# Patient Record
Sex: Male | Born: 1950 | Race: White | Hispanic: No | Smoking: Never smoker
Health system: Southern US, Community
[De-identification: ages and names within clinical notes are randomized; demographics above are authoritative.]

## PROBLEM LIST (undated history)

## (undated) ENCOUNTER — Inpatient Hospital Stay: Admission: EM | Payer: Self-pay | Source: Home / Self Care

## (undated) DIAGNOSIS — C4490 Unspecified malignant neoplasm of skin, unspecified: Secondary | ICD-10-CM

## (undated) DIAGNOSIS — Z87442 Personal history of urinary calculi: Secondary | ICD-10-CM

## (undated) DIAGNOSIS — E119 Type 2 diabetes mellitus without complications: Secondary | ICD-10-CM

## (undated) DIAGNOSIS — M1990 Unspecified osteoarthritis, unspecified site: Secondary | ICD-10-CM

## (undated) DIAGNOSIS — Z923 Personal history of irradiation: Secondary | ICD-10-CM

## (undated) DIAGNOSIS — M5126 Other intervertebral disc displacement, lumbar region: Secondary | ICD-10-CM

## (undated) DIAGNOSIS — I10 Essential (primary) hypertension: Secondary | ICD-10-CM

## (undated) DIAGNOSIS — K5792 Diverticulitis of intestine, part unspecified, without perforation or abscess without bleeding: Secondary | ICD-10-CM

## (undated) HISTORY — PX: OTHER SURGICAL HISTORY: SHX169

## (undated) HISTORY — DX: Personal history of irradiation: Z92.3

## (undated) HISTORY — PX: MOUTH SURGERY: SHX715

## (undated) NOTE — *Deleted (*Deleted)
HEMATOLOGY/ONCOLOGY CONSULTATION NOTE  Date of Service: 01/23/2020  Patient Care Team: Clovis Riley, L.August Saucer, MD as PCP - General (Family Medicine)  CHIEF COMPLAINTS/PURPOSE OF CONSULTATION:  Primary Testicular large B cell lymphoma   HISTORY OF PRESENTING ILLNESS:   Patrick Bautista is a wonderful 16 y.o. male who has been referred to [place] by Dr. Marlou Porch for evaluation and management of Diffuse Large B-Cell Lymphoma. Pt is accompanied today by his girlfriend, Patrick Bautista. The pt reports that he is doing well overall.    Pt received his second COVID19 vaccine on 02/13. Soon after he began having sweating in his groin, numbness in his left thigh, and numbness in two fingers on his left hand. After the symptoms began pt went to see his PCP who thought he had a hydrocele. He returned a month later, with continued symptoms, and received a testicular US which found a mass. Pt had a left Orchiectomy performed on 09/03/2019. The pt reports that he saw Dr. Marlou Porch this morning and has been healing well from his surgery.   His Diabetes has been relatively well-controlled with diet and Glimepiride. It has been many years since he has had concerns with Diverticulitis and last had kidney stones in 2017. He has previously been seen by a Nephrologist who felt that his kidney disease could have been partially caused by Cystoscopy with Dr. Marlou Porch in 2018. Pt has no history of lung or heart dysfunction or neuropathy.   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   Of note prior to the patient's visit today, pt has had Left Testicle and Spermatic Cord Surgical Pathology ([id number]) completed on 09/03/2019 with results revealing "Diffuse large B-cell lymphoma."   Pt has had CT C/A/P completed on 08/26/2019 with results revealing "1. Large left-sided testicular mass consistent with known testicular cancer. 2. No findings for metastatic disease involving the chest, abdomen, or pelvis.  3. A few small scattered retroperitoneal lymph nodes but no mass or overt adenopathy. "  Most recent lab results (09/01/2019) of CBC w/diff and CMP is as follows: all values are WNL except for Hgb at 12.7, Glucose at 177, BUN at 50, Creatinine at 1.91, GFR Est Non Af Am at 41.  On review of systems, pt reports anxiety, stress and denies neuropathy, right testicular pain/swelling, abdominal pain and any other symptoms.   On PMHx the pt reports HTN, Diabetes Type II, Diverticulitis, Arthritis, Lumbar herniated disc, Left Orchiectomy. On Social Hx the pt reports that he is a retired Insurance underwriter.   INTERVAL HISTORY:  Patrick Bautista is a wonderful 37 y.o. 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 The patient's last visit with [place] was on 01/02/2020. The pt reports that he is doing well overall.  The pt reports ***  Of note since the patient's last visit, pt has had *** completed on *** with results revealing ***.  Lab results today (01/23/20) of CBC w/diff and CMP is as follows: all values are WNL except for ***. 01/23/2020 Magnesium at ***  On review of systems, pt reports *** and denies ***and any other symptoms.   A&P: -Discussed pt labwork today, 01/23/20; *** -***  MEDICAL HISTORY:  Past Medical History:  Diagnosis Date  . Arthritis   . Diabetes mellitus without complication (HCC)    type 2  . Diverticulitis 15 yrs ago  . History of kidney stones    right ureteral stone and one in 2017  .  Hypertension   . Lumbar herniated disc    L 4 L 5     SURGICAL HISTORY: Past Surgical History:  Procedure Laterality Date  . colonscopy  15 yrs ago  . CYSTOSCOPY/URETEROSCOPY/HOLMIUM LASER/STENT PLACEMENT Right 02/16/2017   Procedure: CYSTOSCOPY/RETROGRADE/URETEROSCOPY/HOLMIUM LASER/STENT PLACEMENT;  Surgeon: Crist Fat, MD;  Location: [facility];  Service: Urology;  Laterality: Right;  NEEDS 60 MIN FOR PROCEDURE  . IR IMAGING GUIDED PORT  INSERTION  09/19/2019  . IR REMOVAL TUN ACCESS W/ PORT W/O FL MOD SED  01/19/2020  . MOUTH SURGERY  20 yrs ago  . ORCHIECTOMY Left 09/03/2019   Procedure: LEFT ORCHIECTOMY;  Surgeon: Crist Fat, MD;  Location: [facility];  Service: Urology;  Laterality: Left;    SOCIAL HISTORY: Social History   Socioeconomic History  . Marital status: Legally Separated    Spouse name: Not on file  . Number of children: Not on file  . Years of education: Not on file  . Highest education level: Not on file  Occupational History  . Not on file  Tobacco Use  . Smoking status: Never Smoker  . Smokeless tobacco: Never Used  Vaping Use  . Vaping Use: Never used  Substance and Sexual Activity  . Alcohol use: No  . Drug use: No  . Sexual activity: Not on file  Other Topics Concern  . Not on file  Social History Narrative   Pt states "Potential reaction after covid vaccination" States "Experienced left fingers numb and left inner thigh numb, left testicular sweating, all within 5 hours of vaccination".   Social Determinants of Health   Financial Resource Strain:   . Difficulty of Paying Living Expenses: Not on file  Food Insecurity:   . Worried About Programme researcher, broadcasting/film/video in the Last Year: Not on file  . Ran Out of Food in the Last Year: Not on file  Transportation Needs:   . Lack of Transportation (Medical): Not on file  . Lack of Transportation (Non-Medical): Not on file  Physical Activity:   . Days of Exercise per Week: Not on file  . Minutes of Exercise per Session: Not on file  Stress:   . Feeling of Stress : Not on file  Social Connections:   . Frequency of Communication with Friends and Family: Not on file  . Frequency of Social Gatherings with Friends and Family: Not on file  . Attends Religious Services: Not on file  . Active Member of Clubs or Organizations: Not on file  . Attends Banker Meetings: Not on file  . Marital Status: Not on file  Intimate Partner  Violence:   . Fear of Current or Ex-Partner: Not on file  . Emotionally Abused: Not on file  . Physically Abused: Not on file  . Sexually Abused: Not on file    FAMILY HISTORY: No family history on file.  ALLERGIES:  is allergic to hydrocodone, percocet [oxycodone-acetaminophen], codeine, penicillins, and tape.  MEDICATIONS:  Current Outpatient Medications  Medication Sig Dispense Refill  . acetaminophen (TYLENOL) 500 MG tablet Take 500-1,000 mg by mouth every 6 (six) hours as needed for moderate pain.     Marland Kitchen amLODipine (NORVASC) 2.5 MG tablet Take 1 tablet (2.5 mg total) by mouth daily. 2.5 mg + 5 mg =7.5 mg total daily dose    . amLODipine (NORVASC) 5 MG tablet Take 1 tablet (5 mg total) by mouth daily. 2.5 mg + 5 mg =7.5 mg total daily dose  11  .  B-D UF III MINI PEN NEEDLES 31G X 5 MM MISC Inject into the skin 4 (four) times daily.    . benzonatate (TESSALON) 100 MG capsule Take 1 capsule (100 mg total) by mouth 3 (three) times daily for 5 days. 15 capsule 0  . citalopram (CELEXA) 10 MG tablet TAKE 1 TABLET BY MOUTH EVERY DAY (Patient not taking: Reported on 01/16/2020) 90 tablet 1  . ergocalciferol (VITAMIN D2) 1.25 MG (50000 UT) capsule Take 1 capsule (50,000 Units total) by mouth 2 (two) times a week. 24 capsule 2  . fluocinonide cream (LIDEX) 0.05 % Apply 1 application topically 2 (two) times daily as needed (to access port).     Marland Kitchen guaiFENesin-dextromethorphan (ROBITUSSIN DM) 100-10 MG/5ML syrup Take 5 mLs by mouth every 4 (four) hours as needed for cough. 118 mL 0  . HUMALOG KWIKPEN 100 UNIT/ML KwikPen Inject 1-5 Units into the skin See admin instructions. Sliding scale : up to 4 times a day.    . lidocaine-prilocaine (EMLA) cream Apply to affected area once (Patient not taking: Reported on 01/16/2020) 30 g 3  . loratadine (CLARITIN) 10 MG tablet Take 1 tablet (10 mg total) by mouth See admin instructions. Patient states he takes for 7 days: from 3rd day post chemo until 10th day post  chemo (Patient taking differently: Take 10 mg by mouth daily. ) 28 tablet 0  . LORazepam (ATIVAN) 0.5 MG tablet TAKE 1 TABLET BY MOUTH EVERY 6 HOURS AS NEEDED (NAUSEA OR VOMITING). (Patient taking differently: Take 0.5 mg by mouth every 6 (six) hours as needed for anxiety. ) 30 tablet 0  . [START ON 01/28/2020] olmesartan-hydrochlorothiazide (BENICAR HCT) 40-25 MG tablet Take 1 tablet by mouth daily.    Letta Pate VERIO test strip 2 (two) times daily.    . pantoprazole (PROTONIX) 40 MG tablet TAKE 1 TABLET BY MOUTH DAILY BEFORE BREAKFAST (Patient taking differently: Take 40 mg by mouth daily. ) 90 tablet 1  . prochlorperazine (COMPAZINE) 10 MG tablet Take 1 tablet (10 mg total) by mouth every 6 (six) hours as needed (Nausea or vomiting). (Patient not taking: Reported on 01/16/2020) 30 tablet 6  . senna-docusate (SENNA S) 8.6-50 MG tablet Take 2 tablets by mouth at bedtime as needed for mild constipation or moderate constipation. 60 tablet 1  . TRESIBA FLEXTOUCH 100 UNIT/ML FlexTouch Pen Inject 40 Units into the skin daily.      No current facility-administered medications for this visit.    REVIEW OF SYSTEMS:   A 10+ POINT REVIEW OF SYSTEMS WAS OBTAINED including neurology, dermatology, psychiatry, cardiac, respiratory, lymph, extremities, GI, GU, Musculoskeletal, constitutional, breasts, reproductive, HEENT.  All pertinent positives are noted in the HPI.  All others are negative.    PHYSICAL EXAMINATION: ECOG PERFORMANCE STATUS: 0 - Asymptomatic  . There were no vitals filed for this visit. There were no vitals filed for this visit. .There is no height or weight on file to calculate BMI.   *** GENERAL:alert, in no acute distress and comfortable SKIN: no acute rashes, no significant lesions EYES: conjunctiva are pink and non-injected, sclera anicteric OROPHARYNX: MMM, no exudates, no oropharyngeal erythema or ulceration NECK: supple, no JVD LYMPH:  no palpable lymphadenopathy in the  cervical, axillary or inguinal regions LUNGS: clear to auscultation b/l with normal respiratory effort HEART: regular rate & rhythm ABDOMEN:  normoactive bowel sounds , non tender, not distended. No palpable hepatosplenomegaly.  Extremity: no pedal edema PSYCH: alert & oriented x 3 with fluent speech NEURO:  no focal motor/sensory deficits  LABORATORY DATA:  I have reviewed the data as listed  . CBC Latest Ref Rng & Units 01/21/2020 01/20/2020 01/19/2020  WBC 4.0 - 10.5 K/uL 9.1 8.5 9.3  Hemoglobin 13.0 - 17.0 g/dL 8.3(L) 8.4(L) 8.7(L)  Hematocrit 39 - 52 % 25.5(L) 26.0(L) 25.5(L)  Platelets 150 - 400 K/uL 457(H) 426(H) 349    . CMP Latest Ref Rng & Units 01/21/2020 01/20/2020 01/19/2020  Glucose 70 - 99 mg/dL 161(W) 960(A) 93  BUN 8 - 23 mg/dL 11 15 13   Creatinine 0.61 - 1.24 mg/dL 5.40 9.81 1.91  Sodium 135 - 145 mmol/L 135 133(L) 138  Potassium 3.5 - 5.1 mmol/L 3.8 3.6 3.9  Chloride 98 - 111 mmol/L 104 104 107  CO2 22 - 32 mmol/L 23 23 24   Calcium 8.9 - 10.3 mg/dL 4.7(W) 8.3(L) 8.5(L)  Total Protein 6.5 - 8.1 g/dL 2.9(F) 5.6(L) 5.6(L)  Total Bilirubin 0.3 - 1.2 mg/dL 0.3 0.4 0.4  Alkaline Phos 38 - 126 U/L 55 61 58  AST 15 - 41 U/L 42(H) 41 23  ALT 0 - 44 U/L 31 27 17     12/08/2019 Cytology Report ([id number]):   09/03/2019 Left Testicle and Spermatic Cord Surgical Pathology ([id number]) :    RADIOGRAPHIC STUDIES: I have personally reviewed the radiological images as listed and agreed with the findings in the report. DG Chest 2 View  Result Date: 01/16/2020 CLINICAL DATA:  Lymphoma with cough and recurrent fever EXAM: CHEST - 2 VIEW COMPARISON:  Chest CT 08/26/2019 FINDINGS: Normal heart size and mediastinal contours. Porta catheter on the right with tip at the SVC. There is no edema, consolidation, effusion, or pneumothorax. Spondylosis. IMPRESSION: No evidence of acute disease. Electronically Signed   By: Marnee Spring M.D.   On: 01/16/2020 10:17   IR REMOVAL  TUN ACCESS W/ PORT W/O FL MOD SED  Result Date: 01/19/2020 INDICATION: 58 year old male with a history of un known fever origin referred for port removal EXAM: IMPLANTED PORT A CATH PLACEMENT WITH ULTRASOUND AND FLUOROSCOPIC GUIDANCE MEDICATIONS: 1 g vancomycin; The antibiotic was administered within an appropriate time interval prior to skin puncture. ANESTHESIA/SEDATION: Moderate (conscious) sedation was employed during this procedure. A total of Versed 1.0 mg and Fentanyl 50 mcg was administered intravenously. Moderate Sedation Time: 10 minutes. The patient's level of consciousness and vital signs were monitored continuously by radiology nursing throughout the procedure under my direct supervision. FLUOROSCOPY TIME:  None COMPLICATIONS: None PROCEDURE: Informed consent was obtained from the patient following an explanation of the procedure, risks, benefits and alternatives. The patient understands, agrees and consents for the procedure. All questions were addressed. A time out was performed prior to the initiation of the procedure. The patient was positioned in the operation suite in the supine position on a gantry. The previous scar on the right chest was generously infiltrated with 1% lidocaine for local anesthesia. Infiltration of the skin and subcutaneous tissues surrounding the port was performed. Using sharp and blunt dissection, the port apparatus and subcutaneous catheter were removed in their entirety. There was no erythema or purulent fluid. Given the appearance, primary closure was elected. The port pocket was then closed with interrupted Vicryl layer and a running subcuticular with 4-0 Monocryl. The skin was sealed with Derma bond. A sterile dressing was placed. The patient tolerated the procedure well and remained hemodynamically stable throughout. No complications were encountered and no significant blood loss was encountered. IMPRESSION: Status post bedside right IJ port catheter removal. Signed,  Yvone Neu. Reyne Dumas, RPVI [facility] Radiology Electronically Signed   By: Gilmer Mor D.O.   On: 01/19/2020 15:47   DG CHEST PORT 1 VIEW  Result Date: 01/17/2020 CLINICAL DATA:  Shortness of breath. EXAM: PORTABLE CHEST 1 VIEW COMPARISON:  January 16, 2020 FINDINGS: Stable position of the injectable port. Cardiomediastinal silhouette is normal. Mediastinal contours appear intact. Subtle peribronchial airspace opacities with lower lobe predominance, left greater than right. Osseous structures are without acute abnormality. Soft tissues are grossly normal. IMPRESSION: Subtle peribronchial airspace opacities with lower lobe predominance, left greater than right may represent early or atypical pneumonia. Electronically Signed   By: Ted Mcalpine M.D.   On: 01/17/2020 20:12   DG Fluoro Guide Spinal/SI Jt Inj Left  Result Date: 12/26/2019 Florencia Reasons, MD     12/26/2019  2:56 PM Lumbar puncture performed at L3-L4 for intrathecal chemotherapy infusion.  12 mg methotrexate administered intrathecally.  No complications.  Please see dictation in PACS for additional details.   [place] CHEST SOFT TISSUE  Result Date: 01/17/2020 CLINICAL DATA:  Infection due to Port-A-Cath. EXAM: ULTRASOUND OF HEAD/NECK SOFT TISSUES TECHNIQUE: Ultrasound examination of the head and neck soft tissues was performed in the area of clinical concern. COMPARISON:  None. FINDINGS: Targeted sonographic evaluation in the area of clinical concern in the right chest, area around the port through the nipple were scanned. No evidence of fluid collection adjacent to the port or catheter. No significant skin thickening or edema. IMPRESSION: No evidence of fluid collection in the right chest wall related to chest port. Electronically Signed   By: Narda Rutherford M.D.   On: 01/17/2020 18:21   CT Maxillofacial Wo Contrast  Result Date: 01/16/2020 CLINICAL DATA:  Syncope with nasal trauma.  Sinus pressure, cough. History of B-cell lymphoma EXAM: CT MAXILLOFACIAL WITHOUT CONTRAST TECHNIQUE: Multidetector CT imaging of the maxillofacial structures was performed. Multiplanar CT image reconstructions were also generated. COMPARISON:  None. FINDINGS: Osseous: No acute maxillofacial bone fracture. Bony orbital walls intact. Mandible intact. Temporomandibular joints aligned without dislocation. Orbits: Negative. No traumatic or inflammatory finding. Sinuses: Paranasal sinuses are well aerated and clear. No mucosal thickening or air-fluid level. Pneumatization of the bilateral middle nasal turbinates. Bilateral mastoid air cells are clear. Soft tissues: No soft tissue swelling. No masses identified. No lacrimal gland enlargement. Limited intracranial: No significant or unexpected finding. IMPRESSION: 1. Negative for acute maxillofacial bone fracture. 2. Paranasal sinuses are well aerated and clear. Electronically Signed   By: Duanne Guess D.O.   On: 01/16/2020 12:43   DG FLUORO GUIDED LOC OF NEEDLE/CATH TIP FOR SPINAL INJECT LT  Result Date: 12/26/2019 CLINICAL DATA:  1 year old male with history of testicular large B-cell lymphoma. EXAM: DIAGNOSTIC LUMBAR PUNCTURE UNDER FLUOROSCOPIC GUIDANCE INTRATHECAL CHEMOTHERAPY INJECTION FLUOROSCOPY TIME:  Fluoroscopy Time:  1 minutes and 24 seconds Radiation Exposure Index (if provided by the fluoroscopic device): 13.6 mGy PROCEDURE: Informed consent was obtained from the patient prior to the procedure, including potential complications of headache, allergy, and pain. With the patient prone, the lower back was prepped with Betadine. 1% Lidocaine was used for local anesthesia. Lumbar puncture was performed at the L3-L4 level using a 20 gauge needle with return of clear CSF. No CSF was obtained for laboratory studies. 12 mg of methotrexate was injected intrathecally. The patient tolerated the procedure well and there were no apparent complications. IMPRESSION: 1.  Successful uncomplicated fluoroscopic guided lumbar puncture for intrathecal chemotherapy injection, as above. Electronically  Signed   By: Trudie Reed M.D.   On: 12/26/2019 14:43    ASSESSMENT & PLAN:   32 yo with   1) Newly diagnosed left primary testicular large B cell lymphoma 09/03/2019 Left Testicle and Spermatic Cord Surgical Pathology ([id number]) revealed "Diffuse large B-cell lymphoma."  08/26/2019 CT C/A/P revealed "1. Large left-sided testicular mass consistent with known testicular cancer. 2. No findings for metastatic disease involving the chest, abdomen, or pelvis. 3. A few small scattered retroperitoneal lymph nodes but no mass or overt adenopathy. " 09/30/2019 PET/CT ([id number]) revealed "1. Small right adrenal nodule exhibits intense tracer uptake with SUV max of 13.03. Compared with the study from 08/26/2019 this appears to represent a new finding. This nodule has mean Hounsfield units of 31.5 compared with mean Hounsfield units of 1.44 on 08/26/2019. Cannot rule out right adrenal gland involvement. 2. No FDG avid (Deauville criteria 3 or greater) lymph nodes or mass identified within the remainder of the neck, chest, abdomen or pelvis. 3. Aortic atherosclerosis, in addition to RCA and LAD coronary artery disease. Please note that although the presence of coronary artery calcium documents the presence of coronary artery disease, the severity of this disease and any potential stenosis cannot be assessed on this non-gated CT examination. Assessment for potential risk factor modification, dietary therapy or pharmacologic therapy may be warranted, if clinically indicated. Aortic Atherosclerosis (ICD10-I70.0)." 2) .Marland Kitchen Past Medical History:  Diagnosis Date  . Arthritis   . Diabetes mellitus without complication (HCC)    type 2  . Diverticulitis 15 yrs ago  . History of kidney stones    right ureteral stone and one in 2017  . Hypertension   . Lumbar herniated disc    L 4 L 5     3) CKD stage 3  PLAN: *** -Plan to complete up to 6 cycles of chemotherapy and 4 cycles of IT MTX for CNS prophylaxis.  -Advised pt that we would complete RT to his right testicle after the completion of chemotherapy to prevent recurrence of Testicular DLBCL. -Continue 50K Units Vitamin D twice per week -Will see back with ***   FOLLOW UP: ***   The total time spent in the appt was *** minutes and more than 50% was on counseling and direct patient cares.  All of the patient's questions were answered with apparent satisfaction. The patient knows to call the clinic with any problems, questions or concerns.   Wyvonnia Lora MD MS AAHIVMS [facility] Hematology/Oncology Physician [facility]  (Office):       [phone] (Work cell):  [phone] (Fax):           [phone]  01/23/2020 7:46 AM  I, Carollee Herter, am acting as a scribe for Dr. Wyvonnia Lora.   {Add Production assistant, radio Statement}

## (undated) NOTE — *Deleted (*Deleted)
PROGRESS NOTE    Patrick Bautista  ZOX:[id number] DOB: 1950-05-18 DOA: 01/16/2020 PCP: Clovis Riley, L.August Saucer, MD (Confirm with patient/family/NH records and if not entered, this HAS to be entered at [facility] point of entry. "No PCP" if truly none.)   Chief Complaint  Patient presents with  . Fever  . Cough  . Facial Pain  . Weakness    Brief Narrative: (Start on day 1 of progress note - keep it brief and live) ***   Assessment & Plan:   Principal Problem:   Sepsis (HCC) Active Problems:   Diffuse large B cell lymphoma (HCC)   Port-A-Cath in place   Pneumonia   Fever   Infection due to Port-A-Cath   ***   DVT prophylaxis: (Lovenox/Heparin/SCD's/anticoagulated/None (if comfort care) Code Status: (Full/Partial - specify details) Family Communication: (Specify name, relationship & date discussed. NO "discussed with patient") Disposition:   Status is: Inpatient  {Inpatient:23812}  Dispo: The patient is from: {From:23814}              Anticipated d/c is to: {To:23815}              Anticipated d/c date is: {Days:23816}              Patient currently {Medically stable:23817}       Consultants:   ***  Procedures: (Don't include imaging studies which can be auto populated. Include things that cannot be auto populated i.e. Echo, Carotid and venous dopplers, Foley, Bipap, HD, tubes/drains, wound vac, central lines etc)  ***  Antimicrobials: (specify start and planned stop date. Auto populated tables are space occupying and do not give end dates)  ***    Subjective: ***  Objective: Vitals:   01/20/20 1400 01/20/20 1700 01/20/20 2009 01/21/20 0512  BP: 109/63 121/65 118/77 132/75  Pulse: 78 71 82 72  Resp: 18 16 18 18   Temp: 98.6 F (37 C) 98.3 F (36.8 C) 98.3 F (36.8 C) 98.8 F (37.1 C)  TempSrc:  Oral    SpO2: 98% 95% 97% 96%  Weight:      Height:        Intake/Output Summary (Last 24 hours) at 01/21/2020 1052 Last data filed at 01/21/2020 0810 Gross per 24  hour  Intake 2728.62 ml  Output 1725 ml  Net 1003.62 ml   Filed Weights   01/16/20 1044 01/16/20 1351  Weight: 93.2 kg 93.2 kg    Examination:  General exam: Appears calm and comfortable  Respiratory system: Clear to auscultation. Respiratory effort normal. Cardiovascular system: S1 & S2 heard, RRR. No JVD, murmurs, rubs, gallops or clicks. No pedal edema. Gastrointestinal system: Abdomen is nondistended, soft and nontender. No organomegaly or masses felt. Normal bowel sounds heard. Central nervous system: Alert and oriented. No focal neurological deficits. Extremities: Symmetric 5 x 5 power. Skin: No rashes, lesions or ulcers Psychiatry: Judgement and insight appear normal. Mood & affect appropriate.     Data Reviewed: I have personally reviewed following labs and imaging studies  CBC: Recent Labs  Lab 01/16/20 1108 01/16/20 1108 01/18/20 0341 01/18/20 2327 01/19/20 0630 01/20/20 0524 01/21/20 0437  WBC 11.6*   < > 10.6* 11.3* 9.3 8.5 9.1  NEUTROABS 9.0*  --   --  9.3*  --   --   --   HGB 8.1*   < > 6.3* 8.4* 8.7* 8.4* 8.3*  HCT 23.7*   < > 18.9* 25.5* 25.5* 26.0* 25.5*  MCV 94.0   < > 95.0 90.7 91.4 93.2 93.1  PLT 271   < > 294 335 349 426* 457*   < > = values in this interval not displayed.    Basic Metabolic Panel: Recent Labs  Lab 01/16/20 1108 01/18/20 0341 01/19/20 0630 01/20/20 0524 01/21/20 0437  NA 135 138 138 133* 135  K 3.5 3.7 3.9 3.6 3.8  CL 98 105 107 104 104  CO2 24 23 24 23 23   GLUCOSE 258* 96 93 160* 150*  BUN 18 14 13 15 11   CREATININE 1.38* 1.25* 1.20 1.04 0.99  CALCIUM 9.4 8.3* 8.5* 8.3* 8.4*    GFR: Estimated Creatinine Clearance: 79.6 mL/min (by C-G formula based on SCr of 0.99 mg/dL).  Liver Function Tests: Recent Labs  Lab 01/16/20 1108 01/18/20 0341 01/19/20 0630 01/20/20 0524 01/21/20 0437  AST 20 18 23  41 42*  ALT 20 18 17 27 31   ALKPHOS 88 62 58 61 55  BILITOT 0.1* 0.3 0.4 0.4 0.3  PROT 6.7 5.4* 5.6* 5.6* 5.4*   ALBUMIN 3.4* 2.5* 2.6* 2.6* 2.5*    CBG: Recent Labs  Lab 01/19/20 1140 01/19/20 1704 01/19/20 2110 01/20/20 0720 01/20/20 1122  GLUCAP 80 97 192* 139* 187*     Recent Results (from the past 240 hour(s))  Respiratory Panel by RT PCR (Flu A&B, Covid) - Nasopharyngeal Swab     Status: None   Collection Time: 01/16/20 11:09 AM   Specimen: Nasopharyngeal Swab  Result Value Ref Range Status   SARS Coronavirus 2 by RT PCR NEGATIVE NEGATIVE Final    Comment: (NOTE) SARS-CoV-2 target nucleic acids are NOT DETECTED.  The SARS-CoV-2 RNA is generally detectable in upper respiratoy specimens during the acute phase of infection. The lowest concentration of SARS-CoV-2 viral copies this assay can detect is 131 copies/mL. A negative result does not preclude SARS-Cov-2 infection and should not be used as the sole basis for treatment or other patient management decisions. A negative result may occur with  improper specimen collection/handling, submission of specimen other than nasopharyngeal swab, presence of viral mutation(s) within the areas targeted by this assay, and inadequate number of viral copies (<131 copies/mL). A negative result must be combined with clinical observations, patient history, and epidemiological information. The expected result is Negative.  Fact Sheet for Patients:  https://www.moore.com/  Fact Sheet for Healthcare Providers:  https://www.young.biz/  This test is no t yet approved or cleared by the [place] FDA and  has been authorized for detection and/or diagnosis of SARS-CoV-2 by FDA under an Emergency Use Authorization (EUA). This EUA will remain  in effect (meaning this test can be used) for the duration of the COVID-19 declaration under Section 564(b)(1) of the Act, 21 U.S.C. section 360bbb-3(b)(1), unless the authorization is terminated or revoked sooner.     Influenza A by PCR NEGATIVE NEGATIVE Final    Influenza B by PCR NEGATIVE NEGATIVE Final    Comment: (NOTE) The Xpert Xpress SARS-CoV-2/FLU/RSV assay is intended as an aid in  the diagnosis of influenza from Nasopharyngeal swab specimens and  should not be used as a sole basis for treatment. Nasal washings and  aspirates are unacceptable for Xpert Xpress SARS-CoV-2/FLU/RSV  testing.  Fact Sheet for Patients: https://www.moore.com/  Fact Sheet for Healthcare Providers: https://www.young.biz/  This test is not yet approved or cleared by the [place] FDA and  has been authorized for detection and/or diagnosis of SARS-CoV-2 by  FDA under an Emergency Use Authorization (EUA). This EUA will remain  in effect (meaning this test can be used)  for the duration of the  Covid-19 declaration under Section 564(b)(1) of the Act, 21  U.S.C. section 360bbb-3(b)(1), unless the authorization is  terminated or revoked. Performed at [facility], [address], [place], [place] [address]   Blood Culture (routine x 2)     Status: None   Collection Time: 01/16/20 11:13 AM   Specimen: BLOOD  Result Value Ref Range Status   Specimen Description   Final    BLOOD LEFT WRIST Performed at [facility], [address] [place], [place] [address]    Special Requests   Final    BOTTLES DRAWN AEROBIC AND ANAEROBIC Blood Culture adequate volume Performed at [facility], [address], [place], [place] [address]    Culture   Final    NO GROWTH 5 DAYS Performed at [facility], [address], [place], [place] [address]    Report Status 01/21/2020 FINAL  Final  Urine culture     Status: None   Collection Time: 01/16/20 11:21 AM   Specimen: In/Out Cath Urine  Result Value Ref Range Status   Specimen Description   Final    IN/OUT CATH URINE Performed at [facility], [address], [place], [place] [address]    Special  Requests   Final    NONE Performed at [facility], [address] [place], [place] [address]    Culture   Final    NO GROWTH Performed at [facility], [address] [place], [place] [address]    Report Status 01/17/2020 FINAL  Final  Blood Culture (routine x 2)     Status: None   Collection Time: 01/16/20 11:35 AM   Specimen: BLOOD  Result Value Ref Range Status   Specimen Description   Final    BLOOD RIGHT ANTECUBITAL Performed at [facility], [address] [place], [place] [address]    Special Requests   Final    BOTTLES DRAWN AEROBIC AND ANAEROBIC Blood Culture results may not be optimal due to an excessive volume of blood received in culture bottles Performed at [facility], [address] [place], [place] [address]    Culture   Final    NO GROWTH 5 DAYS Performed at [facility], [address] [place], [place] [address]    Report Status 01/21/2020 FINAL  Final  Respiratory Panel by PCR     Status: None   Collection Time: 01/16/20  4:06 PM   Specimen: Nasopharyngeal Swab; Respiratory  Result Value Ref Range Status   Adenovirus NOT DETECTED NOT DETECTED Final   Coronavirus 229E NOT DETECTED NOT DETECTED Final    Comment: (NOTE) The Coronavirus on the Respiratory Panel, DOES NOT test for the novel  Coronavirus (2019 nCoV)    Coronavirus HKU1 NOT DETECTED NOT DETECTED Final   Coronavirus NL63 NOT DETECTED NOT DETECTED Final   Coronavirus OC43 NOT DETECTED NOT DETECTED Final   Metapneumovirus NOT DETECTED NOT DETECTED Final   Rhinovirus / Enterovirus NOT DETECTED NOT DETECTED Final   Influenza A NOT DETECTED NOT DETECTED Final   Influenza B NOT DETECTED NOT DETECTED Final   Parainfluenza Virus 1 NOT DETECTED NOT DETECTED Final   Parainfluenza Virus 2 NOT DETECTED NOT DETECTED Final   Parainfluenza Virus 3 NOT DETECTED NOT DETECTED Final   Parainfluenza Virus 4 NOT DETECTED NOT DETECTED Final    Respiratory Syncytial Virus NOT DETECTED NOT DETECTED  Final   Bordetella pertussis NOT DETECTED NOT DETECTED Final   Chlamydophila pneumoniae NOT DETECTED NOT DETECTED Final   Mycoplasma pneumoniae NOT DETECTED NOT DETECTED Final    Comment: Performed at [facility], [address], [place], [place] [address]  Culture, blood (single)     Status: None (Preliminary result)   Collection Time: 01/17/20 10:41 AM   Specimen: BLOOD  Result Value Ref Range Status   Specimen Description   Final    BLOOD LEFT ANTECUBITAL Performed at [facility], [address] [place], [place] [address]    Special Requests   Final    BOTTLES DRAWN AEROBIC AND ANAEROBIC Blood Culture adequate volume Performed at [facility], [address], [place], [place] [address]    Culture   Final    NO GROWTH 4 DAYS Performed at [facility], [address] [place], [place] [address]    Report Status PENDING  Incomplete  Culture, blood (routine x 2)     Status: None (Preliminary result)   Collection Time: 01/17/20  2:56 PM   Specimen: BLOOD  Result Value Ref Range Status   Specimen Description   Final    BLOOD PORTA CATH Performed at [facility], [address] [place], [place] [address]    Special Requests   Final    BOTTLES DRAWN AEROBIC AND ANAEROBIC Blood Culture adequate volume Performed at [facility], [address] [place], [place] [address]    Culture   Final    NO GROWTH 4 DAYS Performed at [facility], [address] [place], [place] [address]    Report Status PENDING  Incomplete  MRSA PCR Screening     Status: None   Collection Time: 01/19/20  9:36 AM   Specimen: Nasal Mucosa; Nasopharyngeal  Result Value Ref Range Status   MRSA by PCR NEGATIVE NEGATIVE Final    Comment:        The GeneXpert MRSA Assay (FDA approved for NASAL specimens only), is one component of a comprehensive MRSA  colonization surveillance program. It is not intended to diagnose MRSA infection nor to guide or monitor treatment for MRSA infections. Performed at [facility], [address], [place], [place] [address]   Cath Tip Culture     Status: None (Preliminary result)   Collection Time: 01/19/20  3:00 PM   Specimen: Catheter Tip; Other  Result Value Ref Range Status   Specimen Description   Final    CATH TIP Performed at [facility], [address] [place], [place] [address]    Special Requests   Final    NONE Performed at [facility], [address], [place], [place] [address]    Culture   Final    NO GROWTH 2 DAYS Performed at [facility], [address] [place], [place] [address]    Report Status PENDING  Incomplete         Radiology Studies: IR REMOVAL TUN ACCESS W/ PORT W/O FL MOD SED  Result Date: 01/19/2020 INDICATION: 51 year old male with a history of un known fever origin referred for port removal EXAM: IMPLANTED PORT A CATH PLACEMENT WITH ULTRASOUND AND FLUOROSCOPIC GUIDANCE MEDICATIONS: 1 g vancomycin; The antibiotic was administered within an appropriate time interval prior to skin puncture. ANESTHESIA/SEDATION: Moderate (conscious) sedation was employed during this procedure. A total of Versed 1.0 mg and Fentanyl 50 mcg was administered  intravenously. Moderate Sedation Time: 10 minutes. The patient's level of consciousness and vital signs were monitored continuously by radiology nursing throughout the procedure under my direct supervision. FLUOROSCOPY TIME:  None COMPLICATIONS: None PROCEDURE: Informed consent was obtained from the patient following an explanation of the procedure, risks, benefits and alternatives. The patient understands, agrees and consents for the procedure. All questions were addressed. A time out was performed prior to the initiation of the procedure. The patient was positioned in the  operation suite in the supine position on a gantry. The previous scar on the right chest was generously infiltrated with 1% lidocaine for local anesthesia. Infiltration of the skin and subcutaneous tissues surrounding the port was performed. Using sharp and blunt dissection, the port apparatus and subcutaneous catheter were removed in their entirety. There was no erythema or purulent fluid. Given the appearance, primary closure was elected. The port pocket was then closed with interrupted Vicryl layer and a running subcuticular with 4-0 Monocryl. The skin was sealed with Derma bond. A sterile dressing was placed. The patient tolerated the procedure well and remained hemodynamically stable throughout. No complications were encountered and no significant blood loss was encountered. IMPRESSION: Status post bedside right IJ port catheter removal. Signed, Yvone Neu. Reyne Dumas, RPVI [facility] Radiology Electronically Signed   By: Gilmer Mor D.O.   On: 01/19/2020 15:47        Scheduled Meds: . benzonatate  100 mg Oral TID  . heparin  5,000 Units Subcutaneous Q8H  . insulin aspart  0-15 Units Subcutaneous TID WC  . insulin aspart  0-5 Units Subcutaneous QHS  . insulin glargine  35 Units Subcutaneous Daily  . pantoprazole  40 mg Oral Daily  . sodium chloride flush  3 mL Intravenous Q12H   Continuous Infusions: . dextrose 5 % and 0.9% NaCl 75 mL/hr at 01/21/20 0543     LOS: 5 days    Time spent: ***    Ramiro Harvest, MD [facility]   To contact the attending provider between 7A-7P or the covering provider during after hours 7P-7A, please log into the web site [email or web address] and access using universal [place] password for that web site. If you do not have the password, please call the hospital operator.  01/21/2020, 10:52 AM

---

## 2006-03-07 ENCOUNTER — Emergency Department (HOSPITAL_COMMUNITY): Admission: EM | Admit: 2006-03-07 | Discharge: 2006-03-08 | Payer: Self-pay | Admitting: Emergency Medicine

## 2013-01-30 ENCOUNTER — Other Ambulatory Visit: Payer: Self-pay | Admitting: *Deleted

## 2013-01-30 ENCOUNTER — Other Ambulatory Visit: Payer: Self-pay | Admitting: Physical Medicine and Rehabilitation

## 2013-01-30 DIAGNOSIS — M545 Low back pain, unspecified: Secondary | ICD-10-CM

## 2013-02-05 ENCOUNTER — Other Ambulatory Visit: Payer: Self-pay

## 2014-10-05 DIAGNOSIS — H16141 Punctate keratitis, right eye: Secondary | ICD-10-CM | POA: Diagnosis not present

## 2014-10-05 DIAGNOSIS — H43811 Vitreous degeneration, right eye: Secondary | ICD-10-CM | POA: Diagnosis not present

## 2014-12-01 DIAGNOSIS — E1165 Type 2 diabetes mellitus with hyperglycemia: Secondary | ICD-10-CM | POA: Diagnosis not present

## 2014-12-01 DIAGNOSIS — E780 Pure hypercholesterolemia: Secondary | ICD-10-CM | POA: Diagnosis not present

## 2014-12-02 DIAGNOSIS — I10 Essential (primary) hypertension: Secondary | ICD-10-CM | POA: Diagnosis not present

## 2014-12-02 DIAGNOSIS — E1165 Type 2 diabetes mellitus with hyperglycemia: Secondary | ICD-10-CM | POA: Diagnosis not present

## 2015-02-04 DIAGNOSIS — L570 Actinic keratosis: Secondary | ICD-10-CM | POA: Diagnosis not present

## 2015-06-04 DIAGNOSIS — I10 Essential (primary) hypertension: Secondary | ICD-10-CM | POA: Diagnosis not present

## 2015-06-04 DIAGNOSIS — E1165 Type 2 diabetes mellitus with hyperglycemia: Secondary | ICD-10-CM | POA: Diagnosis not present

## 2015-06-04 DIAGNOSIS — E7800 Pure hypercholesterolemia, unspecified: Secondary | ICD-10-CM | POA: Diagnosis not present

## 2015-09-27 DIAGNOSIS — L570 Actinic keratosis: Secondary | ICD-10-CM | POA: Diagnosis not present

## 2015-09-27 DIAGNOSIS — L821 Other seborrheic keratosis: Secondary | ICD-10-CM | POA: Diagnosis not present

## 2015-11-04 ENCOUNTER — Observation Stay (HOSPITAL_BASED_OUTPATIENT_CLINIC_OR_DEPARTMENT_OTHER): Payer: Medicare Other

## 2015-11-04 ENCOUNTER — Observation Stay (HOSPITAL_BASED_OUTPATIENT_CLINIC_OR_DEPARTMENT_OTHER)
Admission: EM | Admit: 2015-11-04 | Discharge: 2015-11-07 | Disposition: A | Discharge status: Home / Self Care | Payer: Medicare Other | Attending: Internal Medicine | Admitting: Internal Medicine

## 2015-11-04 ENCOUNTER — Encounter (HOSPITAL_BASED_OUTPATIENT_CLINIC_OR_DEPARTMENT_OTHER): Payer: Self-pay | Admitting: Emergency Medicine

## 2015-11-04 DIAGNOSIS — Z880 Allergy status to penicillin: Secondary | ICD-10-CM | POA: Insufficient documentation

## 2015-11-04 DIAGNOSIS — K5730 Diverticulosis of large intestine without perforation or abscess without bleeding: Secondary | ICD-10-CM | POA: Diagnosis not present

## 2015-11-04 DIAGNOSIS — I700 Atherosclerosis of aorta: Secondary | ICD-10-CM | POA: Insufficient documentation

## 2015-11-04 DIAGNOSIS — D649 Anemia, unspecified: Secondary | ICD-10-CM | POA: Insufficient documentation

## 2015-11-04 DIAGNOSIS — N10 Acute tubulo-interstitial nephritis: Secondary | ICD-10-CM | POA: Diagnosis not present

## 2015-11-04 DIAGNOSIS — Z885 Allergy status to narcotic agent status: Secondary | ICD-10-CM | POA: Diagnosis not present

## 2015-11-04 DIAGNOSIS — K649 Unspecified hemorrhoids: Secondary | ICD-10-CM | POA: Insufficient documentation

## 2015-11-04 DIAGNOSIS — K5792 Diverticulitis of intestine, part unspecified, without perforation or abscess without bleeding: Secondary | ICD-10-CM | POA: Diagnosis present

## 2015-11-04 DIAGNOSIS — N179 Acute kidney failure, unspecified: Principal | ICD-10-CM | POA: Insufficient documentation

## 2015-11-04 DIAGNOSIS — J069 Acute upper respiratory infection, unspecified: Secondary | ICD-10-CM | POA: Insufficient documentation

## 2015-11-04 DIAGNOSIS — I10 Essential (primary) hypertension: Secondary | ICD-10-CM | POA: Diagnosis not present

## 2015-11-04 DIAGNOSIS — Z888 Allergy status to other drugs, medicaments and biological substances status: Secondary | ICD-10-CM | POA: Insufficient documentation

## 2015-11-04 DIAGNOSIS — E119 Type 2 diabetes mellitus without complications: Secondary | ICD-10-CM | POA: Diagnosis not present

## 2015-11-04 DIAGNOSIS — N200 Calculus of kidney: Secondary | ICD-10-CM | POA: Insufficient documentation

## 2015-11-04 HISTORY — DX: Essential (primary) hypertension: I10

## 2015-11-04 HISTORY — DX: Type 2 diabetes mellitus without complications: E11.9

## 2015-11-04 LAB — CBC WITH DIFFERENTIAL/PLATELET
Basophils Absolute: 0 10*3/uL (ref 0.0–0.1)
Basophils Relative: 0 %
Eosinophils Absolute: 0.1 10*3/uL (ref 0.0–0.7)
Eosinophils Relative: 1 %
HCT: 37.7 % — ABNORMAL LOW (ref 39.0–52.0)
Hemoglobin: 12.8 g/dL — ABNORMAL LOW (ref 13.0–17.0)
Lymphocytes Relative: 21 %
Lymphs Abs: 2.4 10*3/uL (ref 0.7–4.0)
MCH: 29 pg (ref 26.0–34.0)
MCHC: 34 g/dL (ref 30.0–36.0)
MCV: 85.5 fL (ref 78.0–100.0)
Monocytes Absolute: 1.5 10*3/uL — ABNORMAL HIGH (ref 0.1–1.0)
Monocytes Relative: 13 %
Neutro Abs: 7.5 10*3/uL (ref 1.7–7.7)
Neutrophils Relative %: 65 %
Platelets: 246 10*3/uL (ref 150–400)
RBC: 4.41 MIL/uL (ref 4.22–5.81)
RDW: 13.2 % (ref 11.5–15.5)
WBC: 11.6 10*3/uL — ABNORMAL HIGH (ref 4.0–10.5)

## 2015-11-04 LAB — COMPREHENSIVE METABOLIC PANEL
ALT: 35 U/L (ref 17–63)
AST: 29 U/L (ref 15–41)
Albumin: 4.3 g/dL (ref 3.5–5.0)
Alkaline Phosphatase: 66 U/L (ref 38–126)
Anion gap: 8 (ref 5–15)
BUN: 38 mg/dL — ABNORMAL HIGH (ref 6–20)
CO2: 25 mmol/L (ref 22–32)
Calcium: 10.2 mg/dL (ref 8.9–10.3)
Chloride: 102 mmol/L (ref 101–111)
Creatinine, Ser: 1.93 mg/dL — ABNORMAL HIGH (ref 0.61–1.24)
GFR calc Af Amer: 40 mL/min — ABNORMAL LOW (ref >60)
GFR calc non Af Amer: 35 mL/min — ABNORMAL LOW (ref >60)
Glucose, Bld: 137 mg/dL — ABNORMAL HIGH (ref 65–99)
Potassium: 4.2 mmol/L (ref 3.5–5.1)
Sodium: 135 mmol/L (ref 135–145)
Total Bilirubin: 0.5 mg/dL (ref 0.3–1.2)
Total Protein: 7.4 g/dL (ref 6.5–8.1)

## 2015-11-04 LAB — URINALYSIS, ROUTINE W REFLEX MICROSCOPIC
Bilirubin Urine: NEGATIVE
Glucose, UA: NEGATIVE mg/dL
Hgb urine dipstick: NEGATIVE
Ketones, ur: 15 mg/dL — AB
Leukocytes, UA: NEGATIVE
Nitrite: NEGATIVE
Protein, ur: NEGATIVE mg/dL
Specific Gravity, Urine: 1.02 (ref 1.005–1.030)
pH: 5 (ref 5.0–8.0)

## 2015-11-04 LAB — LIPASE, BLOOD: Lipase: 12 U/L (ref 11–51)

## 2015-11-04 MED ORDER — SODIUM CHLORIDE 0.9 % IV BOLUS (SEPSIS)
1000.0000 mL | Freq: Once | INTRAVENOUS | Status: AC
  Administered 2015-11-04: 1000 mL via INTRAVENOUS

## 2015-11-04 MED ORDER — FENTANYL CITRATE (PF) 100 MCG/2ML IJ SOLN
50.0000 ug | INTRAMUSCULAR | Status: DC | PRN
  Administered 2015-11-04 – 2015-11-05 (×2): 50 ug via INTRAVENOUS
  Filled 2015-11-04 (×3): qty 2

## 2015-11-04 MED ORDER — SODIUM CHLORIDE 0.9 % IV BOLUS (SEPSIS)
1000.0000 mL | Freq: Once | INTRAVENOUS | Status: AC
Start: 2015-11-04 — End: 2015-11-05
  Administered 2015-11-04: 1000 mL via INTRAVENOUS

## 2015-11-04 MED ORDER — ONDANSETRON HCL 4 MG/2ML IJ SOLN
4.0000 mg | INTRAMUSCULAR | Status: DC | PRN
  Administered 2015-11-04: 4 mg via INTRAVENOUS
  Filled 2015-11-04: qty 2

## 2015-11-04 NOTE — ED Notes (Signed)
Lab being drawn by Richardson Landry, RT

## 2015-11-04 NOTE — ED Provider Notes (Signed)
CSN: [id number]     Arrival date & time 11/04/15  1953 History    By signing my name below, I, Maud Deed. Royston Sinner, attest that this documentation has been prepared under the direction and in the presence of Leo Grosser, MD.  Electronically Signed: Maud Deed. Royston Sinner, ED Scribe. 11/04/2015. 8:31 PM.   Chief Complaint  Patient presents with  . Abdominal Pain   The history is provided by the patient. No language interpreter was used.    HPI Comments: Malyki Lechtenberg is a 65 y.o. male with a PMHx of hemorrhoids and diverticulitis who presents to the Emergency Department complaining of constant, unchanged lower abdominal pain x 1 day. He also reports ongoing back pain and 2-3 loose stools. 1 episode of mild blood noted on toilet paper prior to arrival. No aggravating or alleviating factors reported. No OTC medications or home remedies attempted prior to arrival. No recent fever, chills, nausea, vomiting, or diarrhea. Pt states he picked 7 bushels of beans yesterday and mowed his lawn.  PCP: No primary care provider on file.    Past Medical History  Diagnosis Date  . Diabetes mellitus without complication ([place])   . Hypertension    History reviewed. No pertinent past surgical history. History reviewed. No pertinent family history. Social History  Substance Use Topics  . Smoking status: Never Smoker   . Smokeless tobacco: None  . Alcohol Use: No    Review of Systems  Constitutional: Negative for fever and chills.  Gastrointestinal: Positive for abdominal pain and anal bleeding.  All other systems reviewed and are negative.     Allergies  Codeine and Penicillins  Home Medications   Prior to Admission medications   Medication Sig Start Date End Date Taking? Authorizing Provider  amLODipine (NORVASC) 2.5 MG tablet Take 7.5 mg by mouth daily.   Yes Historical Provider, MD  losartan-hydrochlorothiazide (HYZAAR) 100-25 MG tablet Take 1 tablet by mouth daily.   Yes Historical Provider, MD   metFORMIN (GLUCOPHAGE) 500 MG tablet Take 1,000 mg by mouth 2 (two) times daily with a meal.   Yes Historical Provider, MD    Physical Exam  Constitutional: He is oriented to person, place, and time. He appears well-developed and well-nourished.  HENT:  Head: Normocephalic.  Eyes: EOM are normal.  Neck: Normal range of motion.  Cardiovascular: Normal rate and regular rhythm.   Pulmonary/Chest: Effort normal.  Abdominal: Soft. He exhibits no distension. There is no tenderness. There is no rebound and no guarding.  Musculoskeletal: Normal range of motion.  Neurological: He is alert and oriented to person, place, and time.  Psychiatric: He has a normal mood and affect.  Nursing note and vitals reviewed.     Triage Vitals: BP 137/79 mmHg  Pulse 86  Temp(Src) 98.9 F (37.2 C) (Oral)  Resp 20  Ht 5\' 11"  (1.803 m)  Wt 212 lb (96.163 kg)  BMI 29.58 kg/m2  SpO2 100%   ED Course  Procedures (including critical care time)  DIAGNOSTIC STUDIES: Oxygen Saturation is 100% on RA, Normal by my interpretation.    COORDINATION OF CARE: 8:30 PM- Will order blood work. Will give GI cocktail. Discussed treatment plan with pt at bedside and pt agreed to plan.     Labs Review Labs Reviewed  CBC WITH DIFFERENTIAL/PLATELET - Abnormal; Notable for the following:    WBC 11.6 (*)    Hemoglobin 12.8 (*)    HCT 37.7 (*)    Monocytes Absolute 1.5 (*)    All  other components within normal limits  COMPREHENSIVE METABOLIC PANEL - Abnormal; Notable for the following:    Glucose, Bld 137 (*)    BUN 38 (*)    Creatinine, Ser 1.93 (*)    GFR calc non Af Amer 35 (*)    GFR calc Af Amer 40 (*)    All other components within normal limits  URINALYSIS, ROUTINE W REFLEX MICROSCOPIC (NOT AT [facility]) - Abnormal; Notable for the following:    Ketones, ur 15 (*)    All other components within normal limits  BASIC METABOLIC PANEL - Abnormal; Notable for the following:    Glucose, Bld 136 (*)    BUN 33 (*)     Creatinine, Ser 1.84 (*)    GFR calc non Af Amer 37 (*)    GFR calc Af Amer 42 (*)    All other components within normal limits  COMPREHENSIVE METABOLIC PANEL - Abnormal; Notable for the following:    Sodium 132 (*)    Chloride 98 (*)    Glucose, Bld 156 (*)    BUN 28 (*)    Creatinine, Ser 1.92 (*)    GFR calc non Af Amer 35 (*)    GFR calc Af Amer 40 (*)    All other components within normal limits  GLUCOSE, CAPILLARY - Abnormal; Notable for the following:    Glucose-Capillary 148 (*)    All other components within normal limits  GLUCOSE, CAPILLARY - Abnormal; Notable for the following:    Glucose-Capillary 137 (*)    All other components within normal limits  CULTURE, BLOOD (ROUTINE X 2)  CULTURE, BLOOD (ROUTINE X 2)  LIPASE, BLOOD  MAGNESIUM  TSH  HEMOGLOBIN A1C    Imaging Review Ct Abdomen Pelvis Wo Contrast  11/04/2015  CLINICAL DATA:  Pain in the lower abdomen, low back, and right flank for 1 day. Hemorrhoid pain this week. No trauma or surgery. No IV contrast material due to elevated creatinine and decreased GFR. EXAM: CT ABDOMEN AND PELVIS WITHOUT CONTRAST TECHNIQUE: Multidetector CT imaging of the abdomen and pelvis was performed following the standard protocol without IV contrast. COMPARISON:  03/08/2006 FINDINGS: The lung bases are clear. Coronary artery calcifications. Right kidney demonstrates a nonobstructing stone in the upper pole. No hydronephrosis or hydroureter. There is stranding around the right kidney and right ureter. This could represent residual changes from recently passed stone or may indicate pyelonephritis. Left kidney and ureter are unremarkable. Bladder wall is not thickened. The unenhanced appearance of the liver, spleen, gallbladder, pancreas, adrenal glands, inferior vena cava, and retroperitoneal lymph nodes is unremarkable. Scattered calcification of the abdominal aorta. The stomach, small bowel, and colon are not abnormally distended. Contrast  material flows through to the colon without evidence of bowel obstruction. No bowel wall thickening is appreciated. Scattered diverticula throughout the colon without evidence of inflammation. No free air or free fluid in the abdomen. Pelvis: Appendix is normal. Diverticulosis of the sigmoid colon without evidence of diverticulitis. Prostate gland is not enlarged. No free or loculated pelvic fluid collections. No pelvic mass or lymphadenopathy. Degenerative changes in the spine and hips. No destructive bone lesions. 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 IMPRESSION: Stranding around the right kidney and ureter without hydronephrosis. This could represent residual changes from recently passed stone or could indicate pyelonephritis. Nonobstructing stone in the upper pole right kidney. Electronically Signed   By: Lucienne Capers M.D.   On: 11/04/2015 23:32   I have personally reviewed and evaluated these  images and lab results as part of my medical decision-making.   EKG Interpretation None      MDM   Final diagnoses:  AKI (acute kidney injury) ([place])    65 y.o. male presents with lower abdominal pain and c/o resolving hemorrhoid. Screening labs concerning for presumed declining renal function. Possible this is pre-renal in setting of dehydration and heat exposure yesterday. Discussed d/c and recheck of labs vs admission for completion of workup and Pt agreed to admission. Hospitalist was consulted for admission and accepted the patient in transfer to facility capable of caring for patient further.   I personally performed the services described in this documentation, which was scribed in my presence. The recorded information has been reviewed and is accurate.    Leo Grosser, MD 11/05/15 1515

## 2015-11-04 NOTE — ED Notes (Signed)
MD at bedside. 

## 2015-11-04 NOTE — ED Notes (Signed)
patient states that he has had some pain with Hemorid earlier this week. The patient reports that he is having lower abdominal pain and back pain

## 2015-11-04 NOTE — Plan of Care (Signed)
65 year old male with history of hypertension and diabetes mellitus presents with complaints of abdominal pain and loose stools. Creatinine is around 1.9. Old labs not available. CT abdomen and pelvis with oral contrast is pending. Patient will be admitted for hydration and further management of abdominal pain.  Patrick Bautista

## 2015-11-05 DIAGNOSIS — K5792 Diverticulitis of intestine, part unspecified, without perforation or abscess without bleeding: Secondary | ICD-10-CM | POA: Diagnosis present

## 2015-11-05 DIAGNOSIS — N10 Acute tubulo-interstitial nephritis: Secondary | ICD-10-CM | POA: Diagnosis not present

## 2015-11-05 DIAGNOSIS — N179 Acute kidney failure, unspecified: Secondary | ICD-10-CM | POA: Diagnosis not present

## 2015-11-05 LAB — BASIC METABOLIC PANEL
Anion gap: 8 (ref 5–15)
BUN: 33 mg/dL — ABNORMAL HIGH (ref 6–20)
CO2: 23 mmol/L (ref 22–32)
Calcium: 9.3 mg/dL (ref 8.9–10.3)
Chloride: 104 mmol/L (ref 101–111)
Creatinine, Ser: 1.84 mg/dL — ABNORMAL HIGH (ref 0.61–1.24)
GFR calc Af Amer: 42 mL/min — ABNORMAL LOW (ref >60)
GFR calc non Af Amer: 37 mL/min — ABNORMAL LOW (ref >60)
Glucose, Bld: 136 mg/dL — ABNORMAL HIGH (ref 65–99)
Potassium: 4.3 mmol/L (ref 3.5–5.1)
Sodium: 135 mmol/L (ref 135–145)

## 2015-11-05 LAB — COMPREHENSIVE METABOLIC PANEL
ALT: 31 U/L (ref 17–63)
AST: 29 U/L (ref 15–41)
Albumin: 3.6 g/dL (ref 3.5–5.0)
Alkaline Phosphatase: 67 U/L (ref 38–126)
Anion gap: 10 (ref 5–15)
BUN: 28 mg/dL — ABNORMAL HIGH (ref 6–20)
CO2: 24 mmol/L (ref 22–32)
Calcium: 9.3 mg/dL (ref 8.9–10.3)
Chloride: 98 mmol/L — ABNORMAL LOW (ref 101–111)
Creatinine, Ser: 1.92 mg/dL — ABNORMAL HIGH (ref 0.61–1.24)
GFR calc Af Amer: 40 mL/min — ABNORMAL LOW (ref >60)
GFR calc non Af Amer: 35 mL/min — ABNORMAL LOW (ref >60)
Glucose, Bld: 156 mg/dL — ABNORMAL HIGH (ref 65–99)
Potassium: 4.2 mmol/L (ref 3.5–5.1)
Sodium: 132 mmol/L — ABNORMAL LOW (ref 135–145)
Total Bilirubin: 0.5 mg/dL (ref 0.3–1.2)
Total Protein: 6.7 g/dL (ref 6.5–8.1)

## 2015-11-05 LAB — GLUCOSE, CAPILLARY
Glucose-Capillary: 148 mg/dL — ABNORMAL HIGH (ref 65–99)
Glucose-Capillary: 137 mg/dL — ABNORMAL HIGH (ref 65–99)
Glucose-Capillary: 179 mg/dL — ABNORMAL HIGH (ref 65–99)
Glucose-Capillary: 162 mg/dL — ABNORMAL HIGH (ref 65–99)

## 2015-11-05 LAB — TSH: TSH: 3.858 u[IU]/mL (ref 0.350–4.500)

## 2015-11-05 LAB — MAGNESIUM: Magnesium: 1.9 mg/dL (ref 1.7–2.4)

## 2015-11-05 MED ORDER — ONDANSETRON HCL 4 MG/2ML IJ SOLN
4.0000 mg | Freq: Four times a day (QID) | INTRAMUSCULAR | Status: DC | PRN
  Administered 2015-11-05: 4 mg via INTRAVENOUS
  Filled 2015-11-05: qty 2

## 2015-11-05 MED ORDER — ENOXAPARIN SODIUM 40 MG/0.4ML ~~LOC~~ SOLN
40.0000 mg | SUBCUTANEOUS | Status: DC
  Administered 2015-11-05 – 2015-11-06 (×2): 40 mg via SUBCUTANEOUS
  Filled 2015-11-05 (×2): qty 0.4

## 2015-11-05 MED ORDER — SODIUM CHLORIDE 0.9% FLUSH
3.0000 mL | Freq: Two times a day (BID) | INTRAVENOUS | Status: DC
  Administered 2015-11-05 – 2015-11-07 (×2): 3 mL via INTRAVENOUS

## 2015-11-05 MED ORDER — ACETAMINOPHEN 650 MG RE SUPP: 650.0000 mg | Freq: Four times a day (QID) | RECTAL | Status: DC | PRN

## 2015-11-05 MED ORDER — DEXTROSE 5 % IV SOLN
2.0000 g | INTRAVENOUS | Status: DC
  Administered 2015-11-05 – 2015-11-06 (×2): 2 g via INTRAVENOUS
  Filled 2015-11-05 (×2): qty 2

## 2015-11-05 MED ORDER — HYDROMORPHONE HCL 1 MG/ML IJ SOLN
1.0000 mg | INTRAMUSCULAR | Status: DC | PRN
  Administered 2015-11-05: 1 mg via INTRAVENOUS
  Filled 2015-11-05: qty 1

## 2015-11-05 MED ORDER — ONDANSETRON HCL 4 MG PO TABS: 4.0000 mg | ORAL_TABLET | Freq: Four times a day (QID) | ORAL | Status: DC | PRN

## 2015-11-05 MED ORDER — SODIUM CHLORIDE 0.9 % IV SOLN
INTRAVENOUS | Status: DC
Start: 2015-11-05 — End: 2015-11-06
  Administered 2015-11-05 (×2): via INTRAVENOUS

## 2015-11-05 MED ORDER — OXYCODONE-ACETAMINOPHEN 5-325 MG PO TABS
1.0000 | ORAL_TABLET | ORAL | Status: DC | PRN
  Administered 2015-11-05 – 2015-11-06 (×4): 1 via ORAL
  Filled 2015-11-05 (×5): qty 1

## 2015-11-05 MED ORDER — ACETAMINOPHEN 325 MG PO TABS: 650.0000 mg | ORAL_TABLET | Freq: Four times a day (QID) | ORAL | Status: DC | PRN

## 2015-11-05 MED ORDER — LORATADINE 10 MG PO TABS
10.0000 mg | ORAL_TABLET | Freq: Every day | ORAL | Status: DC
  Administered 2015-11-05 – 2015-11-07 (×3): 10 mg via ORAL
  Filled 2015-11-05 (×3): qty 1

## 2015-11-05 MED ORDER — LEVALBUTEROL HCL 0.63 MG/3ML IN NEBU: 0.6300 mg | INHALATION_SOLUTION | Freq: Four times a day (QID) | RESPIRATORY_TRACT | Status: DC | PRN

## 2015-11-05 MED ORDER — SALINE SPRAY 0.65 % NA SOLN
1.0000 | NASAL | Status: DC | PRN
  Administered 2015-11-05: 1 via NASAL
  Filled 2015-11-05: qty 44

## 2015-11-05 MED ORDER — SENNOSIDES-DOCUSATE SODIUM 8.6-50 MG PO TABS: 1.0000 | ORAL_TABLET | Freq: Every evening | ORAL | Status: DC | PRN

## 2015-11-05 MED ORDER — INSULIN ASPART 100 UNIT/ML ~~LOC~~ SOLN
0.0000 [IU] | Freq: Three times a day (TID) | SUBCUTANEOUS | Status: DC
Start: 2015-11-05 — End: 2015-11-07
  Administered 2015-11-05: 2 [IU] via SUBCUTANEOUS
  Administered 2015-11-05 (×2): 1 [IU] via SUBCUTANEOUS
  Administered 2015-11-06: 2 [IU] via SUBCUTANEOUS
  Administered 2015-11-06 – 2015-11-07 (×2): 1 [IU] via SUBCUTANEOUS

## 2015-11-05 MED ORDER — AMLODIPINE BESYLATE 5 MG PO TABS
7.5000 mg | ORAL_TABLET | Freq: Every day | ORAL | Status: DC
  Administered 2015-11-05 – 2015-11-07 (×3): 7.5 mg via ORAL
  Filled 2015-11-05 (×3): qty 1

## 2015-11-05 MED ORDER — OXYCODONE-ACETAMINOPHEN 5-325 MG PO TABS
1.0000 | ORAL_TABLET | Freq: Once | ORAL | Status: AC
  Administered 2015-11-05: 1 via ORAL
  Filled 2015-11-05: qty 1

## 2015-11-05 NOTE — Care Management CC44 (Signed)
Condition Code 44 Documentation Completed  Patient Details  Name: Patrick Bautista MRN: [id number] Date of Birth: 11-05-1950   Condition Code 44 given:  Yes Patient signature on Condition Code 44 notice:  Yes Documentation of 2 MD's agreement:  Yes Code 44 added to claim:  Yes    Carles Collet, RN 11/05/2015, 3:36 PM

## 2015-11-05 NOTE — H&P (Signed)
[facility] History and Physical  Patrick Bautista [id number] DOB: [birth year] DOA: 11/04/2015  Referring physician:   PCP: No PCP Per Patient   Chief Complaint: Abdominal pain  HPI:  65 year old male with a past medical history of hemorrhoids and diverticulitis presents to   emergency room due to 1 day history of diffuse abdominal pain and diarrhea. Patient started having diarrhea 2 days ago, up to 4 bowel movements a day, nonbloody. Subsequently developed bilateral flank pain and low-grade fever. Patient has had intermittent bleeding from his rectal area due to his history of hemorrhoids. Over the last day or so patient has also developed some nasal congestion, sore throat, fullness in his ears. In the ER patient was found to have a creatinine of 1.93, baseline unknown, white count of 11.6, UA negative, CT abdomen pelvis without contrast showed stranding around the right kidney and ureter without hydronephrosis.      Review of Systems: negative for the following  Constitutional: Positive for low-grade fever, chills, diaphoresis, appetite change and fatigue.  HEENT: Denies photophobia, eye pain, redness, hearing loss, ear pain, congestion, sore throat, rhinorrhea, sneezing, mouth sores, trouble swallowing, neck pain, neck stiffness and tinnitus.  Respiratory: Denies SOB, DOE, cough, chest tightness, and wheezing.  Cardiovascular: Denies chest pain, palpitations and leg swelling.  Gastrointestinal: Denies nausea, vomiting, positive for abdominal pain, diarrhea, denies constipation, blood in stool and abdominal distention.  Genitourinary: Denies dysuria, urgency, frequency, hematuria, flank pain and difficulty urinating.  Musculoskeletal: Denies myalgias, back pain, joint swelling, arthralgias and gait problem.  Skin: Denies pallor, rash and wound.  Neurological: Denies dizziness, seizures, syncope, weakness, light-headedness, numbness and headaches.  Hematological: Denies adenopathy.  Easy bruising, personal or family bleeding history  Psychiatric/Behavioral: Denies suicidal ideation, mood changes, confusion, nervousness, sleep disturbance and agitation       Past Medical History  Diagnosis Date  . Diabetes mellitus without complication ([place])   . Hypertension      History reviewed. No pertinent past surgical history.    Social History:  reports that he has never smoked. He does not have any smokeless tobacco history on file. He reports that he does not drink alcohol or use illicit drugs.    Allergies  Allergen Reactions  . Codeine Itching  . Penicillins Itching        FAMILY HISTORY  When questioned  Directly-patient reports  No family history of HTN, CVA ,DIABETES, TB, Cancer CAD, Bleeding Disorders, Sickle Cell, diabetes, anemia, asthma,   Prior to Admission medications   Medication Sig Start Date End Date Taking? Authorizing Provider  amLODipine (NORVASC) 2.5 MG tablet Take 7.5 mg by mouth daily.   Yes Historical Provider, MD  losartan-hydrochlorothiazide (HYZAAR) 100-25 MG tablet Take 1 tablet by mouth daily.   Yes Historical Provider, MD  metFORMIN (GLUCOPHAGE) 500 MG tablet Take 1,000 mg by mouth 2 (two) times daily with a meal.   Yes Historical Provider, MD     Physical Exam: Filed Vitals:   11/05/15 0100 11/05/15 0220 11/05/15 0300 11/05/15 0515  BP: 108/65 126/84 121/69 120/75  Pulse: 64  63 69  Temp:    98 F (36.7 C)  TempSrc:      Resp: 14 15 16 18   Height:      Weight:      SpO2: 96%  96% 97%      Constitutional: NAD, calm, comfortable Filed Vitals:   11/05/15 0100 11/05/15 0220 11/05/15 0300 11/05/15 0515  BP: 108/65 126/84 121/69 120/75  Pulse: 64  63 69  Temp:    98 F (36.7 C)  TempSrc:      Resp: 14 15 16 18   Height:      Weight:      SpO2: 96%  96% 97%   Eyes: PERRL, lids and conjunctivae normal ENMT: Mucous membranes are moist. Posterior pharynx clear of any exudate or lesions.Normal dentition.  Neck:  normal, supple, no masses, no thyromegaly Respiratory: clear to auscultation bilaterally, no wheezing, no crackles. Normal respiratory effort. No accessory muscle use.  Cardiovascular: Regular rate and rhythm, no murmurs / rubs / gallops. No extremity edema. 2+ pedal pulses. No carotid bruits.  Abdomen: Diffuse mild tenderness, no masses palpated. No hepatosplenomegaly. Bowel sounds positive.  Musculoskeletal: no clubbing / cyanosis. No joint deformity upper and lower extremities. Good ROM, no contractures. Normal muscle tone.  Skin: no rashes, lesions, ulcers. No induration Neurologic: CN 2-12 grossly intact. Sensation intact, DTR normal. Strength 5/5 in all 4.  Psychiatric: Normal judgment and insight. Alert and oriented x 3. Normal mood.     Labs on Admission: I have personally reviewed following labs and imaging studies  CBC:  Recent Labs Lab 11/04/15 2040  WBC 11.6*  NEUTROABS 7.5  HGB 12.8*  HCT 37.7*  MCV 85.5  PLT 0000000    Basic Metabolic Panel:  Recent Labs Lab 11/04/15 2040 11/05/15 0127  NA 135 135  K 4.2 4.3  CL 102 104  CO2 25 23  GLUCOSE 137* 136*  BUN 38* 33*  CREATININE 1.93* 1.84*  CALCIUM 10.2 9.3    GFR: Estimated Creatinine Clearance: 46.8 mL/min (by C-G formula based on Cr of 1.84).  Liver Function Tests:  Recent Labs Lab 11/04/15 2040  AST 29  ALT 35  ALKPHOS 66  BILITOT 0.5  PROT 7.4  ALBUMIN 4.3    Recent Labs Lab 11/04/15 2040  LIPASE 12   No results for input(s): AMMONIA in the last 168 hours.  Coagulation Profile: No results for input(s): INR, PROTIME in the last 168 hours. No results for input(s): DDIMER in the last 72 hours.  Cardiac Enzymes: No results for input(s): CKTOTAL, CKMB, CKMBINDEX, TROPONINI in the last 168 hours.  BNP (last 3 results) No results for input(s): PROBNP in the last 8760 hours.  HbA1C: No results for input(s): HGBA1C in the last 72 hours. No results found for: HGBA1C   CBG: No results  for input(s): GLUCAP in the last 168 hours.  Lipid Profile: No results for input(s): CHOL, HDL, LDLCALC, TRIG, CHOLHDL, LDLDIRECT in the last 72 hours.  Thyroid Function Tests: No results for input(s): TSH, T4TOTAL, FREET4, T3FREE, THYROIDAB in the last 72 hours.  Anemia Panel: No results for input(s): VITAMINB12, FOLATE, FERRITIN, TIBC, IRON, RETICCTPCT in the last 72 hours.  Urine analysis:    Component Value Date/Time   COLORURINE YELLOW 11/04/2015 2049   APPEARANCEUR CLEAR 11/04/2015 2049   LABSPEC 1.020 11/04/2015 2049   PHURINE 5.0 11/04/2015 2049   GLUCOSEU NEGATIVE 11/04/2015 2049   HGBUR NEGATIVE 11/04/2015 2049   BILIRUBINUR NEGATIVE 11/04/2015 2049   KETONESUR 15* 11/04/2015 2049   PROTEINUR NEGATIVE 11/04/2015 2049   NITRITE NEGATIVE 11/04/2015 2049   LEUKOCYTESUR NEGATIVE 11/04/2015 2049    Sepsis Labs: @LABRCNTIP (procalcitonin:4,lacticidven:4) )No results found for this or any previous visit (from the past 240 hour(s)).       Radiological Exams on Admission: Ct Abdomen Pelvis Wo Contrast  11/04/2015  CLINICAL DATA:  Pain in the lower abdomen, low back, and right flank for  1 day. Hemorrhoid pain this week. No trauma or surgery. No IV contrast material due to elevated creatinine and decreased GFR. EXAM: CT ABDOMEN AND PELVIS WITHOUT CONTRAST TECHNIQUE: Multidetector CT imaging of the abdomen and pelvis was performed following the standard protocol without IV contrast. COMPARISON:  03/08/2006 FINDINGS: The lung bases are clear. Coronary artery calcifications. Right kidney demonstrates a nonobstructing stone in the upper pole. No hydronephrosis or hydroureter. There is stranding around the right kidney and right ureter. This could represent residual changes from recently passed stone or may indicate pyelonephritis. Left kidney and ureter are unremarkable. Bladder wall is not thickened. The unenhanced appearance of the liver, spleen, gallbladder, pancreas, adrenal  glands, inferior vena cava, and retroperitoneal lymph nodes is unremarkable. Scattered calcification of the abdominal aorta. The stomach, small bowel, and colon are not abnormally distended. Contrast material flows through to the colon without evidence of bowel obstruction. No bowel wall thickening is appreciated. Scattered diverticula throughout the colon without evidence of inflammation. No free air or free fluid in the abdomen. Pelvis: Appendix is normal. Diverticulosis of the sigmoid colon without evidence of diverticulitis. Prostate gland is not enlarged. No free or loculated pelvic fluid collections. No pelvic mass or lymphadenopathy. Degenerative changes in the spine and hips. No destructive bone lesions. 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 IMPRESSION: Stranding around the right kidney and ureter without hydronephrosis. This could represent residual changes from recently passed stone or could indicate pyelonephritis. Nonobstructing stone in the upper pole right kidney. Electronically Signed   By: Lucienne Capers M.D.   On: 11/04/2015 23:32   Ct Abdomen Pelvis Wo Contrast  11/04/2015  CLINICAL DATA:  Pain in the lower abdomen, low back, and right flank for 1 day. Hemorrhoid pain this week. No trauma or surgery. No IV contrast material due to elevated creatinine and decreased GFR. EXAM: CT ABDOMEN AND PELVIS WITHOUT CONTRAST TECHNIQUE: Multidetector CT imaging of the abdomen and pelvis was performed following the standard protocol without IV contrast. COMPARISON:  03/08/2006 FINDINGS: The lung bases are clear. Coronary artery calcifications. Right kidney demonstrates a nonobstructing stone in the upper pole. No hydronephrosis or hydroureter. There is stranding around the right kidney and right ureter. This could represent residual changes from recently passed stone or may indicate pyelonephritis. Left kidney and ureter are unremarkable. Bladder wall is not thickened. The unenhanced  appearance of the liver, spleen, gallbladder, pancreas, adrenal glands, inferior vena cava, and retroperitoneal lymph nodes is unremarkable. Scattered calcification of the abdominal aorta. The stomach, small bowel, and colon are not abnormally distended. Contrast material flows through to the colon without evidence of bowel obstruction. No bowel wall thickening is appreciated. Scattered diverticula throughout the colon without evidence of inflammation. No free air or free fluid in the abdomen. Pelvis: Appendix is normal. Diverticulosis of the sigmoid colon without evidence of diverticulitis. Prostate gland is not enlarged. No free or loculated pelvic fluid collections. No pelvic mass or lymphadenopathy. Degenerative changes in the spine and hips. No destructive bone lesions. 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 IMPRESSION: Stranding around the right kidney and ureter without hydronephrosis. This could represent residual changes from recently passed stone or could indicate pyelonephritis. Nonobstructing stone in the upper pole right kidney. Electronically Signed   By: Lucienne Capers M.D.   On: 11/04/2015 23:32         Assessment/Plan Principal Problem:   AKI (acute kidney injury) ([place]) Baseline creatinine unknown, this is likely in the setting of obstructive uropathy and passage of a  stone/pyelonephritis in the right kidney/ACE inhibitor We'll start patient on IV hydration, follow serial BMP Obtain baseline labs from Dr. Nash Dimmer  office his endocrinologist  Diabetes mellitus, type II, patient is on metformin   hold metformin, started patient on sliding scale insulin, check hemoglobin A1c      Acute pyelonephritis Start patient on Rocephin, no obstructive uropathy No urology consult indicated Follow blood culture results,  Viral URI Will obtain chest x-ray and start patient on Claritin and nausea nasal spray         DVT prophylaxis: Lovenox     Code Status Orders  Full code        Family Communication: discussed with patient  By the bedside   Disposition Plan:  Anticipate discharge in 2-3 days pending further workup and clinical progress  Consults called: None  Admission status: Inpatient  Total time spent 55 minutes.Greater than 50% of this time was spent in counseling, explanation of diagnosis, planning of further management, and coordination of care  [facility] MD [facility] Pager [phone]  If 7PM-7AM, please contact night-coverage [email or web address] Password [facility]  11/05/2015, 8:44 AM

## 2015-11-05 NOTE — Progress Notes (Addendum)
Dr. Almetta Lovely office contacted and made aware that we need patient's lab results. Medical Release Form faxed. Office stated they will work on it and fax results back to unit 5W.  10:10 AM Lab Results obtained. Placed in patient's shadow chart.    12/01/14 Creatinine 1.0, BUN 23 01/05/14 Creatinine 1.4 BUN 30 09/03/13 Creatinine 1.3 BUN 27

## 2015-11-05 NOTE — Progress Notes (Signed)
NURSING PROGRESS NOTE  Patrick Bautista [id number] Admission Data: 11/05/2015 6:09 AM Attending Provider: Rise Patience, MD PCP:No PCP Per Patient Code Status: FULL  Allergies:  Codeine and Penicillins Past Medical History:   has a past medical history of Diabetes mellitus without complication ([place]) and Hypertension. Past Surgical History:   has no past surgical history on file. Social History:   reports that he has never smoked. He does not have any smokeless tobacco history on file. He reports that he does not drink alcohol or use illicit drugs.  Patrick Bautista is a 65 y.o. male patient admitted from ED:   Last Documented Vital Signs: Blood pressure 120/75, pulse 69, temperature 98 F (36.7 C), temperature source Oral, resp. rate 18, height 5\' 11"  (1.803 m), weight 96.163 kg (212 lb), SpO2 97 %.  Cardiac Monitoring: Box # 02 in place. Cardiac monitor yields:normal sinus rhythm.  IV Fluids:  IV in place, occlusive dsg intact without redness, IV cath antecubital left, condition patent and no redness none.   Skin: Intact and appropriate for ethnicity with various bug bites on legs and one on left lower back.  Patient orientated to room. Information packet given to patient. Admission inpatient armband information verified with patient to include name and date of birth and placed on patient arm. Side rails up x 2, fall assessment and education completed with patient. Patient able to verbalize understanding of risk associated with falls and verbalized understanding to call for assistance before getting out of bed. Call light within reach. Patient able to voice and demonstrate understanding of unit orientation instructions.    Will continue to evaluate and treat per MD orders.  Clydell Hakim RN, BSN

## 2015-11-05 NOTE — Progress Notes (Signed)
Patient asking for "same pill medication that he got last night". Patient received a one time order of percocet. Patient stated "that worked better than this IV stuff". Dr. Allyson Sabal paged to make aware.

## 2015-11-05 NOTE — ED Notes (Signed)
Report given to Jeanie Sewer, RN

## 2015-11-05 NOTE — ED Notes (Signed)
MD notified that pt request pain medication.

## 2015-11-05 NOTE — Progress Notes (Signed)
Received report from [place] ED RN, Velna Hatchet.

## 2015-11-06 DIAGNOSIS — N179 Acute kidney failure, unspecified: Secondary | ICD-10-CM

## 2015-11-06 DIAGNOSIS — N200 Calculus of kidney: Secondary | ICD-10-CM | POA: Diagnosis not present

## 2015-11-06 LAB — COMPREHENSIVE METABOLIC PANEL
ALT: 23 U/L (ref 17–63)
AST: 20 U/L (ref 15–41)
Albumin: 3 g/dL — ABNORMAL LOW (ref 3.5–5.0)
Alkaline Phosphatase: 52 U/L (ref 38–126)
Anion gap: 7 (ref 5–15)
BUN: 17 mg/dL (ref 6–20)
CO2: 29 mmol/L (ref 22–32)
Calcium: 9 mg/dL (ref 8.9–10.3)
Chloride: 104 mmol/L (ref 101–111)
Creatinine, Ser: 1.48 mg/dL — ABNORMAL HIGH (ref 0.61–1.24)
GFR calc Af Amer: 55 mL/min — ABNORMAL LOW (ref >60)
GFR calc non Af Amer: 48 mL/min — ABNORMAL LOW (ref >60)
Glucose, Bld: 134 mg/dL — ABNORMAL HIGH (ref 65–99)
Potassium: 4 mmol/L (ref 3.5–5.1)
Sodium: 140 mmol/L (ref 135–145)
Total Bilirubin: 0.5 mg/dL (ref 0.3–1.2)
Total Protein: 6 g/dL — ABNORMAL LOW (ref 6.5–8.1)

## 2015-11-06 LAB — CBC
HCT: 34 % — ABNORMAL LOW (ref 39.0–52.0)
Hemoglobin: 11 g/dL — ABNORMAL LOW (ref 13.0–17.0)
MCH: 28.5 pg (ref 26.0–34.0)
MCHC: 32.4 g/dL (ref 30.0–36.0)
MCV: 88.1 fL (ref 78.0–100.0)
Platelets: 209 10*3/uL (ref 150–400)
RBC: 3.86 MIL/uL — ABNORMAL LOW (ref 4.22–5.81)
RDW: 13.7 % (ref 11.5–15.5)
WBC: 7.5 10*3/uL (ref 4.0–10.5)

## 2015-11-06 LAB — GLUCOSE, CAPILLARY
Glucose-Capillary: 146 mg/dL — ABNORMAL HIGH (ref 65–99)
Glucose-Capillary: 109 mg/dL — ABNORMAL HIGH (ref 65–99)
Glucose-Capillary: 155 mg/dL — ABNORMAL HIGH (ref 65–99)
Glucose-Capillary: 149 mg/dL — ABNORMAL HIGH (ref 65–99)

## 2015-11-06 LAB — HEMOGLOBIN A1C
Hgb A1c MFr Bld: 8.4 % — ABNORMAL HIGH (ref 4.8–5.6)
Mean Plasma Glucose: 194 mg/dL

## 2015-11-06 MED ORDER — SODIUM CHLORIDE 0.9 % IV SOLN
INTRAVENOUS | Status: AC
Start: 2015-11-06 — End: 2015-11-07
  Administered 2015-11-06 – 2015-11-07 (×2): via INTRAVENOUS

## 2015-11-06 MED ORDER — OXYCODONE-ACETAMINOPHEN 7.5-325 MG PO TABS
1.0000 | ORAL_TABLET | Freq: Three times a day (TID) | ORAL | Status: DC | PRN
  Administered 2015-11-06: 1 via ORAL
  Filled 2015-11-06: qty 1

## 2015-11-06 MED ORDER — ACETAMINOPHEN 325 MG PO TABS: 650.0000 mg | ORAL_TABLET | Freq: Four times a day (QID) | ORAL | Status: DC | PRN

## 2015-11-06 NOTE — Progress Notes (Addendum)
PROGRESS NOTE  Patrick Bautista  [id number] DOB: 1950-05-10  DOA: 11/04/2015 PCP: Donnie Coffin, MD  Primary Eendocrinologist: Caryl Comes  Brief Narrative:  65 year old male with PMH of DM 2, HTN, hemorrhoids, diverticulitis, presented to [facility] ED on 11/04/15 with initial diffuse abdominal pain, diarrhea, intermittent mild rectal bleeding due to hemorrhoids, subsequently developed right flank pain and low-grade fevers. CT abdomen showed stranding around right kidney and ureter. Admitted for acute kidney injury and presumed acute pyelonephritis.  Assessment & Plan:   Principal Problem:   AKI (acute kidney injury) ([place]) Active Problems:   Acute pyelonephritis   Acute diverticulitis   Acute kidney injury - Unclear etiology.? Prerenal due to intravascular volume depletion + ARB/HCTZ.. -Baseline creatinine not known. - Presented with creatinine of 1.9. Hydrated with IV fluids and creatinine has improved to 1.48. - CT abdomen without hydronephrosis.  Presumed acute pyelonephritis versus passed kidney stone - Patient denies urinary symptoms. CT abdomen and pelvis showed stranding around the right kidney and ureter without hydronephrosis which could represent residual changes from recently passed stone or could indicate pyelonephritis. Nonobstructing stone in the upper pole of the right kidney. -  Urine microscopy and an unimpressive without bacteriuria or pyuria. Blood cultures 2: Pending. Unfortunately urine cultures were never sent. - DC IV Rocephin and follow clinically. His presentation may have been mostly from kidney stone in the absence of urinary symptoms, [place] urine microscopy no significant leukocytosis or fever.  Type II DM - Reasonable inpatient control on SSI. Home metformin held.  Essential hypertension - Controlled. Continue amlodipine. Holding ARB/HCTZ.  Viral URI - Improved and symptoms seem to have resolved.  Anemia - May be dilutional. Follow CBC in a.m.   DVT  prophylaxis: Lovenox  Code Status: Full  Family Communication: Discussed with patient. No family at bedside.  Disposition Plan: DC home possibly 7/15.  Consultants:   None   Procedures:   None   Antimicrobials:   IV Rocephin 7/14 > 7/15    Subjective: Feels much better. Denies complaints. No right flank or abdominal pain. No nausea or vomiting. Denies ever having dysuria, urinary frequency, blood in urine or fevers. No penile discharge. No prior history of kidney stones. Gives family history of kidney stones.  Objective:  Filed Vitals:   11/05/15 0515 11/05/15 1327 11/05/15 2041 11/06/15 0459  BP: 120/75 131/63 124/62 118/68  Pulse: 69 71 71 73  Temp: 98 F (36.7 C) 97.5 F (36.4 C) 98.2 F (36.8 C) 98.9 F (37.2 C)  TempSrc:  Oral Oral Oral  Resp: 18 20 19 19   Height:      Weight:      SpO2: 97% 99% 98% 98%    Intake/Output Summary (Last 24 hours) at 11/06/15 1245 Last data filed at 11/06/15 1218  Gross per 24 hour  Intake 1547.92 ml  Output   2450 ml  Net -902.08 ml   Filed Weights   11/04/15 1959  Weight: 96.163 kg (212 lb)    Examination:  General exam: Pleasant middle-aged male lying comfortably supine in bed. Does not appear septic or toxic.  Respiratory system: Clear to auscultation. Respiratory effort normal. Cardiovascular system: S1 & S2 heard, RRR. No JVD, murmurs, rubs, gallops or clicks. No pedal edema.Telemetry: Sinus rhythm with first-degree AV block.  Gastrointestinal system: Abdomen is nondistended, soft and nontender. No organomegaly or masses felt. Normal bowel sounds heard.No renal angle tenderness.  Central nervous system: Alert and oriented. No focal neurological deficits. Extremities: Symmetric 5 x 5 power. Skin:  No rashes, lesions or ulcers Psychiatry: Judgement and insight appear normal. Mood & affect appropriate.     Data Reviewed: I have personally reviewed following labs and imaging studies  CBC:  Recent Labs Lab  11/04/15 2040 11/06/15 0555  WBC 11.6* 7.5  NEUTROABS 7.5  --   HGB 12.8* 11.0*  HCT 37.7* 34.0*  MCV 85.5 88.1  PLT 246 [id number]   Basic Metabolic Panel:  Recent Labs Lab 11/04/15 2040 11/05/15 0127 11/05/15 0753 11/06/15 0555  NA 135 135 132* 140  K 4.2 4.3 4.2 4.0  CL 102 104 98* 104  CO2 25 23 24 29   GLUCOSE 137* 136* 156* 134*  BUN 38* 33* 28* 17  CREATININE 1.93* 1.84* 1.92* 1.48*  CALCIUM 10.2 9.3 9.3 9.0  MG  --   --  1.9  --    GFR: Estimated Creatinine Clearance: 58.1 mL/min (by C-G formula based on Cr of 1.48). Liver Function Tests:  Recent Labs Lab 11/04/15 2040 11/05/15 0753 11/06/15 0555  AST 29 29 20   ALT 35 31 23  ALKPHOS 66 67 52  BILITOT 0.5 0.5 0.5  PROT 7.4 6.7 6.0*  ALBUMIN 4.3 3.6 3.0*    Recent Labs Lab 11/04/15 2040  LIPASE 12   No results for input(s): AMMONIA in the last 168 hours. Coagulation Profile: No results for input(s): INR, PROTIME in the last 168 hours. Cardiac Enzymes: No results for input(s): CKTOTAL, CKMB, CKMBINDEX, TROPONINI in the last 168 hours. BNP (last 3 results) No results for input(s): PROBNP in the last 8760 hours. HbA1C: No results for input(s): HGBA1C in the last 72 hours. CBG:  Recent Labs Lab 11/05/15 1132 11/05/15 1702 11/05/15 2043 11/06/15 0735 11/06/15 1137  GLUCAP 137* 179* 162* 146* 109*   Lipid Profile: No results for input(s): CHOL, HDL, LDLCALC, TRIG, CHOLHDL, LDLDIRECT in the last 72 hours. Thyroid Function Tests:  Recent Labs  11/05/15 0753  TSH 3.858   Anemia Panel: No results for input(s): VITAMINB12, FOLATE, FERRITIN, TIBC, IRON, RETICCTPCT in the last 72 hours.  Sepsis Labs: No results for input(s): PROCALCITON, LATICACIDVEN in the last 168 hours.  No results found for this or any previous visit (from the past 240 hour(s)).       Radiology Studies: Ct Abdomen Pelvis Wo Contrast  11/04/2015  CLINICAL DATA:  Pain in the lower abdomen, low back, and right flank for 1  day. Hemorrhoid pain this week. No trauma or surgery. No IV contrast material due to elevated creatinine and decreased GFR. EXAM: CT ABDOMEN AND PELVIS WITHOUT CONTRAST TECHNIQUE: Multidetector CT imaging of the abdomen and pelvis was performed following the standard protocol without IV contrast. COMPARISON:  03/08/2006 FINDINGS: The lung bases are clear. Coronary artery calcifications. Right kidney demonstrates a nonobstructing stone in the upper pole. No hydronephrosis or hydroureter. There is stranding around the right kidney and right ureter. This could represent residual changes from recently passed stone or may indicate pyelonephritis. Left kidney and ureter are unremarkable. Bladder wall is not thickened. The unenhanced appearance of the liver, spleen, gallbladder, pancreas, adrenal glands, inferior vena cava, and retroperitoneal lymph nodes is unremarkable. Scattered calcification of the abdominal aorta. The stomach, small bowel, and colon are not abnormally distended. Contrast material flows through to the colon without evidence of bowel obstruction. No bowel wall thickening is appreciated. Scattered diverticula throughout the colon without evidence of inflammation. No free air or free fluid in the abdomen. Pelvis: Appendix is normal. Diverticulosis of the sigmoid colon without evidence  of diverticulitis. Prostate gland is not enlarged. No free or loculated pelvic fluid collections. No pelvic mass or lymphadenopathy. Degenerative changes in the spine and hips. No destructive bone lesions. 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 IMPRESSION: Stranding around the right kidney and ureter without hydronephrosis. This could represent residual changes from recently passed stone or could indicate pyelonephritis. Nonobstructing stone in the upper pole right kidney. Electronically Signed   By: Lucienne Capers M.D.   On: 11/04/2015 23:32        Scheduled Meds: . amLODipine  7.5 mg Oral Daily  .  cefTRIAXone (ROCEPHIN)  IV  2 g Intravenous Q24H  . enoxaparin (LOVENOX) injection  40 mg Subcutaneous Q24H  . insulin aspart  0-9 Units Subcutaneous TID WC  . loratadine  10 mg Oral Daily  . sodium chloride flush  3 mL Intravenous Q12H   Continuous Infusions: . sodium chloride 125 mL/hr at 11/05/15 1439     LOS: 1 day    Time spent: 30 minutes.    [facility], MD [facility] Pager [phone]  If 7PM-7AM, please contact night-coverage [email or web address] Password [facility] 11/06/2015, 12:45 PM

## 2015-11-06 NOTE — Progress Notes (Signed)
Paged [place], [place] x2 due to pt requesting Perocet for kidney stone pain. PA returned call and ordered Perocet po [id number]. Will continue to monitor pt.Ranelle Oyster, RN

## 2015-11-07 DIAGNOSIS — N200 Calculus of kidney: Secondary | ICD-10-CM | POA: Diagnosis not present

## 2015-11-07 DIAGNOSIS — N179 Acute kidney failure, unspecified: Secondary | ICD-10-CM | POA: Diagnosis not present

## 2015-11-07 LAB — CBC
HCT: 35.5 % — ABNORMAL LOW (ref 39.0–52.0)
Hemoglobin: 11.4 g/dL — ABNORMAL LOW (ref 13.0–17.0)
MCH: 28.3 pg (ref 26.0–34.0)
MCHC: 32.1 g/dL (ref 30.0–36.0)
MCV: 88.1 fL (ref 78.0–100.0)
Platelets: 230 10*3/uL (ref 150–400)
RBC: 4.03 MIL/uL — ABNORMAL LOW (ref 4.22–5.81)
RDW: 13.5 % (ref 11.5–15.5)
WBC: 8 10*3/uL (ref 4.0–10.5)

## 2015-11-07 LAB — BASIC METABOLIC PANEL
Anion gap: 7 (ref 5–15)
BUN: 16 mg/dL (ref 6–20)
CO2: 27 mmol/L (ref 22–32)
Calcium: 9.1 mg/dL (ref 8.9–10.3)
Chloride: 106 mmol/L (ref 101–111)
Creatinine, Ser: 1.26 mg/dL — ABNORMAL HIGH (ref 0.61–1.24)
GFR calc Af Amer: >60 mL/min (ref >60)
GFR calc non Af Amer: 58 mL/min — ABNORMAL LOW (ref >60)
Glucose, Bld: 117 mg/dL — ABNORMAL HIGH (ref 65–99)
Potassium: 4.1 mmol/L (ref 3.5–5.1)
Sodium: 140 mmol/L (ref 135–145)

## 2015-11-07 LAB — GLUCOSE, CAPILLARY: Glucose-Capillary: 135 mg/dL — ABNORMAL HIGH (ref 65–99)

## 2015-11-07 MED ORDER — ACETAMINOPHEN 325 MG PO TABS: 650.0000 mg | ORAL_TABLET | Freq: Four times a day (QID) | ORAL | Status: DC | PRN

## 2015-11-07 NOTE — Discharge Instructions (Signed)
Acute Kidney Injury °Acute kidney injury is any condition in which there is sudden (acute) damage to the kidneys. Acute kidney injury was previously known as acute kidney failure or acute renal failure. The kidneys are two organs that lie on either side of the spine between the middle of the back and the front of the abdomen. The kidneys: °· Remove wastes and extra water from the blood.   °· Produce important hormones. These help keep bones strong, regulate blood pressure, and help create red blood cells.   °· Balance the fluids and chemicals in the blood and tissues. °A small amount of kidney damage may not cause problems, but a large amount of damage may make it difficult or impossible for the kidneys to work the way they should. Acute kidney injury may develop into long-lasting (chronic) kidney disease. It may also develop into a life-threatening disease called end-stage kidney disease. Acute kidney injury can get worse very quickly, so it should be treated right away. Early treatment may prevent other kidney diseases from developing. °CAUSES  °· A problem with blood flow to the kidneys. This may be caused by:   °¨ Blood loss.   °¨ Heart disease.   °¨ Severe burns.   °¨ Liver disease. °· Direct damage to the kidneys. This may be caused by: °¨ Some medicines.   °¨ A kidney infection.   °¨ Poisoning or consuming toxic substances.   °¨ A surgical wound.   °¨ A blow to the kidney area.   °· A problem with urine flow. This may be caused by:   °¨ Cancer.   °¨ Kidney stones.   °¨ An enlarged prostate. °SIGNS AND SYMPTOMS  °· Swelling (edema) of the legs, ankles, or feet.   °· Tiredness (lethargy).   °· Nausea or vomiting.   °· Confusion.   °· Problems with urination, such as:   °¨ Painful or burning feeling during urination.   °¨ Decreased urine production.   °¨ Frequent accidents in children who are potty trained.   °¨ Bloody urine.   °· Muscle twitches and cramps.   °· Shortness of breath.   °· Seizures.   °· Chest  pain or pressure. °Sometimes, no symptoms are present.  °DIAGNOSIS °Acute kidney injury may be detected and diagnosed by tests, including blood, urine, imaging, or kidney biopsy tests.  °TREATMENT °Treatment of acute kidney injury varies depending on the cause and severity of the kidney damage. In mild cases, no treatment may be needed. The kidneys may heal on their own. If acute kidney injury is more severe, your health care provider will treat the cause of the kidney damage, help the kidneys heal, and prevent complications from occurring. Severe cases may require a procedure to remove toxic wastes from the body (dialysis) or surgery to repair kidney damage. Surgery may involve:  °· Repair of a torn kidney.   °· Removal of an obstruction. °HOME CARE INSTRUCTIONS °· Follow your prescribed diet. °· Take medicines only as directed by your health care provider.  °· Do not take any new medicines (prescription, over-the-counter, or nutritional supplements) unless approved by your health care provider. Many medicines can worsen your kidney damage or may need to have the dose adjusted.   °· Keep all follow-up visits as directed by your health care provider. This is important. °· Observe your condition to make sure you are healing as expected. °SEEK IMMEDIATE MEDICAL CARE IF: °· You are feeling ill or have severe pain in the back or side.   °· Your symptoms return or you have new symptoms. °· You have any symptoms of end-stage kidney disease. These include:   °¨ Persistent itchiness.   °¨   Loss of appetite.   Headaches.   Abnormally dark or light skin.  Numbness in the hands or feet.   Easy bruising.   Frequent hiccups.   Menstruation stops.   You have a fever.  You have increased urine production.  You have pain or bleeding when urinating. MAKE SURE YOU:   Understand these instructions.  Will watch your condition.  Will get help right away if you are not doing well or get worse.   This  information is not intended to replace advice given to you by your health care provider. Make sure you discuss any questions you have with your health care provider.   Document Released: 10/24/2010 Document Revised: 05/01/2014 Document Reviewed: 12/08/2011 Elsevier Interactive Patient Education 2016 [place].  Kidney Stones Kidney stones (urolithiasis) are solid masses that form inside your kidneys. The intense pain is caused by the stone moving through the kidney, ureter, bladder, and urethra (urinary tract). When the stone moves, the ureter starts to spasm around the stone. The stone is usually passed in your pee (urine).  HOME CARE Drink enough fluids to keep your pee clear or pale yellow. This helps to get the stone out. Take a 24-hour pee (urine) sample as told by your doctor. You may need to take another sample every 6-12 months. Strain all pee through the provided strainer. Do not pee without peeing through the strainer, not even once. If you pee the stone out, catch it in the strainer. The stone may be as small as a grain of salt. Take this to your doctor. This will help your doctor figure out what you can do to try to prevent more kidney stones. Only take medicine as told by your doctor. Make changes to your daily diet as told by your doctor. You may be told to: Limit how much salt you eat. Eat 5 or more servings of fruits and vegetables each day. Limit how much meat, poultry, fish, and eggs you eat. Keep all follow-up visits as told by your doctor. This is important. Get follow-up X-rays as told by your doctor. GET HELP IF: You have pain that gets worse even if you have been taking pain medicine. GET HELP RIGHT AWAY IF:  Your pain does not get better with medicine. You have a fever or shaking chills. Your pain increases and gets worse over 18 hours. You have new belly (abdominal) pain. You feel faint or pass out. You are unable to pee.   This information is not intended to  replace advice given to you by your health care provider. Make sure you discuss any questions you have with your health care provider.   Document Released: 09/27/2007 Document Revised: 12/30/2014 Document Reviewed: 09/11/2012 Elsevier Interactive Patient Education [employer].

## 2015-11-07 NOTE — Progress Notes (Signed)
Pt discharged to home. Discharge instructions given to pt, pt understood instructions via teachback method. All questions answered. IV discontinued before d/c, catheter tip intact.

## 2015-11-07 NOTE — Discharge Summary (Addendum)
Physician Discharge Summary  Patrick Bautista [id number] DOB: 1951-02-11  PCP: Donnie Coffin, MD  Endocriniologist: Dr. Hassan Buckler  Admit date: 11/04/2015 Discharge date: 11/07/2015  Admitted From: Home Disposition:  Home  Recommendations for Outpatient Follow-up:  1. Dr. Jacelyn Pi, Endocrinology in one week with repeat labs (CBC & BMP). Please follow final blood culture results as outpatient.  Home Health: None Equipment/Devices: None    Discharge Condition: Improved and stable  CODE STATUS: Full  Diet recommendation: Heart healthy and diabetic diet  Discharge Diagnoses:  Principal Problem:   AKI (acute kidney injury) ([place]) Active Problems:   Acute pyelonephritis   Acute diverticulitis   Kidney stones   Brief/Interim Summary: 66 year old male with PMH of DM 2, HTN, hemorrhoids, diverticulitis, presented to [facility] ED on 11/04/15 with initial diffuse abdominal pain, diarrhea, intermittent mild rectal bleeding due to hemorrhoids, subsequently developed right flank pain and low-grade fevers. CT abdomen showed stranding around right kidney and ureter. Admitted for acute kidney injury and presumed acute pyelonephritis.  Assessment & Plan:  Principal Problem:  AKI (acute kidney injury) ([place]) Active Problems:  Acute pyelonephritis  Acute diverticulitis   Acute kidney injury - Unclear etiology.? Prerenal due to intravascular volume depletion + ARB/HCTZ.. -Baseline creatinine not known. - Presented with creatinine of 1.9. Hydrated with IV fluids and creatinine has improved to 1.26. Patient may have an element of chronic kidney disease. - CT abdomen without hydronephrosis.  Presumed passed kidney stone - Patient denies urinary symptoms. CT abdomen and pelvis showed stranding around the right kidney and ureter without hydronephrosis which could represent residual changes from recently passed stone or could indicate pyelonephritis. Nonobstructing stone in the upper pole of the  right kidney. - Urine microscopy and UA unimpressive without bacteriuria or pyuria. Blood cultures 2: Pending. Unfortunately urine cultures were never sent. - DC'ed empirically started IV Rocephin.  - His presentation may have been mostly from kidney stone in the absence of urinary symptoms, bland urine microscopy & no significant leukocytosis or fever. - Counseled regarding adequate hydration and may consider outpatient urology consultation for recurrent symptoms.  Type II DM - Reasonable inpatient control on SSI. Home metformin held and will be resumed at discharge. Hemoglobin A1c 8.4 suggesting poor outpatient control. May need addition of second agent. Outpatient follow-up with endocrinology. TSH normal.  Essential hypertension - Controlled. Continue amlodipine. Hyzaar was temporarily held in the hospital due to acute kidney injury which has resolved and will be resumed at discharge.  Viral URI - Improved and symptoms have resolved.  Anemia - Stable. Unclear etiology. Baseline hemoglobin not known. Outpatient follow-up and evaluation as team necessary.   Consultants:   None  Procedures:   None   Discharge Instructions      Discharge Instructions    Call MD for:  persistant nausea and vomiting    Complete by:  As directed      Call MD for:  severe uncontrolled pain    Complete by:  As directed      Call MD for:  temperature >100.4    Complete by:  As directed      Diet - low sodium heart healthy    Complete by:  As directed      Diet Carb Modified    Complete by:  As directed      Increase activity slowly    Complete by:  As directed             Medication List    TAKE these  medications        acetaminophen 325 MG tablet  Commonly known as:  TYLENOL  Take 2 tablets (650 mg total) by mouth every 6 (six) hours as needed for mild pain, moderate pain, fever or headache (or Fever >/= 101).     amLODipine 2.5 MG tablet  Commonly known as:  NORVASC  Take 2.5  mg by mouth See admin instructions. In conjunction with one 5 mg tablet to equal a total dosage of 7.5 milligrams in the morning     amLODipine 5 MG tablet  Commonly known as:  NORVASC  Take 5 mg by mouth See admin instructions. In conjunction with one 2.5 mg tablet to equal a total dosage of 7.5 milligrams in the morning     losartan-hydrochlorothiazide 100-25 MG tablet  Commonly known as:  HYZAAR  Take 1 tablet by mouth daily.     metFORMIN 500 MG 24 hr tablet  Commonly known as:  GLUCOPHAGE-XR  Take 1,000 mg by mouth 2 (two) times daily.       Follow-up Information    Follow up with [facility].   Why:  To find a primary care doctor accepting new patients   Contact information:   [phone]      Follow up with Jacelyn Pi, MD. Schedule an appointment as soon as possible for a visit in 1 week.   Specialty:  Endocrinology   Why:  To be seen with repeat labs (CBC & BMP).   Contact information:   [place] [id number] [phone]      Allergies  Allergen Reactions  . Codeine Itching  . Penicillins Itching    Has patient had a PCN reaction causing immediate rash, facial/tongue/throat swelling, SOB or lightheadedness with hypotension: No Has patient had a PCN reaction causing severe rash involving mucus membranes or skin necrosis: No Has patient had a PCN reaction that required hospitalization No Has patient had a PCN reaction occurring within the last 10 years: No If all of the above answers are "NO", then may proceed with Cephalosporin use.   . Tape Itching and Rash    Tape causes blisters; please use Coban wrap if possible      Procedures/Studies: Ct Abdomen Pelvis Wo Contrast  11/04/2015  CLINICAL DATA:  Pain in the lower abdomen, low back, and right flank for 1 day. Hemorrhoid pain this week. No trauma or surgery. No IV contrast material due to elevated creatinine and decreased GFR. EXAM: CT ABDOMEN AND PELVIS WITHOUT  CONTRAST TECHNIQUE: Multidetector CT imaging of the abdomen and pelvis was performed following the standard protocol without IV contrast. COMPARISON:  03/08/2006 FINDINGS: The lung bases are clear. Coronary artery calcifications. Right kidney demonstrates a nonobstructing stone in the upper pole. No hydronephrosis or hydroureter. There is stranding around the right kidney and right ureter. This could represent residual changes from recently passed stone or may indicate pyelonephritis. Left kidney and ureter are unremarkable. Bladder wall is not thickened. The unenhanced appearance of the liver, spleen, gallbladder, pancreas, adrenal glands, inferior vena cava, and retroperitoneal lymph nodes is unremarkable. Scattered calcification of the abdominal aorta. The stomach, small bowel, and colon are not abnormally distended. Contrast material flows through to the colon without evidence of bowel obstruction. No bowel wall thickening is appreciated. Scattered diverticula throughout the colon without evidence of inflammation. No free air or free fluid in the abdomen. Pelvis: Appendix is normal. Diverticulosis of the sigmoid colon without evidence of diverticulitis. Prostate gland is not  enlarged. No free or loculated pelvic fluid collections. No pelvic mass or lymphadenopathy. Degenerative changes in the spine and hips. No destructive bone lesions. 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 IMPRESSION: Stranding around the right kidney and ureter without hydronephrosis. This could represent residual changes from recently passed stone or could indicate pyelonephritis. Nonobstructing stone in the upper pole right kidney. Electronically Signed   By: Lucienne Capers M.D.   On: 11/04/2015 23:32      Subjective: Overnight events noted. Patient stated that he had minimal, 2-3/10 lower back pain last night and asked for pain medications more for sleep rather than pain. Slept well overnight. No further back pain or  any other complaints. Anxious to go home.  Discharge Exam:  Filed Vitals:   11/06/15 0459 11/06/15 1258 11/06/15 2142 11/07/15 0500  BP: 118/68 114/64 142/74 133/85  Pulse: 73 72 80 72  Temp: 98.9 F (37.2 C) 98.4 F (36.9 C) 98.3 F (36.8 C) 98.4 F (36.9 C)  TempSrc: Oral  Oral Oral  Resp: 19 19 18 18   Height:      Weight:      SpO2: 98% 95% 98% 98%    General exam: Pleasant middle-aged male lying comfortably supine in bed. Does not appear septic or toxic.  Respiratory system: Clear to auscultation. Respiratory effort normal. Cardiovascular system: S1 & S2 heard, RRR. No JVD, murmurs, rubs, gallops or clicks. No pedal edema. Gastrointestinal system: Abdomen is nondistended, soft and nontender. No organomegaly or masses felt. Normal bowel sounds heard.No renal angle tenderness.  Central nervous system: Alert and oriented. No focal neurological deficits. Extremities: Symmetric 5 x 5 power. Skin: No rashes, lesions or ulcers Psychiatry: Judgement and insight appear normal. Mood & affect appropriate.     The results of significant diagnostics from this hospitalization (including imaging, microbiology, ancillary and laboratory) are listed below for reference.     Microbiology: Recent Results (from the past 240 hour(s))  Culture, blood (routine x 2)     Status: None (Preliminary result)   Collection Time: 11/05/15  7:57 AM  Result Value Ref Range Status   Specimen Description BLOOD RIGHT HAND  Final   Special Requests BOTTLES DRAWN AEROBIC AND ANAEROBIC 5ML   Final   Culture NO GROWTH 1 DAY  Final   Report Status PENDING  Incomplete  Culture, blood (routine x 2)     Status: None (Preliminary result)   Collection Time: 11/05/15  8:02 AM  Result Value Ref Range Status   Specimen Description BLOOD RIGHT ANTECUBITAL  Final   Special Requests BOTTLES DRAWN AEROBIC ONLY 5ML  Final   Culture NO GROWTH 1 DAY  Final   Report Status PENDING  Incomplete     Labs: BNP (last 3  results) No results for input(s): BNP in the last 8760 hours. Basic Metabolic Panel:  Recent Labs Lab 11/04/15 2040 11/05/15 0127 11/05/15 0753 11/06/15 0555 11/07/15 0417  NA 135 135 132* 140 140  K 4.2 4.3 4.2 4.0 4.1  CL 102 104 98* 104 106  CO2 25 23 24 29 27   GLUCOSE 137* 136* 156* 134* 117*  BUN 38* 33* 28* 17 16  CREATININE 1.93* 1.84* 1.92* 1.48* 1.26*  CALCIUM 10.2 9.3 9.3 9.0 9.1  MG  --   --  1.9  --   --    Liver Function Tests:  Recent Labs Lab 11/04/15 2040 11/05/15 0753 11/06/15 0555  AST 29 29 20   ALT 35 31 23  ALKPHOS 66 67 52  BILITOT 0.5 0.5 0.5  PROT 7.4 6.7 6.0*  ALBUMIN 4.3 3.6 3.0*    Recent Labs Lab 11/04/15 2040  LIPASE 12   No results for input(s): AMMONIA in the last 168 hours. CBC:  Recent Labs Lab 11/04/15 2040 11/06/15 0555 11/07/15 0417  WBC 11.6* 7.5 8.0  NEUTROABS 7.5  --   --   HGB 12.8* 11.0* 11.4*  HCT 37.7* 34.0* 35.5*  MCV 85.5 88.1 88.1  PLT 246 209 230   Cardiac Enzymes: No results for input(s): CKTOTAL, CKMB, CKMBINDEX, TROPONINI in the last 168 hours. BNP: Invalid input(s): POCBNP CBG:  Recent Labs Lab 11/06/15 0735 11/06/15 1137 11/06/15 1634 11/06/15 2142 11/07/15 0752  GLUCAP 146* 109* 155* 149* 135*   D-Dimer No results for input(s): DDIMER in the last 72 hours. Hgb A1c  Recent Labs  11/05/15 0753  HGBA1C 8.4*   Lipid Profile No results for input(s): CHOL, HDL, LDLCALC, TRIG, CHOLHDL, LDLDIRECT in the last 72 hours. Thyroid function studies  Recent Labs  11/05/15 0753  TSH 3.858   Anemia work up No results for input(s): VITAMINB12, FOLATE, FERRITIN, TIBC, IRON, RETICCTPCT in the last 72 hours. Urinalysis    Component Value Date/Time   COLORURINE YELLOW 11/04/2015 2049   APPEARANCEUR CLEAR 11/04/2015 2049   LABSPEC 1.020 11/04/2015 2049   PHURINE 5.0 11/04/2015 2049   GLUCOSEU NEGATIVE 11/04/2015 2049   HGBUR NEGATIVE 11/04/2015 2049   BILIRUBINUR NEGATIVE 11/04/2015 2049    KETONESUR 15* 11/04/2015 2049   PROTEINUR NEGATIVE 11/04/2015 2049   NITRITE NEGATIVE 11/04/2015 2049   LEUKOCYTESUR NEGATIVE 11/04/2015 2049   Sepsis Labs Invalid input(s): PROCALCITONIN,  WBC,  LACTICIDVEN    Time coordinating discharge: Over 30 minutes  SIGNED:  Vernell Leep, MD, FACP, FHM. [facility] Pager [phone]  If 7PM-7AM, please contact night-coverage [email or web address] Password [email or web address] 11/07/2015, 3:11 PM

## 2015-11-10 LAB — CULTURE, BLOOD (ROUTINE X 2)
Culture: NO GROWTH
Culture: NO GROWTH

## 2016-04-25 ENCOUNTER — Other Ambulatory Visit: Payer: Self-pay | Admitting: Orthopedic Surgery

## 2016-04-25 DIAGNOSIS — M5441 Lumbago with sciatica, right side: Secondary | ICD-10-CM

## 2016-05-04 ENCOUNTER — Other Ambulatory Visit: Payer: Self-pay | Admitting: Orthopedic Surgery

## 2016-05-04 ENCOUNTER — Ambulatory Visit
Admission: RE | Admit: 2016-05-04 | Discharge: 2016-05-04 | Disposition: A | Discharge status: Home / Self Care | Payer: Self-pay | Source: Ambulatory Visit | Attending: Orthopedic Surgery | Admitting: Orthopedic Surgery

## 2016-05-04 DIAGNOSIS — R52 Pain, unspecified: Secondary | ICD-10-CM

## 2016-05-05 ENCOUNTER — Ambulatory Visit
Admission: RE | Admit: 2016-05-05 | Discharge: 2016-05-05 | Disposition: A | Discharge status: Home / Self Care | Payer: Medicare Other | Source: Ambulatory Visit | Attending: Orthopedic Surgery | Admitting: Orthopedic Surgery

## 2016-05-05 DIAGNOSIS — M5441 Lumbago with sciatica, right side: Secondary | ICD-10-CM

## 2016-05-05 MED ORDER — IOPAMIDOL (ISOVUE-M 200) INJECTION 41%
15.0000 mL | Freq: Once | INTRAMUSCULAR | Status: AC
  Administered 2016-05-05: 15 mL via INTRATHECAL

## 2016-05-05 MED ORDER — DIAZEPAM 5 MG PO TABS
5.0000 mg | ORAL_TABLET | Freq: Once | ORAL | Status: AC
  Administered 2016-05-05: 5 mg via ORAL

## 2016-05-05 NOTE — Discharge Instructions (Signed)

## 2016-05-08 ENCOUNTER — Telehealth: Payer: Self-pay

## 2016-05-08 NOTE — Telephone Encounter (Signed)
Spoke with patient after he had a myelogram here 05/05/16.  He states he was really sore in his back and legs during the bedrest is better now.  Denies headache.  jkl

## 2016-08-12 ENCOUNTER — Emergency Department (HOSPITAL_BASED_OUTPATIENT_CLINIC_OR_DEPARTMENT_OTHER): Payer: Medicare Other

## 2016-08-12 ENCOUNTER — Emergency Department (HOSPITAL_BASED_OUTPATIENT_CLINIC_OR_DEPARTMENT_OTHER)
Admission: EM | Admit: 2016-08-12 | Discharge: 2016-08-12 | Disposition: A | Discharge status: Home / Self Care | Payer: Medicare Other | Attending: Emergency Medicine | Admitting: Emergency Medicine

## 2016-08-12 ENCOUNTER — Encounter (HOSPITAL_BASED_OUTPATIENT_CLINIC_OR_DEPARTMENT_OTHER): Payer: Self-pay | Admitting: Emergency Medicine

## 2016-08-12 DIAGNOSIS — R51 Headache: Secondary | ICD-10-CM | POA: Insufficient documentation

## 2016-08-12 DIAGNOSIS — Z7984 Long term (current) use of oral hypoglycemic drugs: Secondary | ICD-10-CM | POA: Diagnosis not present

## 2016-08-12 DIAGNOSIS — M542 Cervicalgia: Secondary | ICD-10-CM | POA: Insufficient documentation

## 2016-08-12 DIAGNOSIS — Y9389 Activity, other specified: Secondary | ICD-10-CM | POA: Diagnosis not present

## 2016-08-12 DIAGNOSIS — M791 Myalgia: Secondary | ICD-10-CM | POA: Diagnosis not present

## 2016-08-12 DIAGNOSIS — Z79899 Other long term (current) drug therapy: Secondary | ICD-10-CM | POA: Insufficient documentation

## 2016-08-12 DIAGNOSIS — I10 Essential (primary) hypertension: Secondary | ICD-10-CM | POA: Insufficient documentation

## 2016-08-12 DIAGNOSIS — E119 Type 2 diabetes mellitus without complications: Secondary | ICD-10-CM | POA: Diagnosis not present

## 2016-08-12 DIAGNOSIS — Y92410 Unspecified street and highway as the place of occurrence of the external cause: Secondary | ICD-10-CM | POA: Diagnosis not present

## 2016-08-12 DIAGNOSIS — Y999 Unspecified external cause status: Secondary | ICD-10-CM | POA: Diagnosis not present

## 2016-08-12 DIAGNOSIS — M545 Low back pain: Secondary | ICD-10-CM | POA: Insufficient documentation

## 2016-08-12 DIAGNOSIS — S0990XA Unspecified injury of head, initial encounter: Secondary | ICD-10-CM | POA: Diagnosis present

## 2016-08-12 MED ORDER — TRAMADOL HCL 50 MG PO TABS: 50.0000 mg | ORAL_TABLET | Freq: Four times a day (QID) | ORAL | 0 refills | Status: DC | PRN

## 2016-08-12 MED ORDER — ONDANSETRON 4 MG PO TBDP
4.0000 mg | ORAL_TABLET | Freq: Once | ORAL | Status: AC
  Administered 2016-08-12: 4 mg via ORAL
  Filled 2016-08-12: qty 1

## 2016-08-12 MED ORDER — TRAMADOL HCL 50 MG PO TABS: 50.0000 mg | ORAL_TABLET | Freq: Once | ORAL | Status: DC

## 2016-08-12 MED ORDER — CYCLOBENZAPRINE HCL 5 MG PO TABS: 5.0000 mg | ORAL_TABLET | Freq: Two times a day (BID) | ORAL | 0 refills | Status: DC | PRN

## 2016-08-12 MED ORDER — IBUPROFEN 400 MG PO TABS
600.0000 mg | ORAL_TABLET | Freq: Once | ORAL | Status: AC
  Administered 2016-08-12: 600 mg via ORAL
  Filled 2016-08-12: qty 1
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
## 2017-02-14 ENCOUNTER — Other Ambulatory Visit: Payer: Self-pay | Admitting: Urology

## 2017-02-14 NOTE — Patient Instructions (Addendum)
Patrick Bautista  02/14/2017   Your procedure is scheduled on: Friday 02-16-17  Report to [facility] Main  Entrance Take [place]  elevators to 3rd floor to  [place] at 1045 AM.   Call this number if you have problems the morning of surgery [phone]    Remember: ONLY 1 PERSON MAY GO WITH YOU TO SHORT STAY TO GET  READY MORNING OF [place].  Do not eat food or drink liquids :After Midnight.     Take these medicines the morning of surgery with A SIP OF WATER: AMLODIPINE, ES TYLENOL IF NEEDED DO NOT TAKE ANY DIABETIC MEDICATIONS DAY OF YOUR SURGERY                               You may not have any metal on your body including hair pins and              piercings  Do not wear jewelry, make-up, lotions, powders or perfumes, deodorant             Do not wear nail polish.  Do not shave  48 hours prior to surgery.              Men may shave face and neck.   Do not bring valuables to the hospital. [place].  Contacts, dentures or bridgework may not be worn into surgery.  Leave suitcase in the car. After surgery it may be brought to your room.     Patients discharged the day of surgery will not be allowed to drive home.  Name and phone number of your driver: Durene Romans [phone]  Special Instructions: N/A              Please read over the following fact sheets you were given: _____________________________________________________________________             How to Manage Your Diabetes Before and After Surgery  Why is it important to control my blood sugar before and after surgery? . Improving blood sugar levels before and after surgery helps healing and can limit problems. . A way of improving blood sugar control is eating a healthy diet by: o  Eating less sugar and carbohydrates o  Increasing activity/exercise o  Talking with your doctor about reaching your blood sugar goals . High  blood sugars (greater than 180 mg/dL) can raise your risk of infections and slow your recovery, so you will need to focus on controlling your diabetes during the [place] before surgery. . Make sure that the doctor who takes care of your diabetes knows about your planned surgery including the date and location.  How do I manage my blood sugar before surgery? . Check your blood sugar at least 4 times a day, starting 2 days before surgery, to make sure that the level is not too high or low. o Check your blood sugar the morning of your surgery when you wake up and every 2 hours until you get to the Short Stay unit. . If your blood sugar is less than 70 mg/dL, you will need to treat for low blood sugar: o Do not take insulin. o Treat a low blood sugar (less than 70 mg/dL) with  cup of  clear juice (cranberry or apple), 4 glucose tablets, OR glucose gel. o Recheck blood sugar in 15 minutes after treatment (to make sure it is greater than 70 mg/dL). If your blood sugar is not greater than 70 mg/dL on recheck, call [phone] for further instructions. . Report your blood sugar to the short stay nurse when you get to Short Stay.  . If you are admitted to the hospital after surgery: o Your blood sugar will be checked by the staff and you will probably be given insulin after surgery (instead of oral diabetes medicines) to make sure you have good blood sugar levels. o The goal for blood sugar control after surgery is 80-180 mg/dL.   WHAT DO I DO ABOUT MY DIABETES MEDICATION?  [place] Do not take oral diabetes medicines (pills) the morning of surgery.  . THE DAY BEFORE SURGERY 02-15-17 TAKE YOUR MORNING DOSE OF GLIMPERIDE. TAKE YOUR METFORMIN AS USUAL.      THE MORNING OF SURGERY 02-16-17 TAKE NO DIABETIC MEDS.   Patient Signature:  Date:   Nurse Signature:  Date:   Reviewed and Endorsed by [facility] Patient Education Committee, August 2015[facility] - Preparing for Surgery Before surgery, you can  play an important role.  Because skin is not sterile, your skin needs to be as free of germs as possible.  You can reduce the number of germs on your skin by washing with CHG (chlorahexidine gluconate) soap before surgery.  CHG is an antiseptic cleaner which kills germs and bonds with the skin to continue killing germs even after washing. Please DO NOT use if you have an allergy to CHG or antibacterial soaps.  If your skin becomes reddened/irritated stop using the CHG and inform your nurse when you arrive at Short Stay. Do not shave (including legs and underarms) for at least 48 hours prior to the first CHG shower.  You may shave your face/neck. Please follow these instructions carefully:  1.  Shower with CHG Soap the night before surgery and the  morning of Surgery.  2.  If you choose to wash your hair, wash your hair first as usual with your  normal  shampoo.  3.  After you shampoo, rinse your hair and body thoroughly to remove the  shampoo.                           4.  Use CHG as you would any other liquid soap.  You can apply chg directly  to the skin and wash                       Gently with a scrungie or clean washcloth.  5.  Apply the CHG Soap to your body ONLY FROM THE NECK DOWN.   Do not use on face/ open                           Wound or open sores. Avoid contact with eyes, ears mouth and genitals (private parts).                       Wash face,  Genitals (private parts) with your normal soap.             6.  Wash thoroughly, paying special attention to the area where your surgery  will be performed.  7.  Thoroughly rinse your body with warm  water from the neck down.  8.  DO NOT shower/wash with your normal soap after using and rinsing off  the CHG Soap.                9.  Pat yourself dry with a clean towel.            10.  Wear clean pajamas.            11.  Place clean sheets on your bed the night of your first shower and do not  sleep with pets. Day of Surgery : Do not apply any  lotions/deodorants the morning of surgery.  Please wear clean clothes to the hospital/surgery center.

## 2017-02-15 ENCOUNTER — Encounter (HOSPITAL_COMMUNITY)
Admission: RE | Admit: 2017-02-15 | Discharge: 2017-02-15 | Disposition: A | Discharge status: Home / Self Care | Payer: Medicare Other | Source: Ambulatory Visit | Attending: Urology | Admitting: Urology

## 2017-02-15 ENCOUNTER — Encounter (HOSPITAL_COMMUNITY): Payer: Self-pay

## 2017-02-15 DIAGNOSIS — E109 Type 1 diabetes mellitus without complications: Secondary | ICD-10-CM | POA: Diagnosis not present

## 2017-02-15 DIAGNOSIS — I10 Essential (primary) hypertension: Secondary | ICD-10-CM | POA: Diagnosis not present

## 2017-02-15 DIAGNOSIS — Z79899 Other long term (current) drug therapy: Secondary | ICD-10-CM | POA: Diagnosis not present

## 2017-02-15 DIAGNOSIS — N200 Calculus of kidney: Secondary | ICD-10-CM | POA: Diagnosis present

## 2017-02-15 DIAGNOSIS — N132 Hydronephrosis with renal and ureteral calculous obstruction: Secondary | ICD-10-CM | POA: Diagnosis not present

## 2017-02-15 DIAGNOSIS — Z7984 Long term (current) use of oral hypoglycemic drugs: Secondary | ICD-10-CM | POA: Diagnosis not present

## 2017-02-15 HISTORY — DX: Diverticulitis of intestine, part unspecified, without perforation or abscess without bleeding: K57.92

## 2017-02-15 HISTORY — DX: Other intervertebral disc displacement, lumbar region: M51.26

## 2017-02-15 HISTORY — DX: Unspecified osteoarthritis, unspecified site: M19.90

## 2017-02-15 HISTORY — DX: Personal history of urinary calculi: Z87.442

## 2017-02-15 LAB — BASIC METABOLIC PANEL
Anion gap: 12 (ref 5–15)
BUN: 31 mg/dL — ABNORMAL HIGH (ref 6–20)
CO2: 25 mmol/L (ref 22–32)
Calcium: 9.9 mg/dL (ref 8.9–10.3)
Chloride: 100 mmol/L — ABNORMAL LOW (ref 101–111)
Creatinine, Ser: 2.14 mg/dL — ABNORMAL HIGH (ref 0.61–1.24)
GFR calc Af Amer: 35 mL/min — ABNORMAL LOW (ref >60)
GFR calc non Af Amer: 30 mL/min — ABNORMAL LOW (ref >60)
Glucose, Bld: 181 mg/dL — ABNORMAL HIGH (ref 65–99)
Potassium: 4.4 mmol/L (ref 3.5–5.1)
Sodium: 137 mmol/L (ref 135–145)

## 2017-02-15 LAB — CBC
HCT: 36.7 % — ABNORMAL LOW (ref 39.0–52.0)
Hemoglobin: 12.2 g/dL — ABNORMAL LOW (ref 13.0–17.0)
MCH: 28.8 pg (ref 26.0–34.0)
MCHC: 33.2 g/dL (ref 30.0–36.0)
MCV: 86.8 fL (ref 78.0–100.0)
Platelets: 426 10*3/uL — ABNORMAL HIGH (ref 150–400)
RBC: 4.23 MIL/uL (ref 4.22–5.81)
RDW: 13.7 % (ref 11.5–15.5)
WBC: 12.1 10*3/uL — ABNORMAL HIGH (ref 4.0–10.5)

## 2017-02-15 LAB — HEMOGLOBIN A1C
Hgb A1c MFr Bld: 9 % — ABNORMAL HIGH (ref 4.8–5.6)
Mean Plasma Glucose: 211.6 mg/dL

## 2017-02-15 LAB — GLUCOSE, CAPILLARY: Glucose-Capillary: 162 mg/dL — ABNORMAL HIGH (ref 65–99)

## 2017-02-15 NOTE — Progress Notes (Signed)
bmet results faxed to [facility] by epic

## 2017-02-15 NOTE — Progress Notes (Signed)
LOV DR Chalmers Cater  11-17-16 ON CHART

## 2017-02-15 NOTE — Progress Notes (Signed)
hemaglobin a 1 c results faxed to dr Louis Meckel by epic

## 2017-02-16 ENCOUNTER — Ambulatory Visit (HOSPITAL_COMMUNITY): Payer: Medicare Other

## 2017-02-16 ENCOUNTER — Ambulatory Visit (HOSPITAL_COMMUNITY)
Admission: RE | Admit: 2017-02-16 | Discharge: 2017-02-16 | Disposition: A | Discharge status: Home / Self Care | Payer: Medicare Other | Source: Ambulatory Visit | Attending: Urology | Admitting: Urology

## 2017-02-16 ENCOUNTER — Encounter (HOSPITAL_COMMUNITY)
Admission: RE | Disposition: A | Discharge status: Home / Self Care | Payer: Self-pay | Source: Ambulatory Visit | Attending: Urology

## 2017-02-16 ENCOUNTER — Ambulatory Visit (HOSPITAL_COMMUNITY): Payer: Medicare Other | Admitting: Certified Registered Nurse Anesthetist

## 2017-02-16 ENCOUNTER — Encounter (HOSPITAL_COMMUNITY): Payer: Self-pay | Admitting: *Deleted

## 2017-02-16 DIAGNOSIS — Z7984 Long term (current) use of oral hypoglycemic drugs: Secondary | ICD-10-CM | POA: Diagnosis not present

## 2017-02-16 DIAGNOSIS — E109 Type 1 diabetes mellitus without complications: Secondary | ICD-10-CM | POA: Insufficient documentation

## 2017-02-16 DIAGNOSIS — I10 Essential (primary) hypertension: Secondary | ICD-10-CM | POA: Insufficient documentation

## 2017-02-16 DIAGNOSIS — N132 Hydronephrosis with renal and ureteral calculous obstruction: Secondary | ICD-10-CM | POA: Insufficient documentation

## 2017-02-16 DIAGNOSIS — Z79899 Other long term (current) drug therapy: Secondary | ICD-10-CM | POA: Diagnosis not present

## 2017-02-16 HISTORY — PX: CYSTOSCOPY/URETEROSCOPY/HOLMIUM LASER/STENT PLACEMENT: SHX6546

## 2017-02-16 LAB — GLUCOSE, CAPILLARY: Glucose-Capillary: 175 mg/dL — ABNORMAL HIGH (ref 65–99)

## 2017-02-16 SURGERY — CYSTOSCOPY/URETEROSCOPY/HOLMIUM LASER/STENT PLACEMENT
Anesthesia: General | Site: Ureter | Laterality: Right

## 2017-02-16 MED ORDER — ACETAMINOPHEN 10 MG/ML IV SOLN
INTRAVENOUS | Status: AC
  Administered 2017-02-16: 1000 mg via INTRAVENOUS
  Filled 2017-02-16: qty 100

## 2017-02-16 MED ORDER — LIDOCAINE 2% (20 MG/ML) 5 ML SYRINGE
INTRAMUSCULAR | Status: AC
  Filled 2017-02-16: qty 5

## 2017-02-16 MED ORDER — ACETAMINOPHEN 10 MG/ML IV SOLN
1000.0000 mg | Freq: Once | INTRAVENOUS | Status: AC
  Administered 2017-02-16: 1000 mg via INTRAVENOUS

## 2017-02-16 MED ORDER — FENTANYL CITRATE (PF) 100 MCG/2ML IJ SOLN
INTRAMUSCULAR | Status: DC | PRN
  Administered 2017-02-16 (×4): 50 ug via INTRAVENOUS

## 2017-02-16 MED ORDER — ONDANSETRON HCL 4 MG/2ML IJ SOLN
INTRAMUSCULAR | Status: AC
  Filled 2017-02-16: qty 2

## 2017-02-16 MED ORDER — SODIUM CHLORIDE 0.9 % IR SOLN
Status: DC | PRN
  Administered 2017-02-16: 3000 mL via INTRAVESICAL

## 2017-02-16 MED ORDER — LACTATED RINGERS IV SOLN
INTRAVENOUS | Status: DC
  Administered 2017-02-16: 11:00:00 via INTRAVENOUS

## 2017-02-16 MED ORDER — CIPROFLOXACIN IN D5W 400 MG/200ML IV SOLN
400.0000 mg | Freq: Once | INTRAVENOUS | Status: AC
  Administered 2017-02-16: 400 mg via INTRAVENOUS
  Filled 2017-02-16: qty 200

## 2017-02-16 MED ORDER — PROPOFOL 10 MG/ML IV BOLUS
INTRAVENOUS | Status: AC
  Filled 2017-02-16: qty 20

## 2017-02-16 MED ORDER — PROPOFOL 10 MG/ML IV BOLUS
INTRAVENOUS | Status: DC | PRN
Start: 2017-02-16 — End: 2017-02-16
  Administered 2017-02-16: 200 mg via INTRAVENOUS

## 2017-02-16 MED ORDER — FENTANYL CITRATE (PF) 100 MCG/2ML IJ SOLN
INTRAMUSCULAR | Status: AC
  Filled 2017-02-16: qty 2

## 2017-02-16 MED ORDER — CIPROFLOXACIN HCL 500 MG PO TABS: 500.0000 mg | ORAL_TABLET | Freq: Once | ORAL | 0 refills | Status: AC

## 2017-02-16 MED ORDER — PHENAZOPYRIDINE HCL 200 MG PO TABS: 200.0000 mg | ORAL_TABLET | Freq: Three times a day (TID) | ORAL | 0 refills | Status: DC | PRN

## 2017-02-16 MED ORDER — ONDANSETRON HCL 4 MG/2ML IJ SOLN: 4.0000 mg | Freq: Once | INTRAMUSCULAR | Status: DC | PRN

## 2017-02-16 MED ORDER — TRAMADOL HCL 50 MG PO TABS: 50.0000 mg | ORAL_TABLET | Freq: Four times a day (QID) | ORAL | 0 refills | Status: DC | PRN

## 2017-02-16 MED ORDER — FENTANYL CITRATE (PF) 100 MCG/2ML IJ SOLN: 25.0000 ug | INTRAMUSCULAR | Status: DC | PRN

## 2017-02-16 MED ORDER — LIDOCAINE 2% (20 MG/ML) 5 ML SYRINGE
INTRAMUSCULAR | Status: DC | PRN
  Administered 2017-02-16: 100 mg via INTRAVENOUS

## 2017-02-16 MED ORDER — EPHEDRINE SULFATE 50 MG/ML IJ SOLN
INTRAMUSCULAR | Status: DC | PRN
  Administered 2017-02-16: 10 mg via INTRAVENOUS
  Administered 2017-02-16 (×2): 5 mg via INTRAVENOUS

## 2017-02-16 MED ORDER — DEXAMETHASONE SODIUM PHOSPHATE 10 MG/ML IJ SOLN
INTRAMUSCULAR | Status: DC | PRN
  Administered 2017-02-16: 10 mg via INTRAVENOUS

## 2017-02-16 MED ORDER — MIDAZOLAM HCL 2 MG/2ML IJ SOLN
INTRAMUSCULAR | Status: AC
  Filled 2017-02-16: qty 2

## 2017-02-16 MED ORDER — ONDANSETRON HCL 4 MG/2ML IJ SOLN
INTRAMUSCULAR | Status: DC | PRN
  Administered 2017-02-16: 4 mg via INTRAVENOUS

## 2017-02-16 MED ORDER — SODIUM CHLORIDE 0.9 % IV SOLN
INTRAVENOUS | Status: DC | PRN
  Administered 2017-02-16: 3 mL

## 2017-02-16 MED ORDER — EPHEDRINE 5 MG/ML INJ
INTRAVENOUS | Status: AC
  Filled 2017-02-16: qty 10

## 2017-02-16 SURGICAL SUPPLY — 28 items
BAG URO CATCHER STRL LF (MISCELLANEOUS) ×3 IMPLANT
BASKET DAKOTA 1.9FR 11X120 (BASKET) IMPLANT
BASKET ZERO TIP NITINOL 2.4FR (BASKET) IMPLANT
BSKT STON RTRVL ZERO TP 2.4FR (BASKET)
CATH URET 5FR 28IN OPEN ENDED (CATHETERS) ×3 IMPLANT
CATH URET DUAL LUMEN 6-10FR 50 (CATHETERS) ×2 IMPLANT
CLOTH BEACON ORANGE TIMEOUT ST (SAFETY) ×3 IMPLANT
COVER FOOTSWITCH UNIV (MISCELLANEOUS) IMPLANT
COVER SURGICAL LIGHT HANDLE (MISCELLANEOUS) ×3 IMPLANT
EXTRACTOR STONE NITINOL NGAGE (UROLOGICAL SUPPLIES) ×2 IMPLANT
FIBER LASER FLEXIVA 365 (UROLOGICAL SUPPLIES) ×2 IMPLANT
FIBER LASER TRAC TIP (UROLOGICAL SUPPLIES) ×2 IMPLANT
GLOVE BIOGEL M STRL SZ7.5 (GLOVE) ×3 IMPLANT
GOWN STRL REUS W/TWL XL LVL3 (GOWN DISPOSABLE) ×3 IMPLANT
GUIDEWIRE ANG ZIPWIRE 038X150 (WIRE) IMPLANT
GUIDEWIRE STR DUAL SENSOR (WIRE) ×5 IMPLANT
KIT BALLN UROMAX 15FX4 (MISCELLANEOUS) IMPLANT
KIT BALLN UROMAX 26 75X4 (MISCELLANEOUS) ×2
MANIFOLD NEPTUNE II (INSTRUMENTS) ×3 IMPLANT
PACK CYSTO (CUSTOM PROCEDURE TRAY) ×3 IMPLANT
SHEATH ACCESS URETERAL 24CM (SHEATH) IMPLANT
SHEATH ACCESS URETERAL 38CM (SHEATH) IMPLANT
SHEATH ACCESS URETERAL 54CM (SHEATH) IMPLANT
SHEATH URETERAL 12FRX35CM (MISCELLANEOUS) ×2 IMPLANT
STENT CONTOUR 6FRX28X.038 (STENTS) ×2 IMPLANT
TUBING CONNECTING 10 (TUBING) ×2 IMPLANT
TUBING CONNECTING 10' (TUBING) ×1
WIRE COONS/BENSON .038X145CM (WIRE) ×4 IMPLANT

## 2017-02-16 NOTE — Anesthesia Postprocedure Evaluation (Signed)
Anesthesia Post Note  Patient: Patrick Bautista  Procedure(s) Performed: CYSTOSCOPY/RETROGRADE/URETEROSCOPY/HOLMIUM LASER/STENT PLACEMENT (Right Ureter)     Patient location during evaluation: PACU Anesthesia Type: General Level of consciousness: awake and alert Pain management: pain level controlled Vital Signs Assessment: post-procedure vital signs reviewed and stable Respiratory status: spontaneous breathing, nonlabored ventilation and respiratory function stable Cardiovascular status: blood pressure returned to baseline and stable Postop Assessment: no apparent nausea or vomiting Anesthetic complications: no    Last Vitals:  Vitals:   02/16/17 1426 02/16/17 1430  BP:    Pulse: 95 96  Resp: 16 14  Temp:    SpO2: 95% 93%    Last Pain: There were no vitals filed for this visit.               Catalina Gravel

## 2017-02-16 NOTE — H&P (Signed)
Patrick Bautista is a 66 year-old male established patient who is here for renal calculi.  02/05/17: Patient presents today for acute workup of possible obstructing right ureteral stone. Patient notes 3 day history of right flank pain. He denies fevers or chills. He has had mild nausea or vomiting. Standing makes the pain better. Sitting makes it worse. He feels that this is similar to his previous stone episode.   CT shows 5-6 mm right proximal ureteral stone.   He did spontaneously pass a right ureteral calculus in 2017. He has no other history of stones. He has not required surgery for stones.   02/13/17: Patient returns today for follow up. Unfortunately, he was unable to tolerate Percocet or Vicodin he was given for pain due to nausea. Ketorolac was called in, but the pharmacy would not fill it due to him not having an injection first. He presents today with continued complaints of right flank pain, but states that it is not as severe. He has been using Advil for pain, which is helpful. He denies any increased in urgency or frequency. He denies any dysuria, gross hematuria, or fevers. He did have a low grade fever on 10/15 or 10/16 of 99.8. No fever since.   The problem is on the right side. He is currently having flank pain. He denies having back pain, groin pain, nausea, vomiting, fever, and chills. He has not caught a stone in his urine strainer since his symptoms began.     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  Has never drank.  Does not drink caffeine.    REVIEW OF SYSTEMS:    GU Review Male:   Patient denies frequent urination, hard to postpone urination, burning/ pain with urination, get up at night to urinate, leakage of urine, stream starts and stops, trouble starting your stream, have to strain to urinate , erection problems, and penile pain.  Gastrointestinal (Upper):   Patient denies nausea, vomiting, and indigestion/ heartburn.  Gastrointestinal (Lower):   Patient denies diarrhea and constipation.  Constitutional:   Patient denies fever, night sweats, weight loss, and fatigue.  Skin:   Patient denies skin rash/ lesion and itching.  Eyes:   Patient denies blurred vision and double vision.  Ears/ Nose/ Throat:   Patient denies sore throat and sinus problems.  Hematologic/Lymphatic:   Patient denies swollen glands and easy bruising.  Cardiovascular:   Patient denies leg swelling and chest pains.  Respiratory:   Patient denies cough and shortness of breath.  Endocrine:   Patient denies excessive thirst.  Musculoskeletal:   Patient reports back pain. Patient denies joint pain.  Neurological:   Patient denies headaches and dizziness.  Psychologic:   Patient denies depression and anxiety.   VITAL SIGNS:  02/13/2017 10:04 AM  BP 143/80 mmHg  Pulse 96 /min  Temperature 98.1 F / 36.7 C   MULTI-SYSTEM PHYSICAL EXAMINATION:    Constitutional: Well-nourished. No physical deformities. Normally developed. Good grooming.  Respiratory: Normal breath sounds. No labored breathing, no use of accessory muscles.   Cardiovascular: Regular rate and rhythm. No murmur, no gallop. Normal temperature, no swelling, no  varicosities.   Skin: No paleness, no jaundice, no cyanosis. No lesion, no ulcer, no rash.  Neurologic / Psychiatric: Oriented to time, oriented to place, oriented to person. No depression, no anxiety, no agitation.  Gastrointestinal: No mass, no tenderness, no rigidity, non obese abdomen.  Musculoskeletal: Spine, ribs, pelvis no bilateral tenderness. Normal gait and station of head and neck.     PAST DATA REVIEWED:  Source Of History:  Patient  Records Review:   Previous Patient Records  Urine Test Review:   Urinalysis, Urine Culture  X-Ray Review: C.T. Stone Protocol: Reviewed Films. Reviewed Report.     PROCEDURES:         KUB - [id number]  A single view of the abdomen is obtained. no obvious stones noted within the confines of bilateral renal shadows. I see no obvious stones noted along the anatomical tract of bilateral ureters. There are some areas of shadowing noted along the expected anatomical tract of the right ureter in the vicinity of the proximal ureter where his stone was noted on CT imaging. However, I cannot definitively say that this area represents his stone. I am suspicious based on Hounsfield units that his stone is radiolucent. Bladder appears free of obstruction. Normal bowel gas pattern. Stable pelvic calcifications.               Urinalysis Dipstick Dipstick Cont'd  Color: Yellow Bilirubin: Neg  Appearance: Clear Ketones: Neg  Specific Gravity: 1.025 Blood: Neg  pH: <=5.0 Protein: Neg  Glucose: 1+ Urobilinogen: 0.2    Nitrites: Neg    Leukocyte Esterase: Neg    ASSESSMENT:      ICD-10 Details  1 GU:   Ureteral calculus - N20.1    PLAN:            Medications New Meds: Hydromorphone Hcl 2 mg tablet 1 tablet PO Q 6 H PRN   #20  0 Refill(s)    Stop Meds: Ketorolac Tromethamine 10 mg tablet 1 tablet PO Q 6 H PRN Do not take this medication with Ibuprofen or Advil. Start: 02/08/2017  Discontinue: 02/13/2017  - Reason: The medication cycle was completed.             Orders Labs Urine Culture  X-Rays: KUB          Schedule Return Visit/Planned Activity: Other See Visit Notes          Document Letter(s):  Created for Patient: Clinical Summary         Notes:   I will send urine for culture today. I do see some areas of shadowing today in the vicinity of the right proximal ureter on imaging; however, I cannot say definitively one in the areas is his stone. Based on his symptoms, I do not think he has passed his stone at this point in time. He would like to discuss moving forward with management options at this time. We discussed that given the fact his stone is not well visualized on KUB that it would be best to proceed with ureteroscopy for management of his stone. For ureteroscopy I described the risks which include  heart attack, stroke, pulmonary embolus, death, bleeding, infection, damage to contiguous structures, positioning injury, ureteral stricture, ureteral avulsion, ureteral injury, need for ureteral stent,  inability to perform ureteroscopy, need for an interval procedure, inability to clear stone burden, stent discomfort and pain. He voiced understanding. I'll discuss the case and Dr. Louis Meckel and move forward with posting based on his recommendations. I will provide Rx for Hydromorphone today that he may use for severe pain. We discussed indications and side effects. I am hopeful that he will be able to tolerate this. Return precautions were given in the interim for fever or progressive pain, nausea or vomiting. We will be in contact with recommendations moving forward.

## 2017-02-16 NOTE — Discharge Instructions (Signed)
DISCHARGE INSTRUCTIONS FOR KIDNEY STONE/URETERAL STENT   MEDICATIONS:  1.  Resume all your other meds from home - except do not take any extra narcotic pain meds that you may have at home.  2. Pyridium is to help with the burning/stinging when you urinate. 3. Tramadol is for moderate/severe pain, otherwise taking upto 1000 mg every 6 hours of plainTylenol will help treat your pain.   4. Take Cipro one hour prior to removal of your stent.   ACTIVITY:  1. No strenuous activity x 1week  2. No driving while on narcotic pain medications  3. Drink plenty of water  4. Continue to walk at home - you can still get blood clots when you are at home, so keep active, but don't over do it.  5. May return to work/school tomorrow or when you feel ready   BATHING:  1. You can shower and we recommend daily showers  2. You have a string coming from your urethra: The stent string is attached to your ureteral stent. Do not pull on this.   SIGNS/SYMPTOMS TO CALL:  Please call Patrick Bautista if you have a fever greater than 101.5, uncontrolled nausea/vomiting, uncontrolled pain, dizziness, unable to urinate, bloody urine, chest pain, shortness of breath, leg swelling, leg pain, redness around wound, drainage from wound, or any other concerns or questions.   You can reach Patrick Bautista at [phone].   FOLLOW-UP:  1. You have an appointment in 6 [place] with a ultrasound of your kidneys prior.  2. You have a string attached to your stent, you may remove it on Wednesday October 31st. To do this, pull the strings until the stents are completely removed. You may feel an odd sensation in your back.    Ureteral Stent Implantation, Care After Refer to this sheet in the next few [place]. These instructions provide you with information about caring for yourself after your procedure. Your health care provider may also give you more specific instructions. Your treatment has been planned according to current medical practices, but problems  sometimes occur. Call your health care provider if you have any problems or questions after your procedure. What can I expect after the procedure? After the procedure, it is common to have:  Nausea.  Mild pain when you urinate. You may feel this pain in your lower back or lower abdomen. Pain should stop within a few minutes after you urinate. This may last for up to 1 week.  A small amount of blood in your urine for several days.  Follow these instructions at home:  Medicines  Take over-the-counter and prescription medicines only as told by your health care provider.  If you were prescribed an antibiotic medicine, take it as told by your health care provider. Do not stop taking the antibiotic even if you start to feel better.  Do not drive for 24 hours if you received a sedative.  Do not drive or operate heavy machinery while taking prescription pain medicines. Activity  Return to your normal activities as told by your health care provider. Ask your health care provider what activities are safe for you.  Do not lift anything that is heavier than 10 lb (4.5 kg). Follow this limit for 1 week after your procedure, or for as long as told by your health care provider. General instructions  Watch for any blood in your urine. Call your health care provider if the amount of blood in your urine increases.  If you have a catheter: ? Follow instructions  from your health care provider about taking care of your catheter and collection bag. ? Do not take baths, swim, or use a hot tub until your health care provider approves.  Drink enough fluid to keep your urine clear or pale yellow.  Keep all follow-up visits as told by your health care provider. This is important. Contact a health care provider if:  You have pain that gets worse or does not get better with medicine, especially pain when you urinate.  You have difficulty urinating.  You feel nauseous or you vomit repeatedly during a  period of more than 2 days after the procedure. Get help right away if:  Your urine is dark red or has blood clots in it.  You are leaking urine (have incontinence).  The end of the stent comes out of your urethra.  You cannot urinate.  You have sudden, sharp, or severe pain in your abdomen or lower back.  You have a fever. This information is not intended to replace advice given to you by your health care provider. Make sure you discuss any questions you have with your health care provider. Document Released: 12/11/2012 Document Revised: 09/16/2015 Document Reviewed: 10/23/2014 Elsevier Interactive Patient Education  2018 [place] Anesthesia, Adult, Care After These instructions provide you with information about caring for yourself after your procedure. Your health care provider may also give you more specific instructions. Your treatment has been planned according to current medical practices, but problems sometimes occur. Call your health care provider if you have any problems or questions after your procedure. What can I expect after the procedure? After the procedure, it is common to have:  Vomiting.  A sore throat.  Mental slowness.  It is common to feel:  Nauseous.  Cold or shivery.  Sleepy.  Tired.  Sore or achy, even in parts of your body where you did not have surgery.  Follow these instructions at home: For at least 24 hours after the procedure:  Do not: ? Participate in activities where you could fall or become injured. ? Drive. ? Use heavy machinery. ? Drink alcohol. ? Take sleeping pills or medicines that cause drowsiness. ? Make important decisions or sign legal documents. ? Take care of children on your own.  Rest. Eating and drinking  If you vomit, drink water, juice, or soup when you can drink without vomiting.  Drink enough fluid to keep your urine clear or pale yellow.  Make sure you have little or no nausea before eating  solid foods.  Follow the diet recommended by your health care provider. General instructions  Have a responsible adult stay with you until you are awake and alert.  Return to your normal activities as told by your health care provider. Ask your health care provider what activities are safe for you.  Take over-the-counter and prescription medicines only as told by your health care provider.  If you smoke, do not smoke without supervision.  Keep all follow-up visits as told by your health care provider. This is important. Contact a health care provider if:  You continue to have nausea or vomiting at home, and medicines are not helpful.  You cannot drink fluids or start eating again.  You cannot urinate after 8-12 hours.  You develop a skin rash.  You have fever.  You have increasing redness at the site of your procedure. Get help right away if:  You have difficulty breathing.  You have chest pain.  You have unexpected  bleeding.  You feel that you are having a life-threatening or urgent problem. This information is not intended to replace advice given to you by your health care provider. Make sure you discuss any questions you have with your health care provider. Document Released: 07/17/2000 Document Revised: 09/13/2015 Document Reviewed: 03/25/2015 Elsevier Interactive Patient Education  [employer].

## 2017-02-16 NOTE — Interval H&P Note (Signed)
History and Physical Interval Note:  02/16/2017 12:27 PM  Patrick Bautista  has presented today for surgery, with the diagnosis of RIGHT URETERAL STONE  The various methods of treatment have been discussed with the patient and family. After consideration of risks, benefits and other options for treatment, the patient has consented to  Procedure(s) with comments: CYSTOSCOPY/RETROGRADE/URETEROSCOPY/HOLMIUM LASER/STENT PLACEMENT (Right) - NEEDS 60 MIN FOR PROCEDURE as a surgical intervention .  The patient's history has been reviewed, patient examined, no change in status, stable for surgery.  I have reviewed the patient's chart and labs.  Questions were answered to the patient's satisfaction.     Louis Meckel W

## 2017-02-16 NOTE — Op Note (Signed)
Preoperative diagnosis:  1. Right proximal ureteral stone  Postoperative diagnosis:  1. Same  Procedure: 1. Cystoscopy, retrograde pyelogram with interpretation 2. Right ureteral balloon dilation 3. Right ureteroscopy, laser lithotripsy, stone extraction 4. Right ureteral stent placement  Surgeon: Ardis Hughs, MD  Anesthesia: General  Complications: None  Intraoperative findings:  #1: 10 cc of Omnipaque contrast was used to perform a right retrograde pyelogram.  This demonstrated a normal caliber ureter proximally with a large filling defect in the proximal ureter and associated proximal hydroureteronephrosis. #2: The patient had a very tight ureter approximately requiring balloon dilation to accommodate the ureteroscope. #3: The stone was pushed into the renal pelvis and then dusted.  A small fragment was removed and will be sent for stone analysis.  EBL: Minimal  Specimens: Small fragment of the patient's stone will be [facility] for further stone composition analysis  Indication: Patrick Bautista is a 66 y.o. patient with symptomatic right-sided ureteral colic secondary to a proximal ureteral stone he is failed medical expulsive therapy..  After reviewing the management options for treatment, he elected to proceed with the above surgical procedure(s). We have discussed the potential benefits and risks of the procedure, side effects of the proposed treatment, the likelihood of the patient achieving the goals of the procedure, and any potential problems that might occur during the procedure or recuperation. Informed consent has been obtained.  Description of procedure:  The patient was taken to the operating room and general anesthesia was induced.  The patient was placed in the dorsal lithotomy position, prepped and draped in the usual sterile fashion, and preoperative antibiotics were administered. A preoperative time-out was performed.   At 30 degrees  21 French cystoscope was gently passed through the patient's urethra and into the bladder under visual guidance.  360 degrees discomfort evaluation was performed noting orthotopic ureteral orifice ease.  There were no stones, tumors, or mucosal abnormalities.  I then cannulated the right ureteral orifice with a 5 [place] open-ended ureteral catheter and advanced it into the distal ureter.  I then performed retrograde Polygram with the above findings.  I then advanced a 0.38 sensor wire through the open-ended catheter and up into the right renal pelvis and backed out the open-ended catheter.  I then emptied the bladder and removed the cystoscope.  I then advanced a 6 French semirigid ureteroscope into the patient's bladder under visual guidance.  I was able to cannulate the right ureteral orifice using a second wire.  I then advanced a second wire into the proximal ureter and navigated into the patient's mid ureter with the assistance of the second wire.  Note that the ureter was very tight at this point.  Ultimately I was able to encounter the stone.  However, I was unable to get a sufficient angle given the narrow and noncompliant ureter to laser the stone at this point.  I then tried to move the stone more distally to obtain a better angle for fragmentation unsuccessfully.  Ultimately, I opted at this point to push the stone into the renal pelvis and I advanced the second wire through the semirigid ureteroscope and into the right renal pelvis.  I then backed the scope out over the wire.  I then tried to advance a 12/14 [place] times 38 cm ureteral access sheath over the second wire unsuccessfully.  I was unable to get it beyond the ureteral orifice.  I then used a 15 [place] times 4 cm balloon dilator and  dilated the distal ureter and 2 spots in the routine fashion.  I then was able to get the ureteral access sheath beyond these 2 areas and into the distal/mid ureter.  Removing the inner portion of the access sheath  I was able to advance the flexible ureteroscope into the proximal ureter and then ended the renal pelvis.  I irrigated out the pelvis and encountered the stone in the mid portion or midpole.  Using a 200 m laser fiber I dusted the stone into very small fragments.  There were no large fragments remaining.  I was able to grasp a small cyst stone fragment with a stone basket and removed this to be taken for stone analysis.  I then slowly backed out the ureteroscope inspecting the ureter to ensure there was no significant trauma.  The access sheath was simultaneously removed.  I then backloaded the sensor wire through the 30 degree cystoscope and then repassed the cystoscope into the patient's bladder.  I advanced that the 28 cm x6 French double-J ureteral stent over the sensor wire and into the right renal pelvis under fluoroscopic guidance using the Seldinger technique.  Once the stent was known to be adequately positioned in the renal pelvis I backed the scope up to the bladder neck and advanced the stent until this stent and was at the bladder neck.  I then removed the wire noting a nice curl in the patient's bladder.  Confirmation of adequate position was obtained with fluoroscopy.  The stent tether was then secured to the top of the patient's penis.  The patient was subsequently extubated return to the PACU in stable condition.  Ardis Hughs, M.D.

## 2017-02-16 NOTE — Transfer of Care (Signed)
Immediate Anesthesia Transfer of Care Note  Patient: Patrick Bautista  Procedure(s) Performed: CYSTOSCOPY/RETROGRADE/URETEROSCOPY/HOLMIUM LASER/STENT PLACEMENT (Right Ureter)  Patient Location: PACU  Anesthesia Type:General  Level of Consciousness: awake, alert  and oriented  Airway & Oxygen Therapy: Patient Spontanous Breathing and Patient connected to face mask oxygen  Post-op Assessment: Report given to RN and Post -op Vital signs reviewed and stable  Post vital signs: Reviewed and stable  Last Vitals:  Vitals:   02/16/17 1413  BP: (P) 110/90  Temp: (P) 37.1 C    Last Pain: There were no vitals filed for this visit.    Patients Stated Pain Goal: 4 (75/17/00 1749)  Complications: No apparent anesthesia complications

## 2017-02-16 NOTE — Anesthesia Procedure Notes (Signed)
Procedure Name: LMA Insertion Date/Time: 02/16/2017 12:37 PM Performed by: Maxwell Caul Pre-anesthesia Checklist: Patient identified, Emergency Drugs available, Suction available and Patient being monitored Patient Re-evaluated:Patient Re-evaluated prior to induction Oxygen Delivery Method: Circle system utilized Preoxygenation: Pre-oxygenation with 100% oxygen Induction Type: IV induction LMA: LMA inserted LMA Size: 5.0 Number of attempts: 1 Placement Confirmation: positive ETCO2 and breath sounds checked- equal and bilateral Tube secured with: Tape (Plastic tape.) Dental Injury: Teeth and Oropharynx as per pre-operative assessment

## 2017-02-16 NOTE — Anesthesia Preprocedure Evaluation (Addendum)
Anesthesia Evaluation  Patient identified by MRN, date of birth, ID band Patient awake    Reviewed: Allergy & Precautions, NPO status , Patient's Chart, lab work & pertinent test results  Airway Mallampati: II  TM Distance: <3 FB Neck ROM: Full    Dental  (+) Teeth Intact, Dental Advisory Given, Chipped,  Two permanent upper bridges :   Pulmonary neg pulmonary ROS,    Pulmonary exam normal breath sounds clear to auscultation       Cardiovascular hypertension, Pt. on medications Normal cardiovascular exam Rhythm:Regular Rate:Normal     Neuro/Psych negative neurological ROS  negative psych ROS   GI/Hepatic negative GI ROS, Neg liver ROS,   Endo/Other  diabetes, Type 2, Oral Hypoglycemic AgentsObesity   Renal/GU negative Renal ROS     Musculoskeletal  (+) Arthritis ,   Abdominal   Peds  Hematology  (+) Blood dyscrasia (Thrombocytosis), anemia ,   Anesthesia Other Findings Day of surgery medications reviewed with the patient.  Reproductive/Obstetrics                           Anesthesia Physical Anesthesia Plan  ASA: II  Anesthesia Plan: General   Post-op Pain Management:    Induction: Intravenous  PONV Risk Score and Plan: 3 and Ondansetron, Dexamethasone and Midazolam  Airway Management Planned: LMA  Additional Equipment:   Intra-op Plan:   Post-operative Plan: Extubation in OR  Informed Consent: I have reviewed the patients History and Physical, chart, labs and discussed the procedure including the risks, benefits and alternatives for the proposed anesthesia with the patient or authorized representative who has indicated his/her understanding and acceptance.   Dental advisory given  Plan Discussed with: CRNA  Anesthesia Plan Comments: (Risks/benefits of general anesthesia discussed with patient including risk of damage to teeth, lips, gum, and tongue, nausea/vomiting,  allergic reactions to medications, and the possibility of heart attack, stroke and death.  All patient questions answered.  Patient wishes to proceed.)        Anesthesia Quick Evaluation

## 2017-02-16 NOTE — Progress Notes (Signed)
Pt HR at discharge was 97-105, pt asymptomatic. Denies cp, sob, dizziness. Pt told to return to any emergency room if any of these symptoms arise. PT alert and oriented at discharge, no pain.

## 2019-02-04 ENCOUNTER — Other Ambulatory Visit: Payer: Self-pay | Admitting: Nephrology

## 2019-02-05 ENCOUNTER — Other Ambulatory Visit: Payer: Self-pay | Admitting: Nephrology

## 2019-02-05 DIAGNOSIS — N183 Chronic kidney disease, stage 3 unspecified: Secondary | ICD-10-CM

## 2019-02-07 ENCOUNTER — Ambulatory Visit
Admission: RE | Admit: 2019-02-07 | Discharge: 2019-02-07 | Disposition: A | Discharge status: Home / Self Care | Payer: Medicare Other | Source: Ambulatory Visit | Attending: Nephrology | Admitting: Nephrology

## 2019-02-07 DIAGNOSIS — N183 Chronic kidney disease, stage 3 unspecified: Secondary | ICD-10-CM

## 2019-08-27 ENCOUNTER — Other Ambulatory Visit: Payer: Self-pay | Admitting: Urology

## 2019-08-27 NOTE — Patient Instructions (Addendum)
DUE TO COVID-19 ONLY ONE VISITOR IS ALLOWED TO COME WITH YOU AND STAY IN THE WAITING ROOM ONLY DURING PRE OP AND PROCEDURE DAY OF SURGERY. THE 2 VISITOR MAY VISIT WITH YOU AFTER SURGERY IN YOUR PRIVATE ROOM DURING VISITING HOURS ONLY!  YOU NEED TO HAVE A COVID 19 TEST ON__5/10_____ @_10 :15______, THIS TEST MUST BE DONE BEFORE SURGERY, COME  Patrick Bautista , [id number].  ([place]) ONCE YOUR COVID TEST IS COMPLETED, PLEASE BEGIN THE QUARANTINE INSTRUCTIONS AS OUTLINED IN YOUR HANDOUT.                Patrick Bautista    Your procedure is scheduled on: 09/03/19   Report to [facility] Main  Entrance   Report to admitting at 6:30 AM     Call this number if you have problems the morning of surgery [phone]    Remember: Do not eat food or drink liquids :After Midnight.   BRUSH YOUR TEETH MORNING OF SURGERY AND RINSE YOUR MOUTH OUT, NO CHEWING GUM CANDY OR MINTS.     Take these medicines the morning of surgery with A SIP OF WATER: Amlodipine  DO NOT TAKE ANY DIABETIC MEDICATIONS DAY OF YOUR SURGERY        How to Manage Your Diabetes Before and After Surgery  Why is it important to control my blood sugar before and after surgery? . Improving blood sugar levels before and after surgery helps healing and can limit problems. . A way of improving blood sugar control is eating a healthy diet by: o  Eating less sugar and carbohydrates o  Increasing activity/exercise o  Talking with your doctor about reaching your blood sugar goals . High blood sugars (greater than 180 mg/dL) can raise your risk of infections and slow your recovery, so you will need to focus on controlling your diabetes during the [place] before surgery. . Make sure that the doctor who takes care of your diabetes knows about your planned surgery including the date and location.  How do I manage my blood sugar before surgery? . Check your blood sugar at least 4 times a day, starting 2 days  before surgery, to make sure that the level is not too high or low. o Check your blood sugar the morning of your surgery when you wake up and every 2 hours until you get to the Short Stay unit. . If your blood sugar is less than 70 mg/dL, you will need to treat for low blood sugar: o Do not take insulin. o Treat a low blood sugar (less than 70 mg/dL) with  cup of clear juice (cranberry or apple), 4 glucose tablets, OR glucose gel. o Recheck blood sugar in 15 minutes after treatment (to make sure it is greater than 70 mg/dL). If your blood sugar is not greater than 70 mg/dL on recheck, call [phone] for further instructions. . Report your blood sugar to the short stay nurse when you get to Short Stay.  . If you are admitted to the hospital after surgery: o Your blood sugar will be checked by the staff and you will probably be given insulin after surgery (instead of oral diabetes medicines) to make sure you have good blood sugar levels. o The goal for blood sugar control after surgery is 80-180 mg/dL.   WHAT DO I DO ABOUT MY DIABETES MEDICATION?  Marland Kitchen Do not take oral diabetes medicines (pills) the morning of surgery.  You may not have any metal on your body including              piercings  Do not wear jewelry,lotions, powders or deodorant             Men may shave face and neck.   Do not bring valuables to the hospital. [place].  Contacts, dentures or bridgework may not be worn into surgery.       Patients discharged the day of surgery will not be allowed to drive home  . IF YOU ARE HAVING SURGERY AND GOING HOME THE SAME DAY, YOU MUST HAVE AN ADULT TO DRIVE YOU HOME AND BE WITH YOU FOR 24 HOURS.   YOU MAY GO HOME BY TAXI OR UBER OR ORTHERWISE, BUT AN ADULT MUST ACCOMPANY YOU HOME AND STAY WITH YOU FOR 24 HOURS.  Name and phone number of your driver:  Special Instructions: N/A              Please read over the  following fact sheets you were given: _____________________________________________________________________             [facility] - Preparing for Surgery Before surgery, you can play an important role.   Because skin is not sterile, your skin needs to be as free of germs as possible.   You can reduce the number of germs on your skin by washing with CHG (chlorahexidine gluconate) soap before surgery.   CHG is an antiseptic cleaner which kills germs and bonds with the skin to continue killing germs even after washing. Please DO NOT use if you have an allergy to CHG or antibacterial soaps.   If your skin becomes reddened/irritated stop using the CHG and inform your nurse when you arrive at Short Stay.   You may shave your face/neck.  Please follow these instructions carefully:  1.  Shower with CHG Soap the night before surgery and the  morning of Surgery.  2.  If you choose to wash your hair, wash your hair first as usual with your  normal  shampoo.  3.  After you shampoo, rinse your hair and body thoroughly to remove the  shampoo.                                        4.  Use CHG as you would any other liquid soap.  You can apply chg directly  to the skin and wash                       Gently with a scrungie or clean washcloth.  5.  Apply the CHG Soap to your body ONLY FROM THE NECK DOWN.   Do not use on face/ open                           Wound or open sores. Avoid contact with eyes, ears mouth and genitals (private parts).                       Wash face,  Genitals (private parts) with your normal soap.             6.  Wash thoroughly, paying special attention to the  area where your surgery  will be performed.  7.  Thoroughly rinse your body with warm water from the neck down.  8.  DO NOT shower/wash with your normal soap after using and rinsing off  the CHG Soap.             9.  Pat yourself dry with a clean towel.            10.  Wear clean pajamas.            11.  Place clean  sheets on your bed the night of your first shower and do not  sleep with pets. Day of Surgery : Do not apply any lotions/deodorants the morning of surgery.  Please wear clean clothes to the hospital/surgery center.  FAILURE TO FOLLOW THESE INSTRUCTIONS MAY RESULT IN THE CANCELLATION OF YOUR SURGERY PATIENT SIGNATURE_________________________________  NURSE SIGNATURE__________________________________  ________________________________________________________________________

## 2019-08-28 ENCOUNTER — Other Ambulatory Visit: Payer: Self-pay

## 2019-08-28 ENCOUNTER — Encounter (HOSPITAL_COMMUNITY): Payer: Self-pay

## 2019-08-28 ENCOUNTER — Encounter (HOSPITAL_COMMUNITY)
Admission: RE | Admit: 2019-08-28 | Discharge: 2019-08-28 | Disposition: A | Discharge status: Home / Self Care | Payer: Medicare Other | Source: Ambulatory Visit | Attending: Urology | Admitting: Urology

## 2019-08-28 DIAGNOSIS — Z01812 Encounter for preprocedural laboratory examination: Secondary | ICD-10-CM | POA: Diagnosis not present

## 2019-08-28 NOTE — Progress Notes (Signed)
PCP -Dr. Orlene Erm  Cardiologist - no  Chest x-ray - no EKG - 09/01/19 Stress Test - no ECHO - no Cardiac Cath - no  Sleep Study - no CPAP -   Fasting Blood Sugar - 130-170 Checks Blood Sugar _QD____ times a day  Blood Thinner Instructions:NA Aspirin Instructions: Last Dose:  Anesthesia review:   Patient denies shortness of breath, fever, cough and chest pain at PAT appointment yes  Patient verbalized understanding of instructions that were given to them at the PAT appointment. Patient was also instructed that they will need to review over the PAT instructions again at home before surgery. yes

## 2019-09-01 ENCOUNTER — Encounter (HOSPITAL_COMMUNITY)
Admission: RE | Admit: 2019-09-01 | Discharge: 2019-09-01 | Disposition: A | Discharge status: Home / Self Care | Payer: Medicare Other | Source: Ambulatory Visit | Attending: Urology | Admitting: Urology

## 2019-09-01 ENCOUNTER — Other Ambulatory Visit: Payer: Self-pay

## 2019-09-01 ENCOUNTER — Other Ambulatory Visit (HOSPITAL_COMMUNITY)
Admission: RE | Admit: 2019-09-01 | Discharge: 2019-09-01 | Disposition: A | Discharge status: Home / Self Care | Payer: Medicare Other | Source: Ambulatory Visit | Attending: Urology | Admitting: Urology

## 2019-09-01 DIAGNOSIS — Z20822 Contact with and (suspected) exposure to covid-19: Secondary | ICD-10-CM | POA: Diagnosis not present

## 2019-09-01 DIAGNOSIS — Z01818 Encounter for other preprocedural examination: Secondary | ICD-10-CM | POA: Insufficient documentation

## 2019-09-01 DIAGNOSIS — I440 Atrioventricular block, first degree: Secondary | ICD-10-CM | POA: Insufficient documentation

## 2019-09-01 LAB — CBC
HCT: 39.3 % (ref 39.0–52.0)
Hemoglobin: 12.7 g/dL — ABNORMAL LOW (ref 13.0–17.0)
MCH: 28.9 pg (ref 26.0–34.0)
MCHC: 32.3 g/dL (ref 30.0–36.0)
MCV: 89.5 fL (ref 80.0–100.0)
Platelets: 258 10*3/uL (ref 150–400)
RBC: 4.39 MIL/uL (ref 4.22–5.81)
RDW: 14.1 % (ref 11.5–15.5)
WBC: 7.5 10*3/uL (ref 4.0–10.5)
nRBC: 0 % (ref 0.0–0.2)

## 2019-09-01 LAB — BASIC METABOLIC PANEL
Anion gap: 9 (ref 5–15)
BUN: 50 mg/dL — ABNORMAL HIGH (ref 8–23)
CO2: 23 mmol/L (ref 22–32)
Calcium: 10.2 mg/dL (ref 8.9–10.3)
Chloride: 107 mmol/L (ref 98–111)
Creatinine, Ser: 1.91 mg/dL — ABNORMAL HIGH (ref 0.61–1.24)
GFR calc Af Amer: 41 mL/min — ABNORMAL LOW (ref >60)
GFR calc non Af Amer: 35 mL/min — ABNORMAL LOW (ref >60)
Glucose, Bld: 177 mg/dL — ABNORMAL HIGH (ref 70–99)
Potassium: 5.1 mmol/L (ref 3.5–5.1)
Sodium: 139 mmol/L (ref 135–145)

## 2019-09-01 LAB — HEMOGLOBIN A1C
Hgb A1c MFr Bld: 8.7 % — ABNORMAL HIGH (ref 4.8–5.6)
Mean Plasma Glucose: 202.99 mg/dL

## 2019-09-01 LAB — SARS CORONAVIRUS 2 (TAT 6-24 HRS): SARS Coronavirus 2: NEGATIVE

## 2019-09-01 LAB — GLUCOSE, CAPILLARY: Glucose-Capillary: 179 mg/dL — ABNORMAL HIGH (ref 70–99)

## 2019-09-02 NOTE — Anesthesia Preprocedure Evaluation (Addendum)
Anesthesia Evaluation  Patient identified by MRN, date of birth, ID band Patient awake    Reviewed: Allergy & Precautions, NPO status , Patient's Chart, lab work & pertinent test results  Airway Mallampati: III  TM Distance: >3 FB Neck ROM: Full    Dental  (+) Dental Advisory Given   Pulmonary neg pulmonary ROS,    breath sounds clear to auscultation       Cardiovascular hypertension, Pt. on medications  Rhythm:Regular Rate:Normal     Neuro/Psych negative neurological ROS     GI/Hepatic negative GI ROS, Neg liver ROS,   Endo/Other  diabetes, Type 2  Renal/GU Renal disease     Musculoskeletal  (+) Arthritis ,   Abdominal   Peds  Hematology  (+) anemia ,   Anesthesia Other Findings   Reproductive/Obstetrics                            Lab Results  Component Value Date   WBC 7.5 09/01/2019   HGB 12.7 (L) 09/01/2019   HCT 39.3 09/01/2019   MCV 89.5 09/01/2019   PLT 258 09/01/2019   Lab Results  Component Value Date   CREATININE 1.91 (H) 09/01/2019   BUN 50 (H) 09/01/2019   NA 139 09/01/2019   K 5.1 09/01/2019   CL 107 09/01/2019   CO2 23 09/01/2019    Anesthesia Physical Anesthesia Plan  ASA: II  Anesthesia Plan: General   Post-op Pain Management:    Induction: Intravenous  PONV Risk Score and Plan: 2 and Dexamethasone, Ondansetron and Treatment may vary due to age or medical condition  Airway Management Planned: LMA  Additional Equipment: None  Intra-op Plan:   Post-operative Plan: Extubation in OR  Informed Consent: I have reviewed the patients History and Physical, chart, labs and discussed the procedure including the risks, benefits and alternatives for the proposed anesthesia with the patient or authorized representative who has indicated his/her understanding and acceptance.     Dental advisory given  Plan Discussed with: CRNA  Anesthesia Plan Comments:         Anesthesia Quick Evaluation

## 2019-09-03 ENCOUNTER — Ambulatory Visit (HOSPITAL_COMMUNITY): Payer: Medicare Other | Admitting: Certified Registered Nurse Anesthetist

## 2019-09-03 ENCOUNTER — Ambulatory Visit (HOSPITAL_COMMUNITY)
Admission: RE | Admit: 2019-09-03 | Discharge: 2019-09-03 | Disposition: A | Discharge status: Home / Self Care | Payer: Medicare Other | Attending: Urology | Admitting: Urology

## 2019-09-03 ENCOUNTER — Encounter (HOSPITAL_COMMUNITY): Payer: Self-pay | Admitting: Urology

## 2019-09-03 ENCOUNTER — Encounter (HOSPITAL_COMMUNITY)
Admission: RE | Disposition: A | Discharge status: Home / Self Care | Payer: Self-pay | Source: Home / Self Care | Attending: Urology

## 2019-09-03 ENCOUNTER — Ambulatory Visit (HOSPITAL_COMMUNITY): Payer: Medicare Other | Admitting: Physician Assistant

## 2019-09-03 ENCOUNTER — Other Ambulatory Visit: Payer: Self-pay

## 2019-09-03 DIAGNOSIS — Z885 Allergy status to narcotic agent status: Secondary | ICD-10-CM | POA: Insufficient documentation

## 2019-09-03 DIAGNOSIS — C6212 Malignant neoplasm of descended left testis: Secondary | ICD-10-CM | POA: Diagnosis not present

## 2019-09-03 DIAGNOSIS — E109 Type 1 diabetes mellitus without complications: Secondary | ICD-10-CM | POA: Insufficient documentation

## 2019-09-03 DIAGNOSIS — I10 Essential (primary) hypertension: Secondary | ICD-10-CM | POA: Diagnosis not present

## 2019-09-03 DIAGNOSIS — Z880 Allergy status to penicillin: Secondary | ICD-10-CM | POA: Insufficient documentation

## 2019-09-03 DIAGNOSIS — N509 Disorder of male genital organs, unspecified: Secondary | ICD-10-CM | POA: Diagnosis present

## 2019-09-03 DIAGNOSIS — N5089 Other specified disorders of the male genital organs: Secondary | ICD-10-CM

## 2019-09-03 HISTORY — PX: ORCHIECTOMY: SHX2116

## 2019-09-03 LAB — GLUCOSE, CAPILLARY
Glucose-Capillary: 161 mg/dL — ABNORMAL HIGH (ref 70–99)
Glucose-Capillary: 151 mg/dL — ABNORMAL HIGH (ref 70–99)

## 2019-09-03 SURGERY — ORCHIECTOMY
Anesthesia: General | Laterality: Left

## 2019-09-03 MED ORDER — 0.9 % SODIUM CHLORIDE (POUR BTL) OPTIME
TOPICAL | Status: DC | PRN
  Administered 2019-09-03: 1000 mL

## 2019-09-03 MED ORDER — LIDOCAINE 2% (20 MG/ML) 5 ML SYRINGE
INTRAMUSCULAR | Status: AC
  Filled 2019-09-03: qty 5

## 2019-09-03 MED ORDER — BUPIVACAINE HCL (PF) 0.25 % IJ SOLN
INTRAMUSCULAR | Status: DC | PRN
  Administered 2019-09-03: 16 mL

## 2019-09-03 MED ORDER — ONDANSETRON HCL 4 MG/2ML IJ SOLN: 4.0000 mg | Freq: Once | INTRAMUSCULAR | Status: DC | PRN

## 2019-09-03 MED ORDER — DEXAMETHASONE SODIUM PHOSPHATE 4 MG/ML IJ SOLN
INTRAMUSCULAR | Status: DC | PRN
  Administered 2019-09-03: 5 mg via INTRAVENOUS

## 2019-09-03 MED ORDER — BUPIVACAINE HCL (PF) 0.5 % IJ SOLN
INTRAMUSCULAR | Status: DC | PRN
  Administered 2019-09-03: 16 mL

## 2019-09-03 MED ORDER — MIDAZOLAM HCL 2 MG/2ML IJ SOLN
INTRAMUSCULAR | Status: AC
  Filled 2019-09-03: qty 2

## 2019-09-03 MED ORDER — TRAMADOL HCL 50 MG PO TABS: 50.0000 mg | ORAL_TABLET | Freq: Four times a day (QID) | ORAL | 0 refills | Status: DC | PRN

## 2019-09-03 MED ORDER — MIDAZOLAM HCL 2 MG/2ML IJ SOLN
INTRAMUSCULAR | Status: DC | PRN
  Administered 2019-09-03: 2 mg via INTRAVENOUS

## 2019-09-03 MED ORDER — EPHEDRINE SULFATE-NACL 50-0.9 MG/10ML-% IV SOSY
PREFILLED_SYRINGE | INTRAVENOUS | Status: DC | PRN
  Administered 2019-09-03 (×4): 5 mg via INTRAVENOUS
  Administered 2019-09-03: 10 mg via INTRAVENOUS

## 2019-09-03 MED ORDER — DEXAMETHASONE SODIUM PHOSPHATE 4 MG/ML IJ SOLN: INTRAMUSCULAR | Status: DC | PRN

## 2019-09-03 MED ORDER — KETOROLAC TROMETHAMINE 15 MG/ML IJ SOLN
INTRAMUSCULAR | Status: AC
  Filled 2019-09-03: qty 1

## 2019-09-03 MED ORDER — FENTANYL CITRATE (PF) 100 MCG/2ML IJ SOLN
INTRAMUSCULAR | Status: DC | PRN
  Administered 2019-09-03 (×4): 50 ug via INTRAVENOUS

## 2019-09-03 MED ORDER — LACTATED RINGERS IV SOLN
INTRAVENOUS | Status: DC | PRN
Start: 2019-09-03 — End: 2019-09-03

## 2019-09-03 MED ORDER — PHENYLEPHRINE 40 MCG/ML (10ML) SYRINGE FOR IV PUSH (FOR BLOOD PRESSURE SUPPORT)
PREFILLED_SYRINGE | INTRAVENOUS | Status: DC | PRN
  Administered 2019-09-03: 120 ug via INTRAVENOUS
  Administered 2019-09-03 (×3): 80 ug via INTRAVENOUS
  Administered 2019-09-03: 120 ug via INTRAVENOUS
  Administered 2019-09-03: 80 ug via INTRAVENOUS

## 2019-09-03 MED ORDER — ONDANSETRON HCL 4 MG/2ML IJ SOLN
INTRAMUSCULAR | Status: AC
  Filled 2019-09-03: qty 2

## 2019-09-03 MED ORDER — CLINDAMYCIN PHOSPHATE 600 MG/50ML IV SOLN
600.0000 mg | INTRAVENOUS | Status: AC
  Administered 2019-09-03: 600 mg via INTRAVENOUS
  Filled 2019-09-03: qty 50

## 2019-09-03 MED ORDER — FENTANYL CITRATE (PF) 100 MCG/2ML IJ SOLN
INTRAMUSCULAR | Status: AC
  Filled 2019-09-03: qty 2

## 2019-09-03 MED ORDER — EPHEDRINE 5 MG/ML INJ
INTRAVENOUS | Status: AC
  Filled 2019-09-03: qty 10

## 2019-09-03 MED ORDER — BUPIVACAINE HCL (PF) 0.5 % IJ SOLN
INTRAMUSCULAR | Status: AC
  Filled 2019-09-03: qty 30

## 2019-09-03 MED ORDER — KETOROLAC TROMETHAMINE 15 MG/ML IJ SOLN
15.0000 mg | Freq: Once | INTRAMUSCULAR | Status: AC
  Administered 2019-09-03: 11:00:00 15 mg via INTRAVENOUS

## 2019-09-03 MED ORDER — FENTANYL CITRATE (PF) 100 MCG/2ML IJ SOLN: 25.0000 ug | INTRAMUSCULAR | Status: DC | PRN

## 2019-09-03 MED ORDER — ACETAMINOPHEN 500 MG PO TABS
1000.0000 mg | ORAL_TABLET | Freq: Once | ORAL | Status: AC
  Administered 2019-09-03: 1000 mg via ORAL
  Filled 2019-09-03: qty 2

## 2019-09-03 MED ORDER — TRAMADOL HCL 50 MG PO TABS
ORAL_TABLET | ORAL | Status: AC
  Administered 2019-09-03: 50 mg via ORAL
  Filled 2019-09-03: qty 2

## 2019-09-03 MED ORDER — PHENYLEPHRINE 40 MCG/ML (10ML) SYRINGE FOR IV PUSH (FOR BLOOD PRESSURE SUPPORT)
PREFILLED_SYRINGE | INTRAVENOUS | Status: AC
  Filled 2019-09-03: qty 10

## 2019-09-03 MED ORDER — PROPOFOL 10 MG/ML IV BOLUS
INTRAVENOUS | Status: DC | PRN
  Administered 2019-09-03: 160 mg via INTRAVENOUS

## 2019-09-03 MED ORDER — PROPOFOL 500 MG/50ML IV EMUL
INTRAVENOUS | Status: AC
  Filled 2019-09-03: qty 50

## 2019-09-03 MED ORDER — DEXAMETHASONE SODIUM PHOSPHATE 10 MG/ML IJ SOLN
INTRAMUSCULAR | Status: AC
  Filled 2019-09-03: qty 1

## 2019-09-03 MED ORDER — BUPIVACAINE HCL 0.25 % IJ SOLN
INTRAMUSCULAR | Status: AC
  Filled 2019-09-03: qty 1

## 2019-09-03 MED ORDER — LIDOCAINE 2% (20 MG/ML) 5 ML SYRINGE
INTRAMUSCULAR | Status: DC | PRN
  Administered 2019-09-03: 60 mg via INTRAVENOUS

## 2019-09-03 MED ORDER — ONDANSETRON HCL 4 MG/2ML IJ SOLN
INTRAMUSCULAR | Status: DC | PRN
  Administered 2019-09-03: 4 mg via INTRAVENOUS

## 2019-09-03 MED ORDER — TRAMADOL HCL 50 MG PO TABS: 100.0000 mg | ORAL_TABLET | Freq: Once | ORAL | Status: AC | PRN

## 2019-09-03 SURGICAL SUPPLY — 38 items
ADH SKN CLS APL DERMABOND .7 (GAUZE/BANDAGES/DRESSINGS) ×1
APL SKNCLS STERI-STRIP NONHPOA (GAUZE/BANDAGES/DRESSINGS)
BENZOIN TINCTURE PRP APPL 2/3 (GAUZE/BANDAGES/DRESSINGS) IMPLANT
BNDG GAUZE ELAST 4 BULKY (GAUZE/BANDAGES/DRESSINGS) IMPLANT
CLOSURE WOUND 1/2 X4 (GAUZE/BANDAGES/DRESSINGS)
COVER SURGICAL LIGHT HANDLE (MISCELLANEOUS) ×3 IMPLANT
COVER WAND RF STERILE (DRAPES) IMPLANT
DERMABOND ADVANCED (GAUZE/BANDAGES/DRESSINGS) ×2
DERMABOND ADVANCED .7 DNX12 (GAUZE/BANDAGES/DRESSINGS) ×1 IMPLANT
DISSECTOR ROUND CHERRY 3/8 STR (MISCELLANEOUS) IMPLANT
DRAIN PENROSE 0.25X18 (DRAIN) ×3 IMPLANT
DRAPE LAPAROTOMY T 98X78 PEDS (DRAPES) ×3 IMPLANT
ELECT PENCIL ROCKER SW 15FT (MISCELLANEOUS) ×2 IMPLANT
ELECT REM PT RETURN 15FT ADLT (MISCELLANEOUS) ×3 IMPLANT
GAUZE SPONGE 4X4 12PLY STRL (GAUZE/BANDAGES/DRESSINGS) ×2 IMPLANT
GLOVE BIOGEL M STRL SZ7.5 (GLOVE) ×3 IMPLANT
GOWN STRL REUS W/TWL XL LVL3 (GOWN DISPOSABLE) ×3 IMPLANT
KIT BASIN (CUSTOM PROCEDURE TRAY) ×3 IMPLANT
KIT TURNOVER KIT A (KITS) IMPLANT
NEEDLE HYPO 22GX1.5 SAFETY (NEEDLE) ×3 IMPLANT
NS IRRIG 1000ML POUR BTL (IV SOLUTION) ×3 IMPLANT
PACK GENERAL/GYN (CUSTOM PROCEDURE TRAY) ×3 IMPLANT
PENCIL SMOKE EVACUATOR (MISCELLANEOUS) IMPLANT
STRIP CLOSURE SKIN 1/2X4 (GAUZE/BANDAGES/DRESSINGS) IMPLANT
SUPPORT SCROTAL LG STRP (MISCELLANEOUS) ×2 IMPLANT
SUPPORTER ATHLETIC LG (MISCELLANEOUS) ×1
SUT MNCRL AB 4-0 PS2 18 (SUTURE) ×3 IMPLANT
SUT SILK 0 (SUTURE)
SUT SILK 0 30XBRD TIE 6 (SUTURE) IMPLANT
SUT SILK 2 0 (SUTURE) ×3
SUT SILK 2 0 SH CR/8 (SUTURE) ×3 IMPLANT
SUT SILK 2-0 18XBRD TIE 12 (SUTURE) ×1 IMPLANT
SUT VIC AB 2-0 SH 27 (SUTURE)
SUT VIC AB 2-0 SH 27X BRD (SUTURE) IMPLANT
SUT VIC AB 3-0 SH 27 (SUTURE) ×6
SUT VIC AB 3-0 SH 27XBRD (SUTURE) ×2 IMPLANT
SYR CONTROL 10ML LL (SYRINGE) ×3 IMPLANT
TOWEL OR 17X26 10 PK STRL BLUE (TOWEL DISPOSABLE) ×3 IMPLANT

## 2019-09-03 NOTE — Anesthesia Postprocedure Evaluation (Signed)
Anesthesia Post Note  Patient: Patrick Bautista  Procedure(s) Performed: LEFT ORCHIECTOMY (Left )     Patient location during evaluation: PACU Anesthesia Type: General Level of consciousness: awake and alert Pain management: pain level controlled Vital Signs Assessment: post-procedure vital signs reviewed and stable Respiratory status: spontaneous breathing, nonlabored ventilation, respiratory function stable and patient connected to nasal cannula oxygen Cardiovascular status: blood pressure returned to baseline and stable Postop Assessment: no apparent nausea or vomiting Anesthetic complications: no    Last Vitals:  Vitals:   09/03/19 1020 09/03/19 1120  BP: 113/74 116/76  Pulse: 88 93  Resp: 16 15  Temp: (!) 36.4 C   SpO2: 96% 95%    Last Pain:  Vitals:   09/03/19 1120  TempSrc:   PainSc: 2                  Tiajuana Amass

## 2019-09-03 NOTE — Discharge Instructions (Signed)
Orchiectomy: POST OP INSTRUCTIONS  1. DIET: Follow a light bland diet the first 24 hours after arrival home, such as soup, liquids, crackers, etc.  Be sure to include lots of fluids daily.  Avoid fast food or heavy meals as your are more likely to get nauseated.  Eat a low fat the next few days after surgery. 2. Take your usually prescribed home medications unless otherwise directed. 3. PAIN CONTROL: a. Pain is best controlled by a usual combination of three different methods TOGETHER: i. Ice/Heat ii. Over the counter pain medication iii. Prescription pain medication b. Most patients will experience some swelling and bruising around the incision.  Ice packs or heating pads (30-60 minutes up to 6 times a day) will help. Use ice for the first few days to help decrease swelling and bruising, then switch to heat to help relax tight/sore spots and speed recovery.  Some people prefer to use ice alone, heat alone, alternating between ice & heat.  Experiment to what works for you.  Swelling and bruising can take several Shadd[place] to resolve.   c. It is helpful to take an over-the-counter pain medication regularly for the first few [place].  Choose one of the following that works best for you: i. Naproxen (Aleve, etc)  Two 220mg  tabs twice a day ii. Ibuprofen (Advil, etc) Three 200mg  tabs four times a day (every meal & bedtime) iii. Acetaminophen (Tylenol, etc) 325-650mg  four times a day (every meal & bedtime) d. A  prescription for pain medication should be given to you upon discharge.  Take your pain medication as prescribed.  i. If you are having problems/concerns with the prescription medicine (does not control pain, nausea, vomiting, rash, itching, etc), please call us [phone] to see if we need to switch you to a different pain medicine that will work better for you and/or control your side effect better. 4. If you need a refill on your pain medication, please contactus. 5. Avoid getting constipated.   Between the surgery and the pain medications, it is common to experience some constipation.  Increasing fluid intake and taking a fiber supplement (such as Metamucil, Citrucel, FiberCon, MiraLax, etc) 1-2 times a day regularly will usually help prevent this problem from occurring.  A mild laxative (prune juice, Milk of Magnesia, MiraLax, etc) should be taken according to package directions if there are no bowel movements after 48 hours.   6. Wash / shower every day.  You may shower over the dressings as they are waterproof.   7. Remove your waterproof bandages 5 days after surgery.  You may leave the incision open to air.  You may replace a dressing/Band-Aid to cover the incision for comfort if you wish.  Continue to shower over incision(s) after the dressing is off. 8. ACTIVITIES as tolerated:   a. You may resume regular (light) daily activities beginning the next day--such as daily self-care, walking, climbing stairs--gradually increasing activities as tolerated.  If you can walk 30 minutes without difficulty, it is safe to try more intense activity such as jogging, treadmill, bicycling, low-impact aerobics, swimming, etc. b. Save the most intensive and strenuous activity for last such as sit-ups, heavy lifting, contact sports, etc  Refrain from any heavy lifting or straining until you are off narcotics for pain control.   c. DO NOT PUSH THROUGH PAIN.  Let pain be your guide: If it hurts to do something, don't do it.  Pain is your body warning you to avoid that activity for another  week until the pain goes down. d. You may drive when you are no longer taking prescription pain medication, you can comfortably wear a seatbelt, and you can safely maneuver your car and apply brakes. e. Dennis Bast may have sexual intercourse when it is comfortable.  9. FOLLOW UP in our office a. Please call [facility] at [phone] to set up an appointment to see your surgeon in the office for a follow-up appointment  approximately 2-3 Spires after your surgery, if you have not been already scheduled for follow-up. b. Make sure that you call for this appointment the day you arrive home to insure a convenient appointment time. 9.  IF YOU HAVE DISABILITY OR FAMILY LEAVE FORMS, BRING THEM TO THE OFFICE FOR PROCESSING.  DO NOT GIVE THEM TO YOUR DOCTOR.  WHEN TO CALL us [phone]: 1. Poor pain control 2. Reactions / problems with new medications (rash/itching, nausea, etc)  3. Fever over 101.5 F (38.5 C) 4. Inability to urinate 5. Nausea and/or vomiting 6. Worsening swelling or bruising 7. Continued bleeding from incision. 8. Increased pain, redness, or drainage from the incision   The clinic staff is available to answer your questions during regular business hours (8:30am-5pm).  Please dont hesitate to call and ask to speak to one of our nurses for clinical concerns.   If you have a medical emergency, go to the nearest emergency room or call 911.

## 2019-09-03 NOTE — Interval H&P Note (Signed)
History and Physical Interval Note:  09/03/2019 8:22 AM  Patrick Bautista  has presented today for surgery, with the diagnosis of LEFT TESTICULAR TUMOR.  The various methods of treatment have been discussed with the patient and family. After consideration of risks, benefits and other options for treatment, the patient has consented to  Procedure(s): LEFT ORCHIECTOMY (Left) as a surgical intervention.  The patient's history has been reviewed, patient examined, no change in status, stable for surgery.  I have reviewed the patient's chart and labs.  Questions were answered to the patient's satisfaction.     Ardis Hughs

## 2019-09-03 NOTE — Anesthesia Procedure Notes (Signed)
Procedure Name: LMA Insertion Date/Time: 09/03/2019 8:33 AM Performed by: Claudia Desanctis, CRNA Pre-anesthesia Checklist: Emergency Drugs available, Patient identified, Suction available and Patient being monitored Patient Re-evaluated:Patient Re-evaluated prior to induction Oxygen Delivery Method: Circle system utilized Preoxygenation: Pre-oxygenation with 100% oxygen Induction Type: IV induction Ventilation: Mask ventilation without difficulty LMA: LMA inserted LMA Size: 4.0 Number of attempts: 1 Placement Confirmation: positive ETCO2 and breath sounds checked- equal and bilateral Tube secured with: Tape Dental Injury: Teeth and Oropharynx as per pre-operative assessment

## 2019-09-03 NOTE — Transfer of Care (Signed)
Immediate Anesthesia Transfer of Care Note  Patient: Patrick Bautista  Procedure(s) Performed: LEFT ORCHIECTOMY (Left )  Patient Location: PACU  Anesthesia Type:General  Level of Consciousness: drowsy  Airway & Oxygen Therapy: Patient Spontanous Breathing and Patient connected to face mask  Post-op Assessment: Report given to RN and Post -op Vital signs reviewed and stable  Post vital signs: Reviewed and stable  Last Vitals:  Vitals Value Taken Time  BP 89/54 09/03/19 0947  Temp 36.4 C 09/03/19 0947  Pulse 91 09/03/19 0948  Resp 18 09/03/19 0948  SpO2 100 % 09/03/19 0948  Vitals shown include unvalidated device data.  Last Pain:  Vitals:   09/03/19 0653  TempSrc: Oral  PainSc: 0-No pain         Complications: No apparent anesthesia complications

## 2019-09-03 NOTE — Op Note (Signed)
Preoperative diagnosis:  1. left testicular mass   Postoperative diagnosis:  1. same   Procedure: 1. left radical orchiectomy  Surgeon: Ardis Hughs, MD Assistant: none  Anesthesia: General  Complications: None  Intraoperative findings: firm, enlarged, irregular left testicle  EBL: Minimal  Specimens:  Left testicle and spermatic cord.  Indication:  [name] is a 69 y.o.   patient with palpably abnormal left testicle.  Ultrasound of that testicle suggested a tumor concerning for malignancy.  After reviewing the management options for treatment, he elected to proceed with the above surgical procedure(s). We have discussed the potential benefits and risks of the procedure, side effects of the proposed treatment, the likelihood of the patient achieving the goals of the procedure, and any potential problems that might occur during the procedure or recuperation. Informed consent has been obtained.  Description of procedure:  The patient was taken to the operating room and general anesthesia was induced.  The patient was placed in the supine position, prepped and draped in the usual sterile fashion, and preoperative antibiotics were administered. A preoperative time-out was performed.   A 3 cm incision was made over the left inguinal region dissected down to the external oblique fascia. This was then opened and the ilioinguinal nerve spared. The spermatic cord was then encircled with blunt dissection a Penrose drain was passed around the spermatic cord and used as a tourniquet. Dissection of the spermatic cord distally down into the scrotum then ensued using combination of blunt dissection and electrocautery. The testicle was then passed up through the scrotum and into the field. The gubernaculum was then taken with electrocautery. Once the testicle was free from the scrotum I dissected more proximally to the internal inguinal ring. This point I suture ligated the artery  and the vas deferens separately using silk suture. The specimen was then removed and sent to pathology as left testicle and spermatic cord.  Hemostasis was then meticulously achieved. The external oblique aponeurosis was then reapproximated with 3-0 Vicryl in a running fashion. The wound was then irrigated and hemostasis again ensured. Scarpa's fascia was then closed with a 3-0 Vicryl. The skin was closed with a 4-0 Monocryl.  The patient tolerated the procedure well. At the end of the case all laps and needles and sponges were accounted for. The patient was transferred to the PACU in stable condition.

## 2019-09-03 NOTE — H&P (Signed)
07/31/2019: Presents today for concerns about swelling on the left testicle. This occurred approximately 1 month ago while caring too large buckets of water. Not associated with any significant pain, some initial discomfort felt in the groin that continues. He does state the swelling has been cumbersome, getting in the way of his normal daily activities. He tells me over the past month swelling has been gradually improving. Not associated with any changes in baseline voiding symptoms. He has had no burning or painful urination, visible blood in the urine. Denies any constipation, nausea/vomiting. Not associated with fevers or chills.   08/25/2019: At time of last office visit I suspected either a resolving orchitis or communicating hydrocele. Activity limitations as well as supportive measures were discussed in detail. He returns today for follow-up exam with scrotal ultrasound. Over the past month patient still complaining of intermittent swelling with some restriction of activity only while active, not associated with any pain or discomfort or changes in baseline voiding symptoms. When at rest, he noted swelling would resolve and not be present. He still complains of some induration surrounding the left testicle that has failed to resolve. Voiding at his baseline without bothersome frequency/urgency, burning or painful urination, visible blood in the urine. Not associated with any constipation, nausea/vomiting, fevers or chills, unexplained weight loss, unexplained bone pain, unexplained fatigue.   Interval: The patient presents today for follow-up of the ultrasound. The ultrasound demonstrated a completely placed left testicle with tumor or amorphous material. He had a CT scan prior.     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 He is still able to have penetrating intercourse but would like to pursue treatment if possible. He is not on nitrate therapy. - 07/31/2019 Hydrocele - 07/31/2019 Ureteral calculus - 2018, - 2018, - 2017, Right, - 2017 Hydronephrosis Unspec, Right - 2017 Renal calculus    NON-GU PMH: Diverticulitis of large intestine without perforation or abscess without bleeding Hypertension Type 1 diabetes mellitus without complications    FAMILY HISTORY: 1 son - Other Alzheimer's Disease - Father Kidney Stones - Father Strokes - Father   SOCIAL HISTORY: Marital Status: Divorced Preferred Language: English; Race: White Current Smoking Status: Patient has never smoked.  Has never drank.  Does not drink caffeine.    REVIEW OF SYSTEMS:    GU Review Male:   Patient denies frequent urination, hard to postpone urination, burning/ pain with urination, get up at night to urinate, leakage of urine, stream starts and stops, trouble starting your stream, have to strain to urinate , erection problems, and penile pain.  Gastrointestinal (Upper):   Patient denies nausea, vomiting, and indigestion/ heartburn.  Gastrointestinal (Lower):   Patient denies diarrhea and constipation.  Constitutional:   Patient denies fever, night sweats, weight loss, and fatigue.  Skin:   Patient denies skin rash/ lesion and itching.  Eyes:   Patient denies blurred vision and double vision.  Ears/ Nose/ Throat:   Patient denies sore throat and sinus problems.  Hematologic/Lymphatic:   Patient denies swollen glands and easy bruising.  Cardiovascular:   Patient denies leg swelling and chest pains.  Respiratory:   Patient denies shortness of breath and cough.  Endocrine:   Patient denies excessive thirst.  Musculoskeletal:    Patient denies back pain and joint pain.  Neurological:   Patient denies headaches and dizziness.  Psychologic:   Patient denies depression and anxiety.   VITAL SIGNS: None   MULTI-SYSTEM PHYSICAL EXAMINATION:    Constitutional: Well-nourished. No physical deformities. Normally developed. Good grooming.  Respiratory: Normal breath sounds. No labored breathing, no use of accessory muscles.   Cardiovascular: Regular rate and rhythm. No murmur, no gallop. Normal temperature, normal extremity pulses, no swelling, no varicosities.   Gastrointestinal: No mass, no tenderness, no rigidity, non obese abdomen.      Complexity of Data:  Records Review:   Previous Doctor Records, Previous Patient Records, POC Tool  X-Ray Review: C.T. Abdomen/Pelvis: Reviewed Films. Discussed With Patient.     PROCEDURES:         C.T. Chest-Abd-Pelvis w & w/o - 71260, [id number], [id number]      omnipaque 300 125cc Patient confirmed No Neulasta OnPro Device.           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 We discussed the potential etiologies with the most concerning differential including testicular cancer or lymphoma. This CT scan does show some periaortic lymph nodes, the largest 1 only measuring 11 mm. I only counted 3. The formal read is still pending. We discussed treatment options,  and I recommended that we start with an orchiectomy. I went through the surgery with the patient in detail. Will plan to get this scheduled as soon as we can to facilitate treatment. He already had tumor markers drawn. Will follow up with these.   In addition, the patient I discussed his erectile dysfunction. I reviewed the proper use and potential side effects of sildenafil. We also discussed that generic sildenafil 20 mg for treatment of erectile dysfunction is considered "off label" use of this medication. All questions were answered to the patient's satisfaction and he was provided medication today, #90 tablets of20mg  sildenafil.

## 2019-09-05 ENCOUNTER — Other Ambulatory Visit: Payer: Self-pay

## 2019-09-08 LAB — SURGICAL PATHOLOGY

## 2019-09-10 ENCOUNTER — Telehealth: Payer: Self-pay | Admitting: Hematology

## 2019-09-10 NOTE — Telephone Encounter (Signed)
Received a new pt referral from Dr. Louis Meckel at [facility] for [facility]. Mr. Recio has been cld and scheduled to see Dr. Irene Limbo on 5/25 at 2:40pm. Pt aware to arrive 20 minutes early.

## 2019-09-16 ENCOUNTER — Inpatient Hospital Stay: Payer: Medicare Other

## 2019-09-16 ENCOUNTER — Inpatient Hospital Stay: Payer: Medicare Other | Attending: Hematology | Admitting: Hematology

## 2019-09-16 ENCOUNTER — Other Ambulatory Visit: Payer: Self-pay

## 2019-09-16 VITALS — BP 117/58 | HR 93 | Temp 97.7°F | Resp 20 | Ht 70.0 in | Wt 223.0 lb

## 2019-09-16 DIAGNOSIS — I129 Hypertensive chronic kidney disease with stage 1 through stage 4 chronic kidney disease, or unspecified chronic kidney disease: Secondary | ICD-10-CM | POA: Insufficient documentation

## 2019-09-16 DIAGNOSIS — Z79899 Other long term (current) drug therapy: Secondary | ICD-10-CM | POA: Insufficient documentation

## 2019-09-16 DIAGNOSIS — F419 Anxiety disorder, unspecified: Secondary | ICD-10-CM | POA: Insufficient documentation

## 2019-09-16 DIAGNOSIS — Z87442 Personal history of urinary calculi: Secondary | ICD-10-CM | POA: Diagnosis not present

## 2019-09-16 DIAGNOSIS — E1122 Type 2 diabetes mellitus with diabetic chronic kidney disease: Secondary | ICD-10-CM | POA: Diagnosis not present

## 2019-09-16 DIAGNOSIS — F439 Reaction to severe stress, unspecified: Secondary | ICD-10-CM | POA: Insufficient documentation

## 2019-09-16 DIAGNOSIS — C8336 Diffuse large B-cell lymphoma, intrapelvic lymph nodes: Secondary | ICD-10-CM | POA: Diagnosis present

## 2019-09-16 DIAGNOSIS — C8339 Diffuse large B-cell lymphoma, extranodal and solid organ sites: Secondary | ICD-10-CM

## 2019-09-16 DIAGNOSIS — Z9079 Acquired absence of other genital organ(s): Secondary | ICD-10-CM | POA: Insufficient documentation

## 2019-09-16 DIAGNOSIS — N183 Chronic kidney disease, stage 3 unspecified: Secondary | ICD-10-CM | POA: Insufficient documentation

## 2019-09-16 DIAGNOSIS — M1990 Unspecified osteoarthritis, unspecified site: Secondary | ICD-10-CM | POA: Diagnosis not present

## 2019-09-16 DIAGNOSIS — Z8719 Personal history of other diseases of the digestive system: Secondary | ICD-10-CM | POA: Insufficient documentation

## 2019-09-16 DIAGNOSIS — M5126 Other intervertebral disc displacement, lumbar region: Secondary | ICD-10-CM | POA: Insufficient documentation

## 2019-09-16 LAB — HEPATITIS C ANTIBODY: HCV Ab: NONREACTIVE

## 2019-09-16 LAB — CMP (CANCER CENTER ONLY)
ALT: 38 U/L (ref 0–44)
AST: 20 U/L (ref 15–41)
Albumin: 4.2 g/dL (ref 3.5–5.0)
Alkaline Phosphatase: 105 U/L (ref 38–126)
Anion gap: 10 (ref 5–15)
BUN: 35 mg/dL — ABNORMAL HIGH (ref 8–23)
CO2: 24 mmol/L (ref 22–32)
Calcium: 10 mg/dL (ref 8.9–10.3)
Chloride: 106 mmol/L (ref 98–111)
Creatinine: 1.87 mg/dL — ABNORMAL HIGH (ref 0.61–1.24)
GFR, Est AFR Am: 41 mL/min — ABNORMAL LOW (ref >60)
GFR, Estimated: 36 mL/min — ABNORMAL LOW (ref >60)
Glucose, Bld: 177 mg/dL — ABNORMAL HIGH (ref 70–99)
Potassium: 4.5 mmol/L (ref 3.5–5.1)
Sodium: 140 mmol/L (ref 135–145)
Total Bilirubin: <0.2 mg/dL — ABNORMAL LOW (ref 0.3–1.2)
Total Protein: 7.9 g/dL (ref 6.5–8.1)

## 2019-09-16 LAB — TYPE AND SCREEN
ABO/RH(D): O POS
Antibody Screen: NEGATIVE

## 2019-09-16 LAB — HIV ANTIBODY (ROUTINE TESTING W REFLEX): HIV Screen 4th Generation wRfx: NONREACTIVE

## 2019-09-16 LAB — HEPATITIS B SURFACE ANTIGEN: Hepatitis B Surface Ag: NONREACTIVE

## 2019-09-16 LAB — HEPATITIS B CORE ANTIBODY, TOTAL: Hep B Core Total Ab: NONREACTIVE

## 2019-09-16 LAB — VITAMIN D 25 HYDROXY (VIT D DEFICIENCY, FRACTURES): Vit D, 25-Hydroxy: 11.73 ng/mL — ABNORMAL LOW (ref 30–100)

## 2019-09-16 MED ORDER — CITALOPRAM HYDROBROMIDE 10 MG PO TABS: 10.0000 mg | ORAL_TABLET | Freq: Every day | ORAL | 2 refills | Status: DC

## 2019-09-16 NOTE — Patient Instructions (Signed)
Thank you for choosing [facility] to provide your oncology and hematology care.   Should you have questions after your visit to the [facility] ([facility]), please contact this office at [phone] between 8:30 AM and 4:30 PM.  Voice mails left after 4:00 PM may not be returned until the following business day.  Calls received after 4:30 PM will be answered by an off-site Nurse Triage Line.    Prescription Refills:  Please have your pharmacy contact us directly for most prescription requests.  Contact the office directly for refills of narcotics (pain medications). Allow 48-72 hours for refills.  Appointments: Please contact the [facility] scheduling department [phone] for questions regarding [facility] appointment scheduling.  Contact the schedulers with any scheduling changes so that your appointment can be rescheduled in a timely manner.   Central Scheduling for [place] [phone] - Call to schedule procedures such as PET scans, CT scans, MRI, Ultrasound, etc.  To afford each patient quality time with our providers, please arrive 30 minutes before your scheduled appointment time.  If you arrive late for your appointment, you may be asked to reschedule.  We strive to give you quality time with our providers, and arriving late affects you and other patients whose appointments are after yours. If you are a no show for multiple scheduled visits, you may be dismissed from the clinic at the providers discretion.     Resources: [facility] Social Workers [phone] for additional information on assistance programs or assistance connecting with community support programs   [place] DSS  336-641-3447: Information regarding food stamps, Medicaid, and utility assistance [place] 336-333-6589   [place] [employer]'s shared-ride transportation service for eligible riders who have a disability that prevents them from riding the fixed route bus.   Medicare Rights Center  800-333-4114 Helps people with Medicare understand their rights and benefits, navigate the Medicare system, and secure the quality healthcare they deserve [employer] 800-227-2345 Assists patients locate various types of support and financial assistance Cancer Care: 1-800-813-HOPE (4673) Provides financial assistance, online support groups, medication/co-pay assistance.   Transportation Assistance for appointments at [facility]: Transportation Coordinator [phone]  Again, thank you for choosing [facility] for your care.       

## 2019-09-16 NOTE — Progress Notes (Signed)
HEMATOLOGY/ONCOLOGY CONSULTATION NOTE  Date of Service: 09/19/2019  Patient Care Team: [name], L.[name], MD as PCP - General (Family Medicine)  CHIEF COMPLAINTS/PURPOSE OF CONSULTATION:  DLBCL  HISTORY OF PRESENTING ILLNESS:   Patrick Bautista is a wonderful 69 y.o. male who has been referred to [place] by Dr. 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 Pt is accompanied today by his girlfriend, Patrick Bautista. The pt reports that he is doing well overall.    Pt received his second COVID19 vaccine on 02/13. Soon after he began having sweating in his groin, numbness in his left thigh, and numbness in two fingers on his left hand. After the symptoms began pt went to see his PCP who thought he had a hydrocele. He returned a month later, with continued symptoms, and received a testicular US which found a mass. Pt had a left Orchiectomy performed on 09/03/2019. The pt reports that he saw Dr. Louis Meckel this morning and has been healing well from his surgery.   His Diabetes has been relatively well-controlled with diet and Glimepiride. It has been many years since he has had concerns with Diverticulitis and last had kidney stones in 2017. He has previously been seen by a Nephrologist who felt that his kidney disease could have been partially caused by Cystoscopy with Dr. Louis Meckel in 2018. Pt has no history of lung or heart dysfunction or neuropathy.   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   Of note prior to the patient's visit today, pt has had Left Testicle and Spermatic Cord Surgical Pathology ([id number]) completed on 09/03/2019 with results revealing "Diffuse large B-cell lymphoma."   Pt has had CT C/A/P completed on 08/26/2019 with results revealing "1. Large left-sided testicular mass consistent with known testicular cancer. 2. No findings for metastatic disease involving the chest, abdomen, or pelvis. 3. A few small scattered  retroperitoneal lymph nodes but no mass or overt adenopathy. "  Most recent lab results (09/01/2019) of CBC w/diff and CMP is as follows: all values are WNL except for Hgb at 12.7, Glucose at 177, BUN at 50, Creatinine at 1.91, GFR Est Non Af Am at 41.  On review of systems, pt reports anxiety, stress and denies neuropathy, right testicular pain/swelling, abdominal pain and any other symptoms.   On PMHx the pt reports HTN, Diabetes Type II, Diverticulitis, Arthritis, Lumbar herniated disc, Left Orchiectomy. On Social Hx the pt reports that he is a retired Agricultural consultant.   MEDICAL HISTORY:  Past Medical History:  Diagnosis Date  . Arthritis   . Diabetes mellitus without complication ([place])    type 2  . Diverticulitis 15 yrs ago  . History of kidney stones    right ureteral stone and one in 2017  . Hypertension   . Lumbar herniated disc    L 4 L 5     SURGICAL HISTORY: Past Surgical History:  Procedure Laterality Date  . colonscopy  15 yrs ago  . CYSTOSCOPY/URETEROSCOPY/HOLMIUM LASER/STENT PLACEMENT Right 02/16/2017   Procedure: CYSTOSCOPY/RETROGRADE/URETEROSCOPY/HOLMIUM LASER/STENT PLACEMENT;  Surgeon: Ardis Hughs, MD;  Location: [facility];  Service: Urology;  Laterality: Right;  NEEDS 60 MIN FOR PROCEDURE  . MOUTH SURGERY  20 yrs ago  . ORCHIECTOMY Left 09/03/2019   Procedure: LEFT ORCHIECTOMY;  Surgeon: Ardis Hughs, MD;  Location: [facility];  Service: Urology;  Laterality: Left;    SOCIAL HISTORY: Social History   Socioeconomic History  . Marital status: Legally Separated  Spouse name: Not on file  . Number of children: Not on file  . Years of education: Not on file  . Highest education level: Not on file  Occupational History  . Not on file  Tobacco Use  . Smoking status: Never Smoker  . Smokeless tobacco: Never Used  Substance and Sexual Activity  . Alcohol use: No  . Drug use: No  . Sexual activity: Not on file  Other Topics Concern  . Not on  file  Social History Narrative  . Not on file   Social Determinants of Health   Financial Resource Strain:   . Difficulty of Paying Living Expenses:   Food Insecurity:   . Worried About Charity fundraiser in the Last Year:   . Arboriculturist in the Last Year:   Transportation Needs:   . Film/video editor (Medical):   Marland Kitchen Lack of Transportation (Non-Medical):   Physical Activity:   . Days of Exercise per Week:   . Minutes of Exercise per Session:   Stress:   . Feeling of Stress :   Social Connections:   . Frequency of Communication with Friends and Family:   . Frequency of Social Gatherings with Friends and Family:   . Attends Religious Services:   . Active Member of Clubs or Organizations:   . Attends Archivist Meetings:   Marland Kitchen Marital Status:   Intimate Partner Violence:   . Fear of Current or Ex-Partner:   . Emotionally Abused:   Marland Kitchen Physically Abused:   . Sexually Abused:     FAMILY HISTORY: No family history on file.  ALLERGIES:  is allergic to hydrocodone; percocet [oxycodone-acetaminophen]; codeine; penicillins; and tape.  MEDICATIONS:  Current Outpatient Medications  Medication Sig Dispense Refill  . acetaminophen (TYLENOL) 500 MG tablet Take 500-1,000 mg by mouth every 6 (six) hours as needed (pain.).     Marland Kitchen ALPRAZolam (XANAX) 0.25 MG tablet Take 0.25 mg by mouth at bedtime as needed for sleep.    Marland Kitchen amLODipine (NORVASC) 2.5 MG tablet Take 2.5 mg by mouth daily. 2.5 mg + 5 mg =7.5 mg total daily dose    . amLODipine (NORVASC) 5 MG tablet Take 5 mg by mouth daily. 2.5 mg + 5 mg =7.5 mg total daily dose  11  . fluocinonide cream (LIDEX) [id number] % Apply 1 application topically 2 (two) times daily as needed (skin irritation.).    Marland Kitchen glimepiride (AMARYL) 1 MG tablet Take 1 mg by mouth daily.  12  . olmesartan-hydrochlorothiazide (BENICAR HCT) 40-25 MG tablet Take 1 tablet by mouth daily.    . citalopram (CELEXA) 10 MG tablet Take 1 tablet (10 mg total) by mouth  daily. 30 tablet 2  . traMADol (ULTRAM) 50 MG tablet Take 1-2 tablets (50-100 mg total) by mouth every 6 (six) hours as needed for moderate pain. (Patient not taking: Reported on 09/16/2019) 15 tablet 0   No current facility-administered medications for this visit.   Facility-Administered Medications Ordered in Other Visits  Medication Dose Route Frequency Provider Last Rate Last Admin  . 0.9 %  sodium chloride infusion   Intravenous Continuous [place], Jamie R, NP      . vancomycin (VANCOREADY) IVPB 1500 mg/300 mL  1,500 mg Intravenous to XRAY [place], Ardeth Perfect, NP        REVIEW OF SYSTEMS:    10 Point review of Systems was done is negative except as noted above.  PHYSICAL EXAMINATION: ECOG PERFORMANCE STATUS: 0 -  Asymptomatic  . Vitals:   09/16/19 1440  BP: (!) 117/58  Pulse: 93  Resp: 20  Temp: 97.7 F (36.5 C)  SpO2: 99%   Filed Weights   09/16/19 1440  Weight: 223 lb (101.2 kg)   .Body mass index is 32 kg/m.  GENERAL:alert, in no acute distress and comfortable SKIN: no acute rashes, no significant lesions EYES: conjunctiva are pink and non-injected, sclera anicteric OROPHARYNX: MMM, no exudates, no oropharyngeal erythema or ulceration NECK: supple, no JVD LYMPH:  no palpable lymphadenopathy in the cervical, axillary or inguinal regions LUNGS: clear to auscultation b/l with normal respiratory effort HEART: regular rate & rhythm ABDOMEN:  normoactive bowel sounds , non tender, not distended. Extremity: no pedal edema PSYCH: alert & oriented x 3 with fluent speech NEURO: no focal motor/sensory deficits  LABORATORY DATA:  I have reviewed the data as listed  . CBC Latest Ref Rng & Units 09/16/2019 09/01/2019 02/15/2017  WBC 4.0 - 10.5 K/uL 9.5 7.5 12.1(H)  Hemoglobin 13.0 - 17.0 g/dL 12.0(L) 12.7(L) 12.2(L)  Hematocrit 39.0 - 52.0 % 36.5(L) 39.3 36.7(L)  Platelets 150 - 400 K/uL 255 258 426(H)    . CMP Latest Ref Rng & Units 09/16/2019 09/01/2019 02/15/2017    Glucose 70 - 99 mg/dL 177(H) 177(H) 181(H)  BUN 8 - 23 mg/dL 35(H) 50(H) 31(H)  Creatinine 0.61 - 1.24 mg/dL 1.87(H) 1.91(H) 2.14(H)  Sodium 135 - 145 mmol/L 140 139 137  Potassium 3.5 - 5.1 mmol/L 4.5 5.1 4.4  Chloride 98 - 111 mmol/L 106 107 100(L)  CO2 22 - 32 mmol/L 24 23 25   Calcium 8.9 - 10.3 mg/dL 10.0 10.2 9.9  Total Protein 6.5 - 8.1 g/dL 7.9 - -  Total Bilirubin 0.3 - 1.2 mg/dL <0.2(L) - -  Alkaline Phos 38 - 126 U/L 105 - -  AST 15 - 41 U/L 20 - -  ALT 0 - 44 U/L 38 - -   09/03/2019 Left Testicle and Spermatic Cord Surgical Pathology ([id number]) :    RADIOGRAPHIC STUDIES: I have personally reviewed the radiological images as listed and agreed with the findings in the report. No results found.  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 Arthritis   . Diabetes mellitus without complication ([place])    type 2  . Diverticulitis 15 yrs ago  . History of kidney stones    right ureteral stone and one in 2017  . Hypertension   . Lumbar herniated disc    L 4 L 5   3) CKD stage 3 PLAN: -Discussed patient's most recent labs from 09/01/2019,  all values are WNL except for Hgb at 12.7, Glucose at 177, BUN at 50, Creatinine at 1.91, GFR Est Non Af Am at 41. -Discussed Left Testicle and Spermatic Cord Surgical Pathology ([id number]) completed on 09/03/2019 with results revealing "Diffuse large B-cell lymphoma."  -Discussed 08/26/2019 CT C/A/P which revealed "1. Large left-sided testicular mass consistent with known testicular cancer. 2. No findings for metastatic disease involving the chest, abdomen, or pelvis. 3. A few small scattered retroperitoneal lymph nodes but no mass or overt adenopathy. " -Advised pt that he has a primary DLBCL of the testicle. Based on CT, not much other involvement from other organs but may be present elsewhere microscopically with high risk  of disease progression without systemic rx. -Advised pt that we will treat to cure  -  Advised pt that we would need a PET/CT scan to complete staging -Advised pt that standard regimen for DLBCL would be R-CHOP. Usually once every 3 Ou, for up to 6 cycles with G-CSF support. -Advised pt that treatment will also include Intrathecal Methotrexate for CNS prophylaxis due to the increased risk of CNS relapse. -Advised pt that we plan to complete RT to his right testicle after the completion of chemotherapy -Recommend pt take Ativan prn for sleeplessness and Celexa daily for anxiety -Recommended that the pt continue to eat well, drink at least 48-64 oz of water each day, and walk 20-30 minutes each day.  -Recommend pt follow closely with Dr. Chalmers Cater for Diabetes management during treatment -Will set up chemo counseling  -Will get Port-a-cath placement -Will get PET/CT and ECHO  -Will get labs today  -Rx Celexa, Ativan   FOLLOW UP: Labs today PET/CT ASAP ECHO ASAP Port a cath placement ASAP Will plan to start CTX in about 2 [place], chemo-counseling and MD visit   . Orders Placed This Encounter  Procedures  . IR IMAGING GUIDED PORT INSERTION    Standing Status:   Future    Number of Occurrences:   1    Standing Expiration Date:   09/15/2020    Order Specific Question:   Reason for Exam (SYMPTOM  OR DIAGNOSIS REQUIRED)    Answer:   Port a cath placement urgent for chemotherapy for primary testicular large B cell lymphoma    Order Specific Question:   Preferred Imaging Location?    Answer:   [facility]    Order Specific Question:   Release to patient    Answer:   Immediate  . NM PET Image Initial (PI) Skull Base To Thigh    Standing Status:   Future    Standing Expiration Date:   09/15/2020    Order Specific Question:   ** REASON FOR EXAM (FREE TEXT)    Answer:   Initial staging for newly diagnosed primary testicular large b cell lymphoma    Order Specific Question:   If  indicated for the ordered procedure, I authorize the administration of a radiopharmaceutical per Radiology protocol    Answer:   Yes    Order Specific Question:   Preferred imaging location?    Answer:   [facility]    Order Specific Question:   Radiology Contrast Protocol - do NOT remove file path    Answer:   [email or web address]  . CBC with Differential/Platelet    Standing Status:   Future    Number of Occurrences:   1    Standing Expiration Date:   09/15/2020  . CMP ([place] only)    Standing Status:   Future    Number of Occurrences:   1    Standing Expiration Date:   09/15/2020  . Lactate dehydrogenase    Standing Status:   Future    Number of Occurrences:   1    Standing Expiration Date:   09/15/2020  . Hepatitis C antibody    Standing Status:   Future    Number of Occurrences:   1    Standing Expiration Date:   09/15/2020  . Hepatitis B core antibody, total    Standing Status:   Future    Number of Occurrences:   1    Standing Expiration Date:   09/15/2020  . Hepatitis B surface antigen    Standing Status:   Future    Number of Occurrences:  1    Standing Expiration Date:   09/15/2020  . HIV Antibody (routine testing w rflx)    Standing Status:   Future    Number of Occurrences:   1    Standing Expiration Date:   09/15/2020  . Vitamin D 25 hydroxy    Standing Status:   Future    Number of Occurrences:   1    Standing Expiration Date:   09/15/2020  . ECHOCARDIOGRAM COMPLETE    Standing Status:   Future    Number of Occurrences:   1    Standing Expiration Date:   09/15/2020    Scheduling Instructions:     To evaluate cardiac function/EF prior to anthracycline based chemotherapy for newly diagnosed large b cell lymphoma    Order Specific Question:   Where should this test be performed    Answer:   [place] [facility]    Order Specific Question:   Perflutren DEFINITY (image enhancing agent) should be administered unless hypersensitivity or allergy  exist    Answer:   Administer Perflutren    Order Specific Question:   Reason for exam-Echo    Answer:   Chemo  V67.2 / Z09  . Type and screen    Standing Status:   Future    Number of Occurrences:   1    Standing Expiration Date:   09/15/2020     All of the patients questions were answered with apparent satisfaction. The patient knows to call the clinic with any problems, questions or concerns.  I spent 45 mins counseling the patient face to face. The total time spent in the appointment was 65 minutes and more than 50% was on counseling and direct patient cares.    Sullivan Lone MD [place] AAHIVMS [facility] Hematology/Oncology Physician [facility]  (Office):       [phone] (Work cell):  [phone] (Fax):           [phone]  09/19/2019 9:23 AM  I, [name], am acting as a scribe for Dr. Sullivan Lone.   .I have reviewed the above documentation for accuracy and completeness, and I agree with the above. Brunetta Genera MD

## 2019-09-17 ENCOUNTER — Telehealth: Payer: Self-pay | Admitting: *Deleted

## 2019-09-17 LAB — LACTATE DEHYDROGENASE: LDH: 130 U/L (ref 98–192)

## 2019-09-17 LAB — CBC WITH DIFFERENTIAL/PLATELET
Abs Immature Granulocytes: 0.03 10*3/uL (ref 0.00–0.07)
Basophils Absolute: 0 10*3/uL (ref 0.0–0.1)
Basophils Relative: 0 %
Eosinophils Absolute: 0.2 10*3/uL (ref 0.0–0.5)
Eosinophils Relative: 2 %
HCT: 36.5 % — ABNORMAL LOW (ref 39.0–52.0)
Hemoglobin: 12 g/dL — ABNORMAL LOW (ref 13.0–17.0)
Immature Granulocytes: 0 %
Lymphocytes Relative: 27 %
Lymphs Abs: 2.5 10*3/uL (ref 0.7–4.0)
MCH: 29.1 pg (ref 26.0–34.0)
MCHC: 32.9 g/dL (ref 30.0–36.0)
MCV: 88.6 fL (ref 80.0–100.0)
Monocytes Absolute: 0.7 10*3/uL (ref 0.1–1.0)
Monocytes Relative: 8 %
Neutro Abs: 6 10*3/uL (ref 1.7–7.7)
Neutrophils Relative %: 63 %
Platelets: 255 10*3/uL (ref 150–400)
RBC: 4.12 MIL/uL — ABNORMAL LOW (ref 4.22–5.81)
RDW: 13.9 % (ref 11.5–15.5)
WBC: 9.5 10*3/uL (ref 4.0–10.5)
nRBC: 0 % (ref 0.0–0.2)

## 2019-09-17 LAB — ABO/RH: ABO/RH(D): O POS

## 2019-09-17 NOTE — Telephone Encounter (Signed)
Dr.Upton @ [facility] requested Labs from 09/16/19 faxed to [phone]. Contact's name Thelma Comp. [phone]. Labs faxed/Fax confirmation received

## 2019-09-18 ENCOUNTER — Other Ambulatory Visit: Payer: Self-pay | Admitting: Student

## 2019-09-18 ENCOUNTER — Other Ambulatory Visit: Payer: Self-pay

## 2019-09-18 ENCOUNTER — Ambulatory Visit (HOSPITAL_COMMUNITY)
Admission: RE | Admit: 2019-09-18 | Discharge: 2019-09-18 | Disposition: A | Discharge status: Home / Self Care | Payer: Medicare Other | Source: Ambulatory Visit | Attending: Hematology | Admitting: Hematology

## 2019-09-18 DIAGNOSIS — C8339 Diffuse large B-cell lymphoma, extranodal and solid organ sites: Secondary | ICD-10-CM

## 2019-09-18 DIAGNOSIS — Z79899 Other long term (current) drug therapy: Secondary | ICD-10-CM | POA: Diagnosis not present

## 2019-09-18 DIAGNOSIS — E119 Type 2 diabetes mellitus without complications: Secondary | ICD-10-CM | POA: Insufficient documentation

## 2019-09-18 DIAGNOSIS — C83398 Diffuse large b-cell lymphoma of other extranodal and solid organ sites: Secondary | ICD-10-CM

## 2019-09-18 DIAGNOSIS — Z7901 Long term (current) use of anticoagulants: Secondary | ICD-10-CM | POA: Diagnosis not present

## 2019-09-18 DIAGNOSIS — I519 Heart disease, unspecified: Secondary | ICD-10-CM

## 2019-09-18 DIAGNOSIS — I10 Essential (primary) hypertension: Secondary | ICD-10-CM | POA: Insufficient documentation

## 2019-09-18 NOTE — Progress Notes (Signed)
Echocardiogram 2D Echocardiogram has been performed.  Oneal Deputy Consuelo Suthers 09/18/2019, 1:58 PM

## 2019-09-19 ENCOUNTER — Encounter (HOSPITAL_COMMUNITY): Payer: Self-pay

## 2019-09-19 ENCOUNTER — Ambulatory Visit (HOSPITAL_COMMUNITY)
Admission: RE | Admit: 2019-09-19 | Discharge: 2019-09-19 | Disposition: A | Discharge status: Home / Self Care | Payer: Medicare Other | Source: Ambulatory Visit | Attending: Hematology | Admitting: Hematology

## 2019-09-19 ENCOUNTER — Other Ambulatory Visit: Payer: Self-pay

## 2019-09-19 DIAGNOSIS — C8339 Diffuse large B-cell lymphoma, extranodal and solid organ sites: Secondary | ICD-10-CM | POA: Diagnosis not present

## 2019-09-19 HISTORY — PX: IR IMAGING GUIDED PORT INSERTION: IMG5740

## 2019-09-19 LAB — GLUCOSE, CAPILLARY: Glucose-Capillary: 179 mg/dL — ABNORMAL HIGH (ref 70–99)

## 2019-09-19 MED ORDER — HEPARIN SOD (PORK) LOCK FLUSH 100 UNIT/ML IV SOLN
INTRAVENOUS | Status: AC
  Filled 2019-09-19: qty 5

## 2019-09-19 MED ORDER — MIDAZOLAM HCL 2 MG/2ML IJ SOLN
INTRAMUSCULAR | Status: AC
  Filled 2019-09-19: qty 4

## 2019-09-19 MED ORDER — MIDAZOLAM HCL 2 MG/2ML IJ SOLN
INTRAMUSCULAR | Status: AC | PRN
  Administered 2019-09-19: 1 mg via INTRAVENOUS
  Administered 2019-09-19: 0.5 mg via INTRAVENOUS
  Administered 2019-09-19: 1 mg via INTRAVENOUS
  Administered 2019-09-19: 0.5 mg via INTRAVENOUS

## 2019-09-19 MED ORDER — VANCOMYCIN HCL 1500 MG/300ML IV SOLN
1500.0000 mg | INTRAVENOUS | Status: AC
  Administered 2019-09-19: 1500 mg via INTRAVENOUS
  Filled 2019-09-19 (×2): qty 300

## 2019-09-19 MED ORDER — LIDOCAINE HCL 1 % IJ SOLN
INTRAMUSCULAR | Status: AC
  Filled 2019-09-19: qty 20

## 2019-09-19 MED ORDER — SODIUM CHLORIDE 0.9 % IV SOLN: INTRAVENOUS | Status: DC

## 2019-09-19 MED ORDER — LIDOCAINE HCL (PF) 1 % IJ SOLN
INTRAMUSCULAR | Status: AC | PRN
  Administered 2019-09-19 (×2): 10 mL via INTRADERMAL

## 2019-09-19 MED ORDER — FENTANYL CITRATE (PF) 100 MCG/2ML IJ SOLN
INTRAMUSCULAR | Status: AC | PRN
  Administered 2019-09-19: 50 ug via INTRAVENOUS

## 2019-09-19 MED ORDER — FENTANYL CITRATE (PF) 100 MCG/2ML IJ SOLN
INTRAMUSCULAR | Status: AC
  Filled 2019-09-19: qty 2

## 2019-09-19 MED ORDER — HEPARIN SOD (PORK) LOCK FLUSH 100 UNIT/ML IV SOLN
INTRAVENOUS | Status: AC | PRN
  Administered 2019-09-19: 500 [IU] via INTRAVENOUS

## 2019-09-19 NOTE — H&P (Signed)
Chief Complaint: Patient was seen in consultation today for port-a-cath placement.  Referring Physician(s): Brunetta Genera  Supervising Physician: Corrie Mckusick  Patient Status: [facility] - Out-pt  History of Present Illness: Patrick Bautista is a 69 y.o. male with a medical history of DM, kidney stones, diverticulitis and testicular cancer. In March of 2021 the patient developed left testicular swelling. A scrotal ultrasound was performed May 2021 that demonstrated a tumor or amorphous material. A left radical orchiectomy was done 09/03/2019. Surgical pathology revealed diffuse large B-cell lymphoma.   Interventional Radiology has been asked to evaluate this patient for the placement of a port-a-cath to facilitate his treatment plans.   Past Medical History:  Diagnosis Date  . Arthritis   . Diabetes mellitus without complication ([place])    type 2  . Diverticulitis 15 yrs ago  . History of kidney stones    right ureteral stone and one in 2017  . Hypertension   . 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 colonscopy  15 yrs ago  . CYSTOSCOPY/URETEROSCOPY/HOLMIUM LASER/STENT PLACEMENT Right 02/16/2017   Procedure: CYSTOSCOPY/RETROGRADE/URETEROSCOPY/HOLMIUM LASER/STENT PLACEMENT;  Surgeon: Ardis Hughs, MD;  Location: [facility];  Service: Urology;  Laterality: Right;  NEEDS 60 MIN FOR PROCEDURE  . MOUTH SURGERY  20 yrs ago  . ORCHIECTOMY Left 09/03/2019   Procedure: LEFT ORCHIECTOMY;  Surgeon: Ardis Hughs, MD;  Location: [facility];  Service: Urology;  Laterality: Left;    Allergies: Hydrocodone, Percocet [oxycodone-acetaminophen], Codeine, Penicillins, and Tape  Medications: Prior to Admission medications   Medication Sig Start Date End Date Taking? Authorizing Provider  acetaminophen (TYLENOL) 500 MG tablet Take 500-1,000 mg by mouth every 6 (six) hours as needed (pain.).     [provider]  ALPRAZolam Duanne Moron)  0.25 MG tablet Take 0.25 mg by mouth at bedtime as needed for sleep.    [provider]  amLODipine (NORVASC) 2.5 MG tablet Take 2.5 mg by mouth daily. 2.5 mg + 5 mg =7.5 mg total daily dose    [provider]  amLODipine (NORVASC) 5 MG tablet Take 5 mg by mouth daily. 2.5 mg + 5 mg =7.5 mg total daily dose 10/29/15   [provider]  citalopram (CELEXA) 10 MG tablet Take 1 tablet (10 mg total) by mouth daily. 09/16/19   Brunetta Genera, MD  fluocinonide cream (LIDEX) [id number] % Apply 1 application topically 2 (two) times daily as needed (skin irritation.).    [provider]  glimepiride (AMARYL) 1 MG tablet Take 1 mg by mouth daily. 01/24/17   [provider]  olmesartan-hydrochlorothiazide (BENICAR HCT) 40-25 MG tablet Take 1 tablet by mouth daily. 07/31/19   [provider]  traMADol (ULTRAM) 50 MG tablet Take 1-2 tablets (50-100 mg total) by mouth every 6 (six) hours as needed for moderate pain. Patient not taking: Reported on 09/16/2019 09/03/19   Ardis Hughs, MD     No family history on file.  Social History   Socioeconomic History  . Marital status: Legally Separated    Spouse name: Not on file  . Number of children: Not on file  . Years of education: Not on file  . Highest education level: Not on file  Occupational History  . Not on file  Tobacco Use  . Smoking status: Never Smoker  . Smokeless tobacco: Never Used  Substance and Sexual Activity  . Alcohol use: No  .  Drug use: No  . Sexual activity: Not on file  Other Topics Concern  . Not on file  Social History Narrative  . Not on file   Social Determinants of Health   Financial Resource Strain:   . Difficulty of Paying Living Expenses:   Food Insecurity:   . Worried About Charity fundraiser in the Last Year:   . Arboriculturist in the Last Year:   Transportation Needs:   . Film/video editor (Medical):   Marland Kitchen Lack of Transportation (Non-Medical):     Physical Activity:   . Days of Exercise per Week:   . Minutes of Exercise per Session:   Stress:   . Feeling of Stress :   Social Connections:   . Frequency of Communication with Friends and Family:   . Frequency of Social Gatherings with Friends and Family:   . Attends Religious Services:   . Active Member of Clubs or Organizations:   . Attends Archivist Meetings:   Marland Kitchen Marital Status:     Review of Systems: A 12 point ROS discussed and pertinent positives are indicated in the HPI above.  All other systems are negative.  Review of Systems  Constitutional: Negative for activity change, appetite change, fatigue and fever.  Respiratory: Negative for cough and shortness of breath.   Cardiovascular: Negative for chest pain and leg swelling.  Gastrointestinal: Negative for abdominal distention, abdominal pain, diarrhea, nausea and vomiting.  Genitourinary: Positive for testicular pain.       Left-side, from surgery. Mild discomfort.   Musculoskeletal: Negative for back pain.  Neurological: Negative for headaches.    Vital Signs: BP 129/77 (BP Location: Right Arm)   Pulse 77   Temp 98.3 F (36.8 C) (Oral)   Resp 18   SpO2 100%   Physical Exam Constitutional:      General: He is not in acute distress.    Appearance: Normal appearance.  Cardiovascular:     Rate and Rhythm: Normal rate and regular rhythm.     Pulses: Normal pulses.     Heart sounds: Normal heart sounds.  Pulmonary:     Effort: Pulmonary effort is normal.     Breath sounds: Normal breath sounds.  Abdominal:     General: Bowel sounds are normal.     Palpations: Abdomen is soft.  Musculoskeletal:        General: Normal range of motion.  Skin:    General: Skin is warm and dry.  Neurological:     Mental Status: He is alert and oriented to person, place, and time.  Psychiatric:        Mood and Affect: Mood normal.        Behavior: Behavior normal.        Thought Content: Thought content normal.         Judgment: Judgment normal.     Imaging: No results found.  Labs:  CBC: Recent Labs    09/01/19 0946 09/16/19 1623  WBC 7.5 9.5  HGB 12.7* 12.0*  HCT 39.3 36.5*  PLT 258 255    COAGS: No results for input(s): INR, APTT in the last 8760 hours.  BMP: Recent Labs    09/01/19 0946 09/16/19 1623  NA 139 140  K 5.1 4.5  CL 107 106  CO2 23 24  GLUCOSE 177* 177*  BUN 50* 35*  CALCIUM 10.2 10.0  CREATININE 1.91* 1.87*  GFRNONAA 35* 36*  GFRAA 41* 41*    LIVER  FUNCTION TESTS: Recent Labs    09/16/19 1623  BILITOT <0.2*  AST 20  ALT 38  ALKPHOS 105  PROT 7.9  ALBUMIN 4.2    TUMOR MARKERS: No results for input(s): AFPTM, CEA, CA199, CHROMGRNA in the last 8760 hours.  Assessment and Plan:  Left testicular cancer: The patient presents today to the [facility] department for the placement of a port-a-cath.   Labs and vitals have been reviewed, the patient has been NPO.   Risks and benefits of image-guided Port-a-catheter placement were discussed with the patient including, but not limited to bleeding, infection, pneumothorax, or fibrin sheath development and need for additional procedures.  All of the patient's questions were answered, patient is agreeable to proceed.  Consent signed and in chart.  Thank you for this interesting consult.  I greatly enjoyed meeting Patrick Bautista and look forward to participating in their care.  A copy of this report was sent to the requesting provider on this date.  Electronically Signed: Theresa Duty, NP 09/19/2019, 9:08 AM   I spent a total of  15 Minutes  in face to face in clinical consultation, greater than 50% of which was counseling/coordinating care for port-a-cath placement.

## 2019-09-19 NOTE — Discharge Instructions (Addendum)
Implanted Port Home Guide An implanted port is a device that is placed under the skin. It is usually placed in the chest. The device can be used to give IV medicine, to take blood, or for dialysis. You may have an implanted port if:  You need IV medicine that would be irritating to the small veins in your hands or arms.  You need IV medicines, such as antibiotics, for a long period of time.  You need IV nutrition for a long period of time.  You need dialysis. Having a port means that your health care provider will not need to use the veins in your arms for these procedures. You may have fewer limitations when using a port than you would if you used other types of long-term IVs, and you will likely be able to return to normal activities after your incision heals. An implanted port has two main parts:  Reservoir. The reservoir is the part where a needle is inserted to give medicines or draw blood. The reservoir is round. After it is placed, it appears as a small, raised area under your skin.  Catheter. The catheter is a thin, flexible tube that connects the reservoir to a vein. Medicine that is inserted into the reservoir goes into the catheter and then into the vein. How is my port accessed? To access your port:  A numbing cream may be placed on the skin over the port site.  Your health care provider will put on a mask and sterile gloves.  The skin over your port will be cleaned carefully with a germ-killing soap and allowed to dry.  Your health care provider will gently pinch the port and insert a needle into it.  Your health care provider will check for a blood return to make sure the port is in the vein and is not clogged.  If your port needs to remain accessed to get medicine continuously (constant infusion), your health care provider will place a clear bandage (dressing) over the needle site. The dressing and needle will need to be changed every week, or as told by your health care  provider. What is flushing? Flushing helps keep the port from getting clogged. Follow instructions from your health care provider about how and when to flush the port. Ports are usually flushed with saline solution or a medicine called heparin. The need for flushing will depend on how the port is used:  If the port is only used from time to time to give medicines or draw blood, the port may need to be flushed: ? Before and after medicines have been given. ? Before and after blood has been drawn. ? As part of routine maintenance. Flushing may be recommended every 4-6 [place].  If a constant infusion is running, the port may not need to be flushed.  Throw away any syringes in a disposal container that is meant for sharp items (sharps container). You can buy a sharps container from a pharmacy, or you can make one by using an empty hard plastic bottle with a cover. How long will my port stay implanted? The port can stay in for as long as your health care provider thinks it is needed. When it is time for the port to come out, a surgery will be done to remove it. The surgery will be similar to the procedure that was done to put the port in. Follow these instructions at home:   Flush your port as told by your health care provider.    If you need an infusion over several days, follow instructions from your health care provider about how to take care of your port site. Make sure you: ? Wash your hands with soap and water before you change your dressing. If soap and water are not available, use alcohol-based hand sanitizer. ? Change your dressing as told by your health care provider. ? Place any used dressings or infusion bags into a plastic bag. Throw that bag in the trash. ? Keep the dressing that covers the needle clean and dry. Do not get it wet. ? Do not use scissors or sharp objects near the tube. ? Keep the tube clamped, unless it is being used.  Check your port site every day for signs of  infection. Check for: ? Redness, swelling, or pain. ? Fluid or blood. ? Pus or a bad smell.  Protect the skin around the port site. ? Avoid wearing bra straps that rub or irritate the site. ? Protect the skin around your port from seat belts. Place a soft pad over your chest if needed.  Bathe or shower as told by your health care provider. The site may get wet as long as you are not actively receiving an infusion.  Return to your normal activities as told by your health care provider. Ask your health care provider what activities are safe for you.  Carry a medical alert card or wear a medical alert bracelet at all times. This will let health care providers know that you have an implanted port in case of an emergency. Get help right away if:  You have redness, swelling, or pain at the port site.  You have fluid or blood coming from your port site.  You have pus or a bad smell coming from the port site.  You have a fever. Summary  Implanted ports are usually placed in the chest for long-term IV access.  Follow instructions from your health care provider about flushing the port and changing bandages (dressings).  Take care of the area around your port by avoiding clothing that puts pressure on the area, and by watching for signs of infection.  Protect the skin around your port from seat belts. Place a soft pad over your chest if needed.  Get help right away if you have a fever or you have redness, swelling, pain, drainage, or a bad smell at the port site. This information is not intended to replace advice given to you by your health care provider. Make sure you discuss any questions you have with your health care provider. Document Revised: 08/02/2018 Document Reviewed: 05/13/2016 Elsevier Patient Education  [place] Insertion, Care After This sheet gives you information about how to care for yourself after your procedure. Your health care provider may  also give you more specific instructions. If you have problems or questions, contact your health care provider. What can I expect after the procedure? After the procedure, it is common to have:  Discomfort at the port insertion site.  Bruising on the skin over the port. This should improve over 3-4 days. Follow these instructions at home: [facility] care  After your port is placed, you will get a manufacturer's information card. The card has information about your port. Keep this card with you at all times.  Take care of the port as told by your health care provider. Ask your health care provider if you or a family member can get training for taking care of the port at  home. A home health care nurse may also take care of the port.  Make sure to remember what type of port you have. Incision care      Follow instructions from your health care provider about how to take care of your port insertion site. Make sure you: ? Wash your hands with soap and water before and after you change your bandage (dressing). If soap and water are not available, use hand sanitizer. ? Change your dressing as told by your health care provider. ? Leave stitches (sutures), skin glue, or adhesive strips in place. These skin closures may need to stay in place for 2 [place] or longer. If adhesive strip edges start to loosen and curl up, you may trim the loose edges. Do not remove adhesive strips completely unless your health care provider tells you to do that.  Check your port insertion site every day for signs of infection. Check for: ? Redness, swelling, or pain. ? Fluid or blood. ? Warmth. ? Pus or a bad smell. Activity  Return to your normal activities as told by your health care provider. Ask your health care provider what activities are safe for you.  Do not lift anything that is heavier than 10 lb (4.5 kg), or the limit that you are told, until your health care provider says that it is safe. General  instructions  Take over-the-counter and prescription medicines only as told by your health care provider.  Do not take baths, swim, or use a hot tub until your health care provider approves. Ask your health care provider if you may take showers. You may only be allowed to take sponge baths.  Do not drive for 24 hours if you were given a sedative during your procedure.  Wear a medical alert bracelet in case of an emergency. This will tell any health care providers that you have a port.  Keep all follow-up visits as told by your health care provider. This is important. Contact a health care provider if:  You cannot flush your port with saline as directed, or you cannot draw blood from the port.  You have a fever or chills.  You have redness, swelling, or pain around your port insertion site.  You have fluid or blood coming from your port insertion site.  Your port insertion site feels warm to the touch.  You have pus or a bad smell coming from the port insertion site. Get help right away if:  You have chest pain or shortness of breath.  You have bleeding from your port that you cannot control. Summary  Take care of the port as told by your health care provider. Keep the manufacturer's information card with you at all times.  Change your dressing as told by your health care provider.  Contact a health care provider if you have a fever or chills or if you have redness, swelling, or pain around your port insertion site.  Keep all follow-up visits as told by your health care provider. This information is not intended to replace advice given to you by your health care provider. Make sure you discuss any questions you have with your health care provider. Document Revised: 11/06/2017 Document Reviewed: 11/06/2017 Elsevier Patient Education  [place]. Moderate Conscious Sedation, Adult, Care After These instructions provide you with information about caring for yourself  after your procedure. Your health care provider may also give you more specific instructions. Your treatment has been planned according to current medical practices, but problems  sometimes occur. Call your health care provider if you have any problems or questions after your procedure. What can I expect after the procedure? After your procedure, it is common:  To feel sleepy for several hours.  To feel clumsy and have poor balance for several hours.  To have poor judgment for several hours.  To vomit if you eat too soon. Follow these instructions at home: For at least 24 hours after the procedure:   Do not: ? Participate in activities where you could fall or become injured. ? Drive. ? Use heavy machinery. ? Drink alcohol. ? Take sleeping pills or medicines that cause drowsiness. ? Make important decisions or sign legal documents. ? Take care of children on your own.  Rest. Eating and drinking  Follow the diet recommended by your health care provider.  If you vomit: ? Drink water, juice, or soup when you can drink without vomiting. ? Make sure you have little or no nausea before eating solid foods. General instructions  Have a responsible adult stay with you until you are awake and alert.  Take over-the-counter and prescription medicines only as told by your health care provider.  If you smoke, do not smoke without supervision.  Keep all follow-up visits as told by your health care provider. This is important. Contact a health care provider if:  You keep feeling nauseous or you keep vomiting.  You feel light-headed.  You develop a rash.  You have a fever. Get help right away if:  You have trouble breathing. This information is not intended to replace advice given to you by your health care provider. Make sure you discuss any questions you have with your health care provider. Document Revised: 03/23/2017 Document Reviewed: 07/31/2015 Elsevier Patient Education   2020 [employer].

## 2019-09-19 NOTE — Procedures (Signed)
Interventional Radiology Procedure Note  Procedure: Placement of a right IJ approach single lumen PowerPort.  Tip is positioned at the superior cavoatrial junction and catheter is ready for immediate use.  Complications: None Recommendations:  - Ok to shower tomorrow - Do not submerge for 7 days - Routine line care   Signed,  Tayte Childers S. Cella Cappello, DO   

## 2019-09-26 ENCOUNTER — Telehealth: Payer: Self-pay

## 2019-09-26 NOTE — Telephone Encounter (Signed)
Received call from patient wanting to know if he needs to fast before his PET/CT scan on 09/29/19, informed patient to have NPO 6 hours prior to scan and her verbalized understanding.

## 2019-09-29 ENCOUNTER — Encounter (HOSPITAL_COMMUNITY): Payer: Medicare Other

## 2019-09-30 ENCOUNTER — Other Ambulatory Visit: Payer: Self-pay

## 2019-09-30 ENCOUNTER — Ambulatory Visit (HOSPITAL_COMMUNITY)
Admission: RE | Admit: 2019-09-30 | Discharge: 2019-09-30 | Disposition: A | Discharge status: Home / Self Care | Payer: Medicare Other | Source: Ambulatory Visit | Attending: Hematology | Admitting: Hematology

## 2019-09-30 DIAGNOSIS — C8339 Diffuse large B-cell lymphoma, extranodal and solid organ sites: Secondary | ICD-10-CM | POA: Insufficient documentation

## 2019-09-30 DIAGNOSIS — E279 Disorder of adrenal gland, unspecified: Secondary | ICD-10-CM | POA: Diagnosis not present

## 2019-09-30 DIAGNOSIS — I700 Atherosclerosis of aorta: Secondary | ICD-10-CM | POA: Diagnosis not present

## 2019-09-30 DIAGNOSIS — I2510 Atherosclerotic heart disease of native coronary artery without angina pectoris: Secondary | ICD-10-CM | POA: Insufficient documentation

## 2019-09-30 LAB — GLUCOSE, CAPILLARY: Glucose-Capillary: 127 mg/dL — ABNORMAL HIGH (ref 70–99)

## 2019-09-30 MED ORDER — FLUDEOXYGLUCOSE F - 18 (FDG) INJECTION
12.3300 | Freq: Once | INTRAVENOUS | Status: AC | PRN
  Administered 2019-09-30: 12.33 via INTRAVENOUS

## 2019-10-06 ENCOUNTER — Other Ambulatory Visit: Payer: Self-pay | Admitting: Hematology

## 2019-10-06 ENCOUNTER — Encounter: Payer: Self-pay | Admitting: Hematology

## 2019-10-06 DIAGNOSIS — C8339 Diffuse large B-cell lymphoma, extranodal and solid organ sites: Secondary | ICD-10-CM

## 2019-10-06 DIAGNOSIS — C8330 Diffuse large B-cell lymphoma, unspecified site: Secondary | ICD-10-CM | POA: Insufficient documentation

## 2019-10-06 MED ORDER — LORAZEPAM 0.5 MG PO TABS: 0.5000 mg | ORAL_TABLET | Freq: Four times a day (QID) | ORAL | 0 refills | Status: DC | PRN

## 2019-10-06 MED ORDER — ONDANSETRON HCL 8 MG PO TABS: 8.0000 mg | ORAL_TABLET | Freq: Two times a day (BID) | ORAL | 1 refills | Status: DC | PRN

## 2019-10-06 MED ORDER — PREDNISONE 20 MG PO TABS: 60.0000 mg | ORAL_TABLET | Freq: Every day | ORAL | 5 refills | Status: DC

## 2019-10-06 MED ORDER — LIDOCAINE-PRILOCAINE 2.5-2.5 % EX CREA: TOPICAL_CREAM | CUTANEOUS | 3 refills | Status: DC

## 2019-10-06 MED ORDER — PROCHLORPERAZINE MALEATE 10 MG PO TABS: 10.0000 mg | ORAL_TABLET | Freq: Four times a day (QID) | ORAL | 6 refills | Status: DC | PRN

## 2019-10-06 NOTE — Progress Notes (Signed)
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

## 2019-10-07 ENCOUNTER — Telehealth: Payer: Self-pay | Admitting: Hematology

## 2019-10-07 ENCOUNTER — Telehealth: Payer: Self-pay | Admitting: *Deleted

## 2019-10-07 NOTE — Progress Notes (Signed)
10/07/19  .The following biosimilar Ellen Henri has been selected for use in this patient due to insurance requirements.  Henreitta Leber, PharmD

## 2019-10-07 NOTE — Telephone Encounter (Signed)
Patient called to f/u on Mychart message sent afternoon 6/14. He stated that the PET scan did not show any cancer that he could see and that he had other people read the report as well and that's what they all saw. He states that he has been anxiously waiting on the results and to know Dr. Grier Mitts plan.  Informed him that he was goig to be contacted this morning to advise him that Dr. Irene Limbo had entered his treatment plan yesterday and requested he be scheduled starting in next 2 [place] for chemo education and chemotherapy. Informed him that someone from [facility] scheduling will be contacting him in the next few days to schedule these appointments. He verbalized understanding and was encouraged to contact the office for further questions.

## 2019-10-07 NOTE — Telephone Encounter (Signed)
Scheduled appt per 6/14 sch message - pt is aware of appts added for this wk

## 2019-10-08 ENCOUNTER — Other Ambulatory Visit: Payer: Self-pay | Admitting: *Deleted

## 2019-10-08 ENCOUNTER — Inpatient Hospital Stay (HOSPITAL_BASED_OUTPATIENT_CLINIC_OR_DEPARTMENT_OTHER): Payer: Medicare Other | Admitting: Hematology

## 2019-10-08 ENCOUNTER — Other Ambulatory Visit: Payer: Self-pay

## 2019-10-08 ENCOUNTER — Inpatient Hospital Stay: Payer: Medicare Other | Attending: Hematology

## 2019-10-08 ENCOUNTER — Inpatient Hospital Stay: Payer: Medicare Other

## 2019-10-08 DIAGNOSIS — C83398 Diffuse large b-cell lymphoma of other extranodal and solid organ sites: Secondary | ICD-10-CM

## 2019-10-08 DIAGNOSIS — I700 Atherosclerosis of aorta: Secondary | ICD-10-CM | POA: Diagnosis not present

## 2019-10-08 DIAGNOSIS — C8339 Diffuse large B-cell lymphoma, extranodal and solid organ sites: Secondary | ICD-10-CM

## 2019-10-08 DIAGNOSIS — Z5111 Encounter for antineoplastic chemotherapy: Secondary | ICD-10-CM | POA: Insufficient documentation

## 2019-10-08 DIAGNOSIS — Z5112 Encounter for antineoplastic immunotherapy: Secondary | ICD-10-CM | POA: Diagnosis not present

## 2019-10-08 DIAGNOSIS — M1990 Unspecified osteoarthritis, unspecified site: Secondary | ICD-10-CM | POA: Diagnosis not present

## 2019-10-08 DIAGNOSIS — I10 Essential (primary) hypertension: Secondary | ICD-10-CM | POA: Diagnosis not present

## 2019-10-08 DIAGNOSIS — M5126 Other intervertebral disc displacement, lumbar region: Secondary | ICD-10-CM | POA: Diagnosis not present

## 2019-10-08 DIAGNOSIS — I2510 Atherosclerotic heart disease of native coronary artery without angina pectoris: Secondary | ICD-10-CM | POA: Diagnosis not present

## 2019-10-08 DIAGNOSIS — Z79899 Other long term (current) drug therapy: Secondary | ICD-10-CM | POA: Insufficient documentation

## 2019-10-08 DIAGNOSIS — K5792 Diverticulitis of intestine, part unspecified, without perforation or abscess without bleeding: Secondary | ICD-10-CM | POA: Insufficient documentation

## 2019-10-08 DIAGNOSIS — C8336 Diffuse large B-cell lymphoma, intrapelvic lymph nodes: Secondary | ICD-10-CM | POA: Insufficient documentation

## 2019-10-08 DIAGNOSIS — E119 Type 2 diabetes mellitus without complications: Secondary | ICD-10-CM | POA: Insufficient documentation

## 2019-10-08 DIAGNOSIS — Z5189 Encounter for other specified aftercare: Secondary | ICD-10-CM | POA: Insufficient documentation

## 2019-10-08 DIAGNOSIS — N183 Chronic kidney disease, stage 3 unspecified: Secondary | ICD-10-CM | POA: Diagnosis not present

## 2019-10-08 LAB — CMP (CANCER CENTER ONLY)
ALT: 20 U/L (ref 0–44)
AST: 18 U/L (ref 15–41)
Albumin: 4.3 g/dL (ref 3.5–5.0)
Alkaline Phosphatase: 85 U/L (ref 38–126)
Anion gap: 8 (ref 5–15)
BUN: 32 mg/dL — ABNORMAL HIGH (ref 8–23)
CO2: 23 mmol/L (ref 22–32)
Calcium: 10.3 mg/dL (ref 8.9–10.3)
Chloride: 107 mmol/L (ref 98–111)
Creatinine: 1.74 mg/dL — ABNORMAL HIGH (ref 0.61–1.24)
GFR, Est AFR Am: 45 mL/min — ABNORMAL LOW (ref >60)
GFR, Estimated: 39 mL/min — ABNORMAL LOW (ref >60)
Glucose, Bld: 188 mg/dL — ABNORMAL HIGH (ref 70–99)
Potassium: 5.4 mmol/L — ABNORMAL HIGH (ref 3.5–5.1)
Sodium: 138 mmol/L (ref 135–145)
Total Bilirubin: 0.5 mg/dL (ref 0.3–1.2)
Total Protein: 8 g/dL (ref 6.5–8.1)

## 2019-10-08 LAB — CBC WITH DIFFERENTIAL (CANCER CENTER ONLY)
Abs Immature Granulocytes: 0.03 10*3/uL (ref 0.00–0.07)
Basophils Absolute: 0 10*3/uL (ref 0.0–0.1)
Basophils Relative: 0 %
Eosinophils Absolute: 0.1 10*3/uL (ref 0.0–0.5)
Eosinophils Relative: 1 %
HCT: 37.7 % — ABNORMAL LOW (ref 39.0–52.0)
Hemoglobin: 12.3 g/dL — ABNORMAL LOW (ref 13.0–17.0)
Immature Granulocytes: 0 %
Lymphocytes Relative: 18 %
Lymphs Abs: 1.8 10*3/uL (ref 0.7–4.0)
MCH: 29.1 pg (ref 26.0–34.0)
MCHC: 32.6 g/dL (ref 30.0–36.0)
MCV: 89.3 fL (ref 80.0–100.0)
Monocytes Absolute: 0.8 10*3/uL (ref 0.1–1.0)
Monocytes Relative: 8 %
Neutro Abs: 7.1 10*3/uL (ref 1.7–7.7)
Neutrophils Relative %: 73 %
Platelet Count: 239 10*3/uL (ref 150–400)
RBC: 4.22 MIL/uL (ref 4.22–5.81)
RDW: 13.8 % (ref 11.5–15.5)
WBC Count: 9.8 10*3/uL (ref 4.0–10.5)
nRBC: 0 % (ref 0.0–0.2)

## 2019-10-08 LAB — LACTATE DEHYDROGENASE: LDH: 181 U/L (ref 98–192)

## 2019-10-08 MED ORDER — ERGOCALCIFEROL 1.25 MG (50000 UT) PO CAPS
50000.0000 [IU] | ORAL_CAPSULE | ORAL | 2 refills | Status: DC
Start: 2019-10-09 — End: 2023-08-20

## 2019-10-08 NOTE — Progress Notes (Signed)
HEMATOLOGY/ONCOLOGY CONSULTATION NOTE  Date of Service: 10/08/2019  Patient Care Team: [name], L.[name], MD as PCP - General (Family Medicine)  CHIEF COMPLAINTS/PURPOSE OF CONSULTATION:  DLBCL  HISTORY OF PRESENTING ILLNESS:   Patrick Bautista is a wonderful 69 y.o. male who has been referred to [place] by Dr. 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 Pt is accompanied today by his girlfriend, [name]. The pt reports that he is doing well overall.    Pt received his second COVID19 vaccine on 02/13. Soon after he began having sweating in his groin, numbness in his left thigh, and numbness in two fingers on his left hand. After the symptoms began pt went to see his PCP who thought he had a hydrocele. He returned a month later, with continued symptoms, and received a testicular US which found a mass. Pt had a left Orchiectomy performed on 09/03/2019. The pt reports that he saw Dr. Louis Meckel this morning and has been healing well from his surgery.   His Diabetes has been relatively well-controlled with diet and Glimepiride. It has been many years since he has had concerns with Diverticulitis and last had kidney stones in 2017. He has previously been seen by a Nephrologist who felt that his kidney disease could have been partially caused by Cystoscopy with Dr. Louis Meckel in 2018. Pt has no history of lung or heart dysfunction or neuropathy.   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   Of note prior to the patient's visit today, pt has had Left Testicle and Spermatic Cord Surgical Pathology ([id number]) completed on 09/03/2019 with results revealing "Diffuse large B-cell lymphoma."   Pt has had CT C/A/P completed on 08/26/2019 with results revealing "1. Large left-sided testicular mass consistent with known testicular cancer. 2. No findings for metastatic disease involving the chest, abdomen, or pelvis. 3. A few small scattered  retroperitoneal lymph nodes but no mass or overt adenopathy. "  Most recent lab results (09/01/2019) of CBC w/diff and CMP is as follows: all values are WNL except for Hgb at 12.7, Glucose at 177, BUN at 50, Creatinine at 1.91, GFR Est Non Af Am at 41.  On review of systems, pt reports anxiety, stress and denies neuropathy, right testicular pain/swelling, abdominal pain and any other symptoms.   On PMHx the pt reports HTN, Diabetes Type II, Diverticulitis, Arthritis, Lumbar herniated disc, Left Orchiectomy. On Social Hx the pt reports that he is a retired Agricultural consultant.   INTERVAL HISTORY:   I connected with  Sheela Stack on 10/08/19 by telephone and verified that I am speaking with the correct person using two identifiers.   I discussed the limitations of evaluation and management by telemedicine. The patient expressed understanding and agreed to proceed.  Other persons participating in the visit and their role in the encounter:      -[name], Medical Scribe     -Pt's sister  Patient's location: Home Provider's location: [place] [facility] [place] is a wonderful 69 y.o. 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 The patient's last visit with [place] was on 09/16/2019. The pt reports that he is doing well overall.  The pt reports that he is healing well from surgery and has been eating and drinking well. He has no new concerns or symptoms.   Of note since the patient's last visit, pt has had PET/CT ([id number]) completed on 09/30/2019 with results revealing "  1. Small right adrenal nodule exhibits intense tracer uptake with SUV max of 13.03. Compared with the study from 08/26/2019 this appears to represent a new finding. This nodule has mean Hounsfield units of 31.5 compared with mean Hounsfield units of 1.44 on 08/26/2019. Cannot rule out right adrenal gland involvement. 2. No FDG avid (Deauville criteria 3 or greater) lymph nodes or mass  identified within the remainder of the neck, chest, abdomen or pelvis. 3. Aortic atherosclerosis, in addition to RCA and LAD coronary artery disease. Please note that although the presence of coronary artery calcium documents the presence of coronary artery disease, the severity of this disease and any potential stenosis cannot be assessed on this non-gated CT examination. Assessment for potential risk factor modification, dietary therapy or pharmacologic therapy may be warranted, if clinically indicated. Aortic Atherosclerosis (ICD10-I70.0)."  Lab results today (10/08/19) of CBC w/diff and CMP is as follows: all values are WNL except for Hgb at 12.3, HCT at 37.7, Potassium at 5.4, Glucose at 188, BUN at 32, Creatinine at 1.74, GFR Est Non Af Am at 39. 10/08/2019 LDH at 181  On review of systems, pt denies any other symptoms.   MEDICAL HISTORY:  Past Medical History:  Diagnosis Date  . Arthritis   . Diabetes mellitus without complication ([place])    type 2  . Diverticulitis 15 yrs ago  . History of kidney stones    right ureteral stone and one in 2017  . Hypertension   . Lumbar herniated disc    L 4 L 5     SURGICAL HISTORY: Past Surgical History:  Procedure Laterality Date  . colonscopy  15 yrs ago  . CYSTOSCOPY/URETEROSCOPY/HOLMIUM LASER/STENT PLACEMENT Right 02/16/2017   Procedure: CYSTOSCOPY/RETROGRADE/URETEROSCOPY/HOLMIUM LASER/STENT PLACEMENT;  Surgeon: Ardis Hughs, MD;  Location: [facility];  Service: Urology;  Laterality: Right;  NEEDS 60 MIN FOR PROCEDURE  . IR IMAGING GUIDED PORT INSERTION  09/19/2019  . MOUTH SURGERY  20 yrs ago  . ORCHIECTOMY Left 09/03/2019   Procedure: LEFT ORCHIECTOMY;  Surgeon: Ardis Hughs, MD;  Location: [facility];  Service: Urology;  Laterality: Left;    SOCIAL HISTORY: Social History   Socioeconomic History  . Marital status: Legally Separated    Spouse name: Not on file  . Number of children: Not on file  . Years of education: Not on  file  . Highest education level: Not on file  Occupational History  . Not on file  Tobacco Use  . Smoking status: Never Smoker  . Smokeless tobacco: Never Used  Vaping Use  . Vaping Use: Never used  Substance and Sexual Activity  . Alcohol use: No  . Drug use: No  . Sexual activity: Not on file  Other Topics Concern  . Not on file  Social History Narrative  . Not on file   Social Determinants of Health   Financial Resource Strain:   . Difficulty of Paying Living Expenses:   Food Insecurity:   . Worried About Charity fundraiser in the Last Year:   . Arboriculturist in the Last Year:   Transportation Needs:   . Film/video editor (Medical):   Marland Kitchen Lack of Transportation (Non-Medical):   Physical Activity:   . Days of Exercise per Week:   . Minutes of Exercise per Session:   Stress:   . Feeling of Stress :   Social Connections:   . Frequency of Communication with Friends and Family:   . Frequency of Social  Gatherings with Friends and Family:   . Attends Religious Services:   . Active Member of Clubs or Organizations:   . Attends Archivist Meetings:   Marland Kitchen Marital Status:   Intimate Partner Violence:   . Fear of Current or Ex-Partner:   . Emotionally Abused:   Marland Kitchen Physically Abused:   . Sexually Abused:     FAMILY HISTORY: No family history on file.  ALLERGIES:  is allergic to hydrocodone, percocet [oxycodone-acetaminophen], codeine, penicillins, and tape.  MEDICATIONS:  Current Outpatient Medications  Medication Sig Dispense Refill  . acetaminophen (TYLENOL) 500 MG tablet Take 500-1,000 mg by mouth every 6 (six) hours as needed (pain.).     Marland Kitchen ALPRAZolam (XANAX) 0.25 MG tablet Take 0.25 mg by mouth at bedtime as needed for sleep.    Marland Kitchen amLODipine (NORVASC) 2.5 MG tablet Take 2.5 mg by mouth daily. 2.5 mg + 5 mg =7.5 mg total daily dose    . amLODipine (NORVASC) 5 MG tablet Take 5 mg by mouth daily. 2.5 mg + 5 mg =7.5 mg total daily dose  11  . citalopram  (CELEXA) 10 MG tablet Take 1 tablet (10 mg total) by mouth daily. 30 tablet 2  . [START ON 10/09/2019] ergocalciferol (VITAMIN D2) 1.25 MG (50000 UT) capsule Take 1 capsule (50,000 Units total) by mouth 2 (two) times a week. 24 capsule 2  . fluocinonide cream (LIDEX) 8.93 % Apply 1 application topically 2 (two) times daily as needed (skin irritation.).    Marland Kitchen glimepiride (AMARYL) 1 MG tablet Take 1 mg by mouth daily.  12  . lidocaine-prilocaine (EMLA) cream Apply to affected area once 30 g 3  . LORazepam (ATIVAN) 0.5 MG tablet Take 1 tablet (0.5 mg total) by mouth every 6 (six) hours as needed (Nausea or vomiting). 30 tablet 0  . olmesartan-hydrochlorothiazide (BENICAR HCT) 40-25 MG tablet Take 1 tablet by mouth daily.    . ondansetron (ZOFRAN) 8 MG tablet Take 1 tablet (8 mg total) by mouth 2 (two) times daily as needed for refractory nausea / vomiting. Start on day 3 after cyclophosphamide chemotherapy. 30 tablet 1  . predniSONE (DELTASONE) 20 MG tablet Take 3 tablets (60 mg total) by mouth daily with breakfast. Take with food on days 1-5 of chemotherapy. 15 tablet 5  . prochlorperazine (COMPAZINE) 10 MG tablet Take 1 tablet (10 mg total) by mouth every 6 (six) hours as needed (Nausea or vomiting). 30 tablet 6  . traMADol (ULTRAM) 50 MG tablet Take 1-2 tablets (50-100 mg total) by mouth every 6 (six) hours as needed for moderate pain. 15 tablet 0   No current facility-administered medications for this visit.    REVIEW OF SYSTEMS:   A 10+ POINT REVIEW OF SYSTEMS WAS OBTAINED including neurology, dermatology, psychiatry, cardiac, respiratory, lymph, extremities, GI, GU, Musculoskeletal, constitutional, breasts, reproductive, HEENT.  All pertinent positives are noted in the HPI.  All others are negative.   PHYSICAL EXAMINATION: ECOG PERFORMANCE STATUS: 0 - Asymptomatic  . There were no vitals filed for this visit. There were no vitals filed for this visit. .There is no height or weight on file to  calculate BMI.  Telehealth visit   LABORATORY DATA:  I have reviewed the data as listed  . CBC Latest Ref Rng & Units 10/08/2019 09/16/2019 09/01/2019  WBC 4.0 - 10.5 K/uL 9.8 9.5 7.5  Hemoglobin 13.0 - 17.0 g/dL 12.3(L) 12.0(L) 12.7(L)  Hematocrit 39 - 52 % 37.7(L) 36.5(L) 39.3  Platelets 150 - 400  K/uL 239 255 258    . CMP Latest Ref Rng & Units 10/08/2019 09/16/2019 09/01/2019  Glucose 70 - 99 mg/dL 188(H) 177(H) 177(H)  BUN 8 - 23 mg/dL 32(H) 35(H) 50(H)  Creatinine 0.61 - 1.24 mg/dL 1.74(H) 1.87(H) 1.91(H)  Sodium 135 - 145 mmol/L 138 140 139  Potassium 3.5 - 5.1 mmol/L 5.4(H) 4.5 5.1  Chloride 98 - 111 mmol/L 107 106 107  CO2 22 - 32 mmol/L 23 24 23   Calcium 8.9 - 10.3 mg/dL 10.3 10.0 10.2  Total Protein 6.5 - 8.1 g/dL 8.0 7.9 -  Total Bilirubin 0.3 - 1.2 mg/dL 0.5 <0.2(L) -  Alkaline Phos 38 - 126 U/L 85 105 -  AST 15 - 41 U/L 18 20 -  ALT 0 - 44 U/L 20 38 -   09/03/2019 Left Testicle and Spermatic Cord Surgical Pathology ([id number]) :    RADIOGRAPHIC STUDIES: I have personally reviewed the radiological images as listed and agreed with the findings in the report. NM PET Image Initial (PI) Skull Base To Thigh  Result Date: 10/01/2019 CLINICAL DATA:  Initial treatment strategy for testicular large B-cell lymphoma. EXAM: NUCLEAR MEDICINE PET SKULL BASE TO THIGH TECHNIQUE: 12.3 mCi F-18 FDG was injected intravenously. Full-ring PET imaging was performed from the skull base to thigh after the radiotracer. CT data was obtained and used for attenuation correction and anatomic localization. Fasting blood glucose: 127 mg/dl COMPARISON:  CT CAP 08/26/2019 FINDINGS: Mediastinal blood pool activity: SUV max 3.8 Liver activity: SUV max 5.47 NECK: No hypermetabolic lymph nodes in the neck. Incidental CT findings: none CHEST: No hypermetabolic mediastinal or hilar nodes. No suspicious pulmonary nodules on the CT scan. Incidental CT findings: Mild aortic atherosclerosis. RCA and LAD  coronary artery calcifications. No pericardial effusion. ABDOMEN/PELVIS: There is no abnormal areas of increased tracer activity within the liver, pancreas, and spleen. Right adrenal gland nodule appears FDG avid measuring 1.1 x 0.8 cm within SUV max of 13.03, image 121/4. No abnormal uptake within the left adrenal gland. No FDG avid or enlarged abdominal lymph nodes. Left external iliac lymph node measures 0.9 cm and has an SUV max of 2.59, image 184/4. Possibly reactive. Incidental CT findings: Aortic atherosclerosis. No aneurysm. Postoperative changes from left orchiectomy. Asymmetric increased radiotracer uptake within the left inguinal canal is identified with SUV max of 5.45. Favor postsurgical change. SKELETON: No focal hypermetabolic activity to suggest skeletal metastasis. Incidental CT findings: none IMPRESSION: 1. Small right adrenal nodule exhibits intense tracer uptake with SUV max of 13.03. Compared with the study from 08/26/2019 this appears to represent a new finding. This nodule has mean Hounsfield units of 31.5 compared with mean Hounsfield units of 1.44 on 08/26/2019. Cannot rule out right adrenal gland involvement. 2. No FDG avid (Deauville criteria 3 or greater) lymph nodes or mass identified within the remainder of the neck, chest, abdomen or pelvis. 3. Aortic atherosclerosis, in addition to RCA and LAD coronary artery disease. Please note that although the presence of coronary artery calcium documents the presence of coronary artery disease, the severity of this disease and any potential stenosis cannot be assessed on this non-gated CT examination. Assessment for potential risk factor modification, dietary therapy or pharmacologic therapy may be warranted, if clinically indicated. Aortic Atherosclerosis (ICD10-I70.0). Electronically Signed   By: Kerby Moors M.D.   On: 10/01/2019 11:32   ECHOCARDIOGRAM COMPLETE  Result Date: 09/18/2019    ECHOCARDIOGRAM REPORT   Patient Name:   Kendrell Lottman Date of Exam: 09/18/2019 Medical  Rec #:  [id number]   Height:       70.0 in Accession #:    [id number]  Weight:       223.0 lb Date of Birth:  05-02-1950   BSA:          2.186 m Patient Age:    6 years    BP:           134/69 mmHg Patient Gender: M           HR:           82 bpm. Exam Location:  Inpatient Procedure: 2D Echo, 3D Echo, Color Doppler, Cardiac Doppler and Strain Analysis Indications:    Chemo Evaluation v87.41  History:        Patient has no prior history of Echocardiogram examinations.                 Risk Factors:Hypertension and Diabetes.  Sonographer:    Raquel Sarna Senior RDCS Referring Phys: Brunetta Genera IMPRESSIONS  1. Normal LV systolic function; grade 1 diastolic dysfunction; KDT-26.7%.  2. Left ventricular ejection fraction, by estimation, is 55 to 60%. The left ventricle has normal function. The left ventricle has no regional wall motion abnormalities. Left ventricular diastolic parameters are consistent with Grade I diastolic dysfunction (impaired relaxation).  3. Right ventricular systolic function is normal. The right ventricular size is normal.  4. The mitral valve is normal in structure. No evidence of mitral valve regurgitation. No evidence of mitral stenosis.  5. The aortic valve is tricuspid. Aortic valve regurgitation is not visualized. No aortic stenosis is present.  6. The inferior vena cava is normal in size with greater than 50% respiratory variability, suggesting right atrial pressure of 3 mmHg. FINDINGS  Left Ventricle: Left ventricular ejection fraction, by estimation, is 55 to 60%. The left ventricle has normal function. The left ventricle has no regional wall motion abnormalities. The left ventricular internal cavity size was normal in size. There is  no left ventricular hypertrophy. Left ventricular diastolic parameters are consistent with Grade I diastolic dysfunction (impaired relaxation). Right Ventricle: The right ventricular size is normal. Right ventricular  systolic function is normal. Left Atrium: Left atrial size was normal in size. Right Atrium: Right atrial size was normal in size. Pericardium: There is no evidence of pericardial effusion. Mitral Valve: The mitral valve is normal in structure. Normal mobility of the mitral valve leaflets. No evidence of mitral valve regurgitation. No evidence of mitral valve stenosis. Tricuspid Valve: The tricuspid valve is normal in structure. Tricuspid valve regurgitation is not demonstrated. No evidence of tricuspid stenosis. Aortic Valve: The aortic valve is tricuspid. Aortic valve regurgitation is not visualized. No aortic stenosis is present. Pulmonic Valve: The pulmonic valve was normal in structure. Pulmonic valve regurgitation is not visualized. No evidence of pulmonic stenosis. Aorta: The aortic root is normal in size and structure. Venous: The inferior vena cava is normal in size with greater than 50% respiratory variability, suggesting right atrial pressure of 3 mmHg. IAS/Shunts: No atrial level shunt detected by color flow Doppler. Additional Comments: Normal LV systolic function; grade 1 diastolic dysfunction; TIW-58.0%.  LEFT VENTRICLE PLAX 2D LVIDd:         4.40 cm  Diastology LVIDs:         2.70 cm  LV e' lateral:   8.38 cm/s LV PW:         1.00 cm  LV E/e' lateral: 8.8 LV IVS:  0.90 cm  LV e' medial:    7.51 cm/s LVOT diam:     2.00 cm  LV E/e' medial:  9.8 LV SV:         71 LV SV Index:   32 LVOT Area:     3.14 cm                          3D Volume EF:                         3D EF:        65 %                         LV EDV:       130 ml                         LV ESV:       46 ml                         LV SV:        84 ml RIGHT VENTRICLE RV S prime:     11.10 cm/s TAPSE (M-mode): 2.1 cm LEFT ATRIUM             Index       RIGHT ATRIUM           Index LA diam:        2.80 cm 1.28 cm/m  RA Area:     18.20 cm LA Vol (A2C):   59.6 ml 27.26 ml/m RA Volume:   50.90 ml  23.28 ml/m LA Vol (A4C):   36.7 ml  16.79 ml/m LA Biplane Vol: 48.2 ml 22.05 ml/m  AORTIC VALVE LVOT Vmax:   104.00 cm/s LVOT Vmean:  75.600 cm/s LVOT VTI:    0.226 m  AORTA Ao Root diam: 3.40 cm Ao Asc diam:  3.30 cm MITRAL VALVE MV Area (PHT): 3.12 cm    SHUNTS MV Decel Time: 243 msec    Systemic VTI:  0.23 m MV E velocity: 73.70 cm/s  Systemic Diam: 2.00 cm MV A velocity: 66.80 cm/s MV E/A ratio:  1.10 Kirk Ruths MD Electronically signed by Kirk Ruths MD Signature Date/Time: 09/18/2019/1:45:55 PM    Final    IR IMAGING GUIDED PORT INSERTION  Result Date: 09/19/2019 INDICATION: 69 year old male with a history of testicular lymphoma referred for port catheter placement EXAM: IMAGE GUIDED PORT CATHETER MEDICATIONS: 1.5 g vancomycin; The antibiotic was administered within an appropriate time interval prior to skin puncture. ANESTHESIA/SEDATION: Moderate (conscious) sedation was employed during this procedure. A total of Versed 4.0 mg and Fentanyl 100 mcg was administered intravenously. Moderate Sedation Time: 23 minutes. The patient's level of consciousness and vital signs were monitored continuously by radiology nursing throughout the procedure under my direct supervision. FLUOROSCOPY TIME:  Fluoroscopy Time: 0 minutes 12 seconds (2 mGy). COMPLICATIONS: None PROCEDURE: The procedure, risks, benefits, and alternatives were explained to the patient. Questions regarding the procedure were encouraged and answered. The patient understands and consents to the procedure. Ultrasound survey was performed with images stored and sent to PACs. The right neck and chest was prepped with chlorhexidine, and draped in the usual sterile fashion using maximum barrier technique (cap and mask, sterile gown, sterile gloves, large sterile sheet, hand hygiene and cutaneous antiseptic).  Antibiotic prophylaxis was provided with 2.0g Ancef administered IV one hour prior to skin incision. Local anesthesia was attained by infiltration with 1% lidocaine without  epinephrine. Ultrasound demonstrated patency of the right internal jugular vein, and this was documented with an image. Under real-time ultrasound guidance, this vein was accessed with a 21 gauge micropuncture needle and image documentation was performed. A small dermatotomy was made at the access site with an 11 scalpel. A 0.018" wire was advanced into the SVC and used to estimate the length of the internal catheter. The access needle exchanged for a 23F micropuncture vascular sheath. The 0.018" wire was then removed and a 0.035" wire advanced into the IVC. An appropriate location for the subcutaneous reservoir was selected below the clavicle and an incision was made through the skin and underlying soft tissues. The subcutaneous tissues were then dissected using a combination of blunt and sharp surgical technique and a pocket was formed. A single lumen power injectable portacatheter was then tunneled through the subcutaneous tissues from the pocket to the dermatotomy and the port reservoir placed within the subcutaneous pocket. The venous access site was then serially dilated and a peel away vascular sheath placed over the wire. The wire was removed and the port catheter advanced into position under fluoroscopic guidance. The catheter tip is positioned in the cavoatrial junction. This was documented with a spot image. The portacatheter was then tested and found to flush and aspirate well. The port was flushed with saline followed by 100 units/mL heparinized saline. The pocket was then closed in two layers using first subdermal inverted interrupted absorbable sutures followed by a running subcuticular suture. The epidermis was then sealed with Dermabond. The dermatotomy at the venous access site was also seal with Dermabond. Patient tolerated the procedure well and remained hemodynamically stable throughout. No complications encountered and no significant blood loss encountered IMPRESSION: Status post right IJ port  catheter placement. Signed, Dulcy Fanny. Dellia Nims, RPVI [facility] Radiology Electronically Signed   By: Corrie Mckusick D.O.   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 Large left-sided testicular mass consistent with known testicular cancer. 2. No findings for metastatic disease involving the chest, abdomen, or pelvis. 3. A few small scattered retroperitoneal lymph nodes but no mass or overt adenopathy. " 2) .Marland Kitchen Past Medical History:  Diagnosis Date  . Arthritis   . Diabetes mellitus without complication ([place])    type 2  . Diverticulitis 15 yrs ago  . History of kidney stones    right ureteral stone and one in 2017  . Hypertension   . Lumbar herniated disc    L 4 L 5   3) CKD stage 3 PLAN: -Discussed pt labwork today, 10/08/19; blood counts are nml, kidney function is stable, liver enzymes are nml, hyperkalemia, LDH is WNL -Discussed 09/30/2019 PET/CT ([id number]) which revealed "1. Small right adrenal nodule exhibits intense tracer uptake with SUV max of 13.03. Compared with the study from 08/26/2019 this appears to represent a new finding. This nodule has mean Hounsfield units of 31.5 compared with mean Hounsfield units of 1.44 on 08/26/2019. Cannot rule out right adrenal gland involvement. 2. No FDG avid (Deauville criteria 3 or greater) lymph nodes or mass identified within the remainder of  the neck, chest, abdomen or pelvis. 3. Aortic atherosclerosis, in addition to RCA and LAD coronary artery disease. Please note that although the presence of coronary artery calcium documents the presence of coronary artery disease, the severity of this disease and any potential stenosis cannot be assessed on this non-gated  CT examination. Assessment for potential risk factor modification, dietary therapy or pharmacologic therapy may be warranted, if clinically indicated. Aortic Atherosclerosis (ICD10-I70.0)." -09/18/2019 ECHO shows normal ejection fraction, some diastolic dysfunction -Advised pt that the adrenal nodule is likely caused by lymphoma. Would not recommend a biopsy at this time, as it would not change the course of treatment  -Advised pt that his primary testicular DLBCL is at least stage 4 due to being present on a solid organ. -Advised pt that Benicar HCT can increase Potassium levels.  -Recommend pt connect with Dr. Chalmers Cater for Benicar HCT management and limit dietary Potassium.  -Recommend pt take 50K UT Vitamin D twice per week. Will replace aggressively in the setting of upcoming treatment and CKD.  -The pt has no prohibitive toxicities from beginning R-CHOP with G-CSF support at this time.  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 -Plan to give RT to his right testicle after the completion of chemotherapy.  -Rx Ergocalciferol   -Will see back 1 week after C1D1 R-CHOP   FOLLOW UP: RTC with Dr Irene Limbo in 8-10 days with labs for toxicity check   The total time spent in the appt was 30 minutes and more than 50% was on counseling and direct patient cares.  All of the patient's questions were answered with apparent satisfaction. The patient knows to call the clinic with any problems, questions or concerns.    Sullivan Lone MD [place] AAHIVMS [facility] Hematology/Oncology Physician [facility]  (Office):       [phone] (Work cell):  [phone] (Fax):           [phone]  10/08/2019 4:56 PM  I, [name], am acting as a scribe for Dr. Sullivan Lone.   .I have reviewed the above documentation for accuracy and completeness, and I agree with the above. Brunetta Genera MD

## 2019-10-09 ENCOUNTER — Other Ambulatory Visit: Payer: Self-pay | Admitting: Hematology

## 2019-10-09 ENCOUNTER — Encounter: Payer: Self-pay | Admitting: Hematology

## 2019-10-09 NOTE — Telephone Encounter (Signed)
Patient requesting refill. 

## 2019-10-09 NOTE — Progress Notes (Signed)
Called pt to introduce myself as his Arboriculturist.  Unfortunately there aren't any foundations offering copay assistance for his Dx and the type of ins he has.  I informed him of the [employer], went over what it covers and gave him the income requirement.  Pt stated he exceeds the amount so he doesn't qualify for the grant at this time.  I requested for the front staff give him my card for any questions or concerns he may have in the future.

## 2019-10-10 ENCOUNTER — Other Ambulatory Visit: Payer: Self-pay | Admitting: Hematology

## 2019-10-10 ENCOUNTER — Other Ambulatory Visit: Payer: Self-pay

## 2019-10-10 ENCOUNTER — Inpatient Hospital Stay: Payer: Medicare Other

## 2019-10-10 VITALS — BP 108/63 | HR 84 | Temp 98.0°F | Resp 18

## 2019-10-10 DIAGNOSIS — Z5112 Encounter for antineoplastic immunotherapy: Secondary | ICD-10-CM | POA: Diagnosis not present

## 2019-10-10 DIAGNOSIS — C8339 Diffuse large B-cell lymphoma, extranodal and solid organ sites: Secondary | ICD-10-CM

## 2019-10-10 MED ORDER — SODIUM CHLORIDE 0.9 % IV SOLN
150.0000 mg | Freq: Once | INTRAVENOUS | Status: AC
  Administered 2019-10-10: 150 mg via INTRAVENOUS
  Filled 2019-10-10: qty 150

## 2019-10-10 MED ORDER — DIPHENHYDRAMINE HCL 25 MG PO CAPS
50.0000 mg | ORAL_CAPSULE | Freq: Once | ORAL | Status: AC
  Administered 2019-10-10: 50 mg via ORAL

## 2019-10-10 MED ORDER — PALONOSETRON HCL INJECTION 0.25 MG/5ML
0.2500 mg | Freq: Once | INTRAVENOUS | Status: AC
  Administered 2019-10-10: 0.25 mg via INTRAVENOUS

## 2019-10-10 MED ORDER — HEPARIN SOD (PORK) LOCK FLUSH 100 UNIT/ML IV SOLN
500.0000 [IU] | Freq: Once | INTRAVENOUS | Status: AC | PRN
  Administered 2019-10-10: 500 [IU]
  Filled 2019-10-10: qty 5

## 2019-10-10 MED ORDER — SODIUM CHLORIDE 0.9% FLUSH
10.0000 mL | INTRAVENOUS | Status: DC | PRN
  Administered 2019-10-10 (×2): 10 mL
  Filled 2019-10-10: qty 10

## 2019-10-10 MED ORDER — SODIUM CHLORIDE 0.9 % IV SOLN
375.0000 mg/m2 | Freq: Once | INTRAVENOUS | Status: AC
  Administered 2019-10-10: 800 mg via INTRAVENOUS
  Filled 2019-10-10: qty 50

## 2019-10-10 MED ORDER — SODIUM CHLORIDE 0.9 % IV SOLN
Freq: Once | INTRAVENOUS | Status: AC
  Filled 2019-10-10: qty 250

## 2019-10-10 MED ORDER — SODIUM CHLORIDE 0.9 % IV SOLN
10.0000 mg | Freq: Once | INTRAVENOUS | Status: AC
  Administered 2019-10-10: 10 mg via INTRAVENOUS
  Filled 2019-10-10: qty 10

## 2019-10-10 MED ORDER — ACETAMINOPHEN 325 MG PO TABS
650.0000 mg | ORAL_TABLET | Freq: Once | ORAL | Status: AC
  Administered 2019-10-10: 650 mg via ORAL

## 2019-10-10 MED ORDER — FAMOTIDINE IN NACL 20-0.9 MG/50ML-% IV SOLN
INTRAVENOUS | Status: AC
  Filled 2019-10-10: qty 50

## 2019-10-10 MED ORDER — VINCRISTINE SULFATE CHEMO INJECTION 1 MG/ML
2.0000 mg | Freq: Once | INTRAVENOUS | Status: AC
  Administered 2019-10-10: 2 mg via INTRAVENOUS
  Filled 2019-10-10: qty 2

## 2019-10-10 MED ORDER — FAMOTIDINE IN NACL 20-0.9 MG/50ML-% IV SOLN
20.0000 mg | Freq: Once | INTRAVENOUS | Status: AC | PRN
  Administered 2019-10-10: 20 mg via INTRAVENOUS

## 2019-10-10 MED ORDER — DOXORUBICIN HCL CHEMO IV INJECTION 2 MG/ML
50.0000 mg/m2 | Freq: Once | INTRAVENOUS | Status: AC
  Administered 2019-10-10: 112 mg via INTRAVENOUS
  Filled 2019-10-10: qty 56

## 2019-10-10 MED ORDER — SODIUM CHLORIDE 0.9 % IV SOLN
500.0000 mg/m2 | Freq: Once | INTRAVENOUS | Status: AC
  Administered 2019-10-10: 1120 mg via INTRAVENOUS
  Filled 2019-10-10: qty 56

## 2019-10-10 NOTE — Progress Notes (Signed)
Scr.1.74, okay to treat, per Dr. Irene Limbo. Cytoxan dose decreased, per MD.   Patrick Bautista through Rituxan first time titration, Pt. [place] sore throat, "sinus stuff". Dr. Lorenso Courier at [place]. Pepcid given. Okay to continue titration rate increase.

## 2019-10-10 NOTE — Patient Instructions (Signed)
[facility] Discharge Instructions for Patients Receiving Chemotherapy  Today you received the following chemotherapy agents Doxorubicin, Vincristine, Cytoxan, Rituxan.  To help prevent nausea and vomiting after your treatment, we encourage you to take your nausea medication Do not take Zofran for three days after treatment.   If you develop nausea and vomiting that is not controlled by your nausea medication, call the clinic.   BELOW ARE SYMPTOMS THAT SHOULD BE REPORTED IMMEDIATELY:  *FEVER GREATER THAN 100.5 F  *CHILLS WITH OR WITHOUT FEVER  NAUSEA AND VOMITING THAT IS NOT CONTROLLED WITH YOUR NAUSEA MEDICATION  *UNUSUAL SHORTNESS OF BREATH  *UNUSUAL BRUISING OR BLEEDING  TENDERNESS IN MOUTH AND THROAT WITH OR WITHOUT PRESENCE OF ULCERS  *URINARY PROBLEMS  *BOWEL PROBLEMS  UNUSUAL RASH Items with * indicate a potential emergency and should be followed up as soon as possible.  Feel free to call the clinic should you have any questions or concerns. The clinic phone number is [phone].  Please show the [facility] at check-in to the Emergency Department and triage nurse.  Vincristine injection What is this medicine? VINCRISTINE (vin KRIS teen) is a chemotherapy drug. It slows the growth of cancer cells. This medicine is used to treat many types of cancer like Hodgkin's disease, leukemia, non-Hodgkin's lymphoma, neuroblastoma (brain cancer), rhabdomyosarcoma, and Wilms' tumor. This medicine may be used for other purposes; ask your health care provider or pharmacist if you have questions. COMMON BRAND NAME(S): Oncovin, Vincasar PFS What should I tell my health care provider before I take this medicine? They need to know if you have any of these conditions:  blood disorders  gout  infection (especially chickenpox, cold sores, or herpes)  kidney disease  liver disease  lung disease  nervous system disease like Charcot-Marie-Tooth  (CMT)  recent or ongoing radiation therapy  an unusual or allergic reaction to vincristine, other chemotherapy agents, other medicines, foods, dyes, or preservatives  pregnant or trying to get pregnant  breast-feeding How should I use this medicine? This drug is given as an infusion into a vein. It is administered in a hospital or clinic by a specially trained health care professional. If you have pain, swelling, burning, or any unusual feeling around the site of your injection, tell your health care professional right away. Talk to your pediatrician regarding the use of this medicine in children. While this drug may be prescribed for selected conditions, precautions do apply. Overdosage: If you think you have taken too much of this medicine contact a poison control center or emergency room at once. NOTE: This medicine is only for you. Do not share this medicine with others. What if I miss a dose? It is important not to miss your dose. Call your doctor or health care professional if you are unable to keep an appointment. What may interact with this medicine? Do not take this medicine with any of the following medications:  itraconazole  mibefradil  voriconazole This medicine may also interact with the following medications:  cyclosporine  erythromycin  fluconazole  ketoconazole  medicines for HIV like delavirdine, efavirenz, nevirapine  medicines for seizures like ethotoin, fosphenotoin, phenytoin  medicines to increase blood counts like filgrastim, pegfilgrastim, sargramostim  other chemotherapy drugs like cisplatin, L-asparaginase, methotrexate, mitomycin, paclitaxel  pegaspargase  vaccines  zalcitabine, ddC Talk to your doctor or health care professional before taking any of these medicines:  acetaminophen  aspirin  ibuprofen  ketoprofen  naproxen This list may not describe all possible interactions.  Give your health care provider a list of all the  medicines, herbs, non-prescription drugs, or dietary supplements you use. Also tell them if you smoke, drink alcohol, or use illegal drugs. Some items may interact with your medicine. What should I watch for while using this medicine? This drug may make you feel generally unwell. This is not uncommon, as chemotherapy can affect healthy cells as well as cancer cells. Report any side effects. Continue your course of treatment even though you feel ill unless your doctor tells you to stop. You may need blood work done while you are taking this medicine. This medicine will cause constipation. Try to have a bowel movement at least every 2 to 3 days. If you do not have a bowel movement for 3 days, call your doctor or health care professional. In some cases, you may be given additional medicines to help with side effects. Follow all directions for their use. Do not become pregnant while taking this medicine. Women should inform their doctor if they wish to become pregnant or think they might be pregnant. There is a potential for serious side effects to an unborn child. Talk to your health care professional or pharmacist for more information. Do not breast-feed an infant while taking this medicine. This medicine may make it more difficult to get pregnant or to father a child. Talk to your healthcare professional if you are concerned about your fertility. What side effects may I notice from receiving this medicine? Side effects that you should report to your doctor or health care professional as soon as possible:  allergic reactions like skin rash, itching or hives, swelling of the face, lips, or tongue  breathing problems  confusion or changes in emotions or moods  constipation  cough  mouth sores  muscle weakness  nausea and vomiting  pain, swelling, redness or irritation at the injection site  pain, tingling, numbness in the hands or feet  problems with balance, talking,  walking  seizures  stomach pain  trouble passing urine or change in the amount of urine Side effects that usually do not require medical attention (report to your doctor or health care professional if they continue or are bothersome):  diarrhea  hair loss  jaw pain  loss of appetite This list may not describe all possible side effects. Call your doctor for medical advice about side effects. You may report side effects to FDA at 1-800-FDA-1088. Where should I keep my medicine? This drug is given in a hospital or clinic and will not be stored at home. NOTE: This sheet is a summary. It may not cover all possible information. If you have questions about this medicine, talk to your doctor, pharmacist, or health care provider.  2020 Elsevier/Gold Standard (2018-01-11 08:55:02) Rituximab injection What is this medicine? RITUXIMAB (ri TUX i mab) is a monoclonal antibody. It is used to treat certain types of cancer like non-Hodgkin lymphoma and chronic lymphocytic leukemia. It is also used to treat rheumatoid arthritis, granulomatosis with polyangiitis (or Wegener's granulomatosis), microscopic polyangiitis, and pemphigus vulgaris. This medicine may be used for other purposes; ask your health care provider or pharmacist if you have questions. COMMON BRAND NAME(S): Rituxan, RUXIENCE What should I tell my health care provider before I take this medicine? They need to know if you have any of these conditions:  heart disease  infection (especially a virus infection such as hepatitis B, chickenpox, cold sores, or herpes)  immune system problems  irregular heartbeat  kidney disease  low blood counts, like low white cell, platelet, or red cell counts  lung or breathing disease, like asthma  recently received or scheduled to receive a vaccine  an unusual or allergic reaction to rituximab, other medicines, foods, dyes, or preservatives  pregnant or trying to get  pregnant  breast-feeding How should I use this medicine? This medicine is for infusion into a vein. It is administered in a hospital or clinic by a specially trained health care professional. A special MedGuide will be given to you by the pharmacist with each prescription and refill. Be sure to read this information carefully each time. Talk to your pediatrician regarding the use of this medicine in children. This medicine is not approved for use in children. Overdosage: If you think you have taken too much of this medicine contact a poison control center or emergency room at once. NOTE: This medicine is only for you. Do not share this medicine with others. What if I miss a dose? It is important not to miss a dose. Call your doctor or health care professional if you are unable to keep an appointment. What may interact with this medicine?  cisplatin  live virus vaccines This list may not describe all possible interactions. Give your health care provider a list of all the medicines, herbs, non-prescription drugs, or dietary supplements you use. Also tell them if you smoke, drink alcohol, or use illegal drugs. Some items may interact with your medicine. What should I watch for while using this medicine? Your condition will be monitored carefully while you are receiving this medicine. You may need blood work done while you are taking this medicine. This medicine can cause serious allergic reactions. To reduce your risk you may need to take medicine before treatment with this medicine. Take your medicine as directed. In some patients, this medicine may cause a serious brain infection that may cause death. If you have any problems seeing, thinking, speaking, walking, or standing, tell your healthcare professional right away. If you cannot reach your healthcare professional, urgently seek other source of medical care. Call your doctor or health care professional for advice if you get a fever, chills or  sore throat, or other symptoms of a cold or flu. Do not treat yourself. This drug decreases your body's ability to fight infections. Try to avoid being around people who are sick. Do not become pregnant while taking this medicine or for at least 12 months after stopping it. Women should inform their doctor if they wish to become pregnant or think they might be pregnant. There is a potential for serious side effects to an unborn child. Talk to your health care professional or pharmacist for more information. Do not breast-feed an infant while taking this medicine or for at least 6 months after stopping it. What side effects may I notice from receiving this medicine? Side effects that you should report to your doctor or health care professional as soon as possible:  allergic reactions like skin rash, itching or hives; swelling of the face, lips, or tongue  breathing problems  chest pain  changes in vision  diarrhea  headache with fever, neck stiffness, sensitivity to light, nausea, or confusion  fast, irregular heartbeat  loss of memory  low blood counts - this medicine may decrease the number of white blood cells, red blood cells and platelets. You may be at increased risk for infections and bleeding.  mouth sores  problems with balance, talking, or walking  redness, blistering, peeling or  loosening of the skin, including inside the mouth  signs of infection - fever or chills, cough, sore throat, pain or difficulty passing urine  signs and symptoms of kidney injury like trouble passing urine or change in the amount of urine  signs and symptoms of liver injury like dark yellow or brown urine; general ill feeling or flu-like symptoms; light-colored stools; loss of appetite; nausea; right upper belly pain; unusually weak or tired; yellowing of the eyes or skin  signs and symptoms of low blood pressure like dizziness; feeling faint or lightheaded, falls; unusually weak or  tired  stomach pain  swelling of the ankles, feet, hands  unusual bleeding or bruising  vomiting Side effects that usually do not require medical attention (report to your doctor or health care professional if they continue or are bothersome):  headache  joint pain  muscle cramps or muscle pain  nausea  tiredness This list may not describe all possible side effects. Call your doctor for medical advice about side effects. You may report side effects to FDA at 1-800-FDA-1088. Where should I keep my medicine? This drug is given in a hospital or clinic and will not be stored at home. NOTE: This sheet is a summary. It may not cover all possible information. If you have questions about this medicine, talk to your doctor, pharmacist, or health care provider.  2020 Elsevier/Gold Standard (2018-05-22 22:01:36) Cyclophosphamide Injection What is this medicine? CYCLOPHOSPHAMIDE (sye kloe FOSS fa mide) is a chemotherapy drug. It slows the growth of cancer cells. This medicine is used to treat many types of cancer like lymphoma, myeloma, leukemia, breast cancer, and ovarian cancer, to name a few. This medicine may be used for other purposes; ask your health care provider or pharmacist if you have questions. COMMON BRAND NAME(S): Cytoxan, Neosar What should I tell my health care provider before I take this medicine? They need to know if you have any of these conditions:  heart disease  history of irregular heartbeat  infection  kidney disease  liver disease  low blood counts, like white cells, platelets, or red blood cells  on hemodialysis  recent or ongoing radiation therapy  scarring or thickening of the lungs  trouble passing urine  an unusual or allergic reaction to cyclophosphamide, other medicines, foods, dyes, or preservatives  pregnant or trying to get pregnant  breast-feeding How should I use this medicine? This drug is usually given as an injection into a vein  or muscle or by infusion into a vein. It is administered in a hospital or clinic by a specially trained health care professional. Talk to your pediatrician regarding the use of this medicine in children. Special care may be needed. Overdosage: If you think you have taken too much of this medicine contact a poison control center or emergency room at once. NOTE: This medicine is only for you. Do not share this medicine with others. What if I miss a dose? It is important not to miss your dose. Call your doctor or health care professional if you are unable to keep an appointment. What may interact with this medicine?  amphotericin B  azathioprine  certain antivirals for HIV or hepatitis  certain medicines for blood pressure, heart disease, irregular heart beat  certain medicines that treat or prevent blood clots like warfarin  certain other medicines for cancer  cyclosporine  etanercept  indomethacin  medicines that relax muscles for surgery  medicines to increase blood counts  metronidazole This list may not describe  all possible interactions. Give your health care provider a list of all the medicines, herbs, non-prescription drugs, or dietary supplements you use. Also tell them if you smoke, drink alcohol, or use illegal drugs. Some items may interact with your medicine. What should I watch for while using this medicine? Your condition will be monitored carefully while you are receiving this medicine. You may need blood work done while you are taking this medicine. Drink water or other fluids as directed. Urinate often, even at night. Some products may contain alcohol. Ask your health care professional if this medicine contains alcohol. Be sure to tell all health care professionals you are taking this medicine. Certain medicines, like metronidazole and disulfiram, can cause an unpleasant reaction when taken with alcohol. The reaction includes flushing, headache, nausea, vomiting,  sweating, and increased thirst. The reaction can last from 30 minutes to several hours. Do not become pregnant while taking this medicine or for 1 year after stopping it. Women should inform their health care professional if they wish to become pregnant or think they might be pregnant. Men should not father a child while taking this medicine and for 4 months after stopping it. There is potential for serious side effects to an unborn child. Talk to your health care professional for more information. Do not breast-feed an infant while taking this medicine or for 1 week after stopping it. This medicine has caused ovarian failure in some women. This medicine may make it more difficult to get pregnant. Talk to your health care professional if you are concerned about your fertility. This medicine has caused decreased sperm counts in some men. This may make it more difficult to father a child. Talk to your health care professional if you are concerned about your fertility. Call your health care professional for advice if you get a fever, chills, or sore throat, or other symptoms of a cold or flu. Do not treat yourself. This medicine decreases your body's ability to fight infections. Try to avoid being around people who are sick. Avoid taking medicines that contain aspirin, acetaminophen, ibuprofen, naproxen, or ketoprofen unless instructed by your health care professional. These medicines may hide a fever. Talk to your health care professional about your risk of cancer. You may be more at risk for certain types of cancer if you take this medicine. If you are going to need surgery or other procedure, tell your health care professional that you are using this medicine. Be careful brushing or flossing your teeth or using a toothpick because you may get an infection or bleed more easily. If you have any dental work done, tell your dentist you are receiving this medicine. What side effects may I notice from receiving  this medicine? Side effects that you should report to your doctor or health care professional as soon as possible:  allergic reactions like skin rash, itching or hives, swelling of the face, lips, or tongue  breathing problems  nausea, vomiting  signs and symptoms of bleeding such as bloody or black, tarry stools; red or dark brown urine; spitting up blood or brown material that looks like coffee grounds; red spots on the skin; unusual bruising or bleeding from the eyes, gums, or nose  signs and symptoms of heart failure like fast, irregular heartbeat, sudden weight gain; swelling of the ankles, feet, hands  signs and symptoms of infection like fever; chills; cough; sore throat; pain or trouble passing urine  signs and symptoms of kidney injury like trouble passing urine or  change in the amount of urine  signs and symptoms of liver injury like dark yellow or brown urine; general ill feeling or flu-like symptoms; light-colored stools; loss of appetite; nausea; right upper belly pain; unusually weak or tired; yellowing of the eyes or skin Side effects that usually do not require medical attention (report to your doctor or health care professional if they continue or are bothersome):  confusion  decreased hearing  diarrhea  facial flushing  hair loss  headache  loss of appetite  missed menstrual periods  signs and symptoms of low red blood cells or anemia such as unusually weak or tired; feeling faint or lightheaded; falls  skin discoloration This list may not describe all possible side effects. Call your doctor for medical advice about side effects. You may report side effects to FDA at 1-800-FDA-1088. Where should I keep my medicine? This drug is given in a hospital or clinic and will not be stored at home. NOTE: This sheet is a summary. It may not cover all possible information. If you have questions about this medicine, talk to your doctor, pharmacist, or health care  provider.  2020 Elsevier/Gold Standard (2019-01-13 09:53:29) Doxorubicin injection What is this medicine? DOXORUBICIN (dox oh ROO bi sin) is a chemotherapy drug. It is used to treat many kinds of cancer like leukemia, lymphoma, neuroblastoma, sarcoma, and Wilms' tumor. It is also used to treat bladder cancer, breast cancer, lung cancer, ovarian cancer, stomach cancer, and thyroid cancer. This medicine may be used for other purposes; ask your health care provider or pharmacist if you have questions. COMMON BRAND NAME(S): Adriamycin, Adriamycin PFS, Adriamycin RDF, Rubex What should I tell my health care provider before I take this medicine? They need to know if you have any of these conditions:  heart disease  history of low blood counts caused by a medicine  liver disease  recent or ongoing radiation therapy  an unusual or allergic reaction to doxorubicin, other chemotherapy agents, other medicines, foods, dyes, or preservatives  pregnant or trying to get pregnant  breast-feeding How should I use this medicine? This drug is given as an infusion into a vein. It is administered in a hospital or clinic by a specially trained health care professional. If you have pain, swelling, burning or any unusual feeling around the site of your injection, tell your health care professional right away. Talk to your pediatrician regarding the use of this medicine in children. Special care may be needed. Overdosage: If you think you have taken too much of this medicine contact a poison control center or emergency room at once. NOTE: This medicine is only for you. Do not share this medicine with others. What if I miss a dose? It is important not to miss your dose. Call your doctor or health care professional if you are unable to keep an appointment. What may interact with this medicine? This medicine may interact with the following medications:  6-mercaptopurine  paclitaxel  phenytoin  [facility] John's  Wort  trastuzumab  verapamil This list may not describe all possible interactions. Give your health care provider a list of all the medicines, herbs, non-prescription drugs, or dietary supplements you use. Also tell them if you smoke, drink alcohol, or use illegal drugs. Some items may interact with your medicine. What should I watch for while using this medicine? This drug may make you feel generally unwell. This is not uncommon, as chemotherapy can affect healthy cells as well as cancer cells. Report any side effects.  Continue your course of treatment even though you feel ill unless your doctor tells you to stop. There is a maximum amount of this medicine you should receive throughout your life. The amount depends on the medical condition being treated and your overall health. Your doctor will watch how much of this medicine you receive in your lifetime. Tell your doctor if you have taken this medicine before. You may need blood work done while you are taking this medicine. Your urine may turn red for a few days after your dose. This is not blood. If your urine is dark or brown, call your doctor. In some cases, you may be given additional medicines to help with side effects. Follow all directions for their use. Call your doctor or health care professional for advice if you get a fever, chills or sore throat, or other symptoms of a cold or flu. Do not treat yourself. This drug decreases your body's ability to fight infections. Try to avoid being around people who are sick. This medicine may increase your risk to bruise or bleed. Call your doctor or health care professional if you notice any unusual bleeding. Talk to your doctor about your risk of cancer. You may be more at risk for certain types of cancers if you take this medicine. Do not become pregnant while taking this medicine or for 6 months after stopping it. Women should inform their doctor if they wish to become pregnant or think they might  be pregnant. Men should not father a child while taking this medicine and for 6 months after stopping it. There is a potential for serious side effects to an unborn child. Talk to your health care professional or pharmacist for more information. Do not breast-feed an infant while taking this medicine. This medicine has caused ovarian failure in some women and reduced sperm counts in some men This medicine may interfere with the ability to have a child. Talk with your doctor or health care professional if you are concerned about your fertility. This medicine may cause a decrease in Co-Enzyme Q-10. You should make sure that you get enough Co-Enzyme Q-10 while you are taking this medicine. Discuss the foods you eat and the vitamins you take with your health care professional. What side effects may I notice from receiving this medicine? Side effects that you should report to your doctor or health care professional as soon as possible:  allergic reactions like skin rash, itching or hives, swelling of the face, lips, or tongue  breathing problems  chest pain  fast or irregular heartbeat  low blood counts - this medicine may decrease the number of white blood cells, red blood cells and platelets. You may be at increased risk for infections and bleeding.  pain, redness, or irritation at site where injected  signs of infection - fever or chills, cough, sore throat, pain or difficulty passing urine  signs of decreased platelets or bleeding - bruising, pinpoint red spots on the skin, black, tarry stools, blood in the urine  swelling of the ankles, feet, hands  tiredness  weakness Side effects that usually do not require medical attention (report to your doctor or health care professional if they continue or are bothersome):  diarrhea  hair loss  mouth sores  nail discoloration or damage  nausea  red colored urine  vomiting This list may not describe all possible side effects. Call your  doctor for medical advice about side effects. You may report side effects to FDA at 1-800-FDA-1088.  Where should I keep my medicine? This drug is given in a hospital or clinic and will not be stored at home. NOTE: This sheet is a summary. It may not cover all possible information. If you have questions about this medicine, talk to your doctor, pharmacist, or health care provider.  2020 Elsevier/Gold Standard (2016-11-22 11:01:26)

## 2019-10-11 ENCOUNTER — Inpatient Hospital Stay: Payer: Medicare Other

## 2019-10-11 VITALS — BP 120/59 | HR 87 | Temp 98.9°F | Resp 18

## 2019-10-11 DIAGNOSIS — Z5112 Encounter for antineoplastic immunotherapy: Secondary | ICD-10-CM | POA: Diagnosis not present

## 2019-10-11 DIAGNOSIS — C8339 Diffuse large B-cell lymphoma, extranodal and solid organ sites: Secondary | ICD-10-CM

## 2019-10-11 MED ORDER — PEGFILGRASTIM-CBQV 6 MG/0.6ML ~~LOC~~ SOSY
6.0000 mg | PREFILLED_SYRINGE | Freq: Once | SUBCUTANEOUS | Status: AC
  Administered 2019-10-11: 6 mg via SUBCUTANEOUS

## 2019-10-11 MED ORDER — PEGFILGRASTIM-CBQV 6 MG/0.6ML ~~LOC~~ SOSY
PREFILLED_SYRINGE | SUBCUTANEOUS | Status: AC
  Filled 2019-10-11: qty 0.6

## 2019-10-13 ENCOUNTER — Other Ambulatory Visit: Payer: Self-pay | Admitting: *Deleted

## 2019-10-13 DIAGNOSIS — C8339 Diffuse large B-cell lymphoma, extranodal and solid organ sites: Secondary | ICD-10-CM

## 2019-10-15 ENCOUNTER — Telehealth: Payer: Self-pay | Admitting: *Deleted

## 2019-10-15 ENCOUNTER — Telehealth: Payer: Self-pay | Admitting: Hematology

## 2019-10-15 NOTE — Telephone Encounter (Signed)
Scheduled appt per 6/21 sch message - pt is aware of appts

## 2019-10-20 ENCOUNTER — Other Ambulatory Visit: Payer: Self-pay

## 2019-10-20 ENCOUNTER — Inpatient Hospital Stay: Payer: Medicare Other

## 2019-10-20 ENCOUNTER — Inpatient Hospital Stay: Payer: Medicare Other | Admitting: Hematology

## 2019-10-20 VITALS — BP 103/76 | HR 122 | Temp 97.9°F | Resp 18 | Ht 70.5 in | Wt 211.7 lb

## 2019-10-20 DIAGNOSIS — C8339 Diffuse large B-cell lymphoma, extranodal and solid organ sites: Secondary | ICD-10-CM | POA: Diagnosis not present

## 2019-10-20 DIAGNOSIS — Z5111 Encounter for antineoplastic chemotherapy: Secondary | ICD-10-CM

## 2019-10-20 DIAGNOSIS — C83398 Diffuse large b-cell lymphoma of other extranodal and solid organ sites: Secondary | ICD-10-CM

## 2019-10-20 DIAGNOSIS — Z5112 Encounter for antineoplastic immunotherapy: Secondary | ICD-10-CM | POA: Diagnosis not present

## 2019-10-20 LAB — CBC WITH DIFFERENTIAL/PLATELET
Abs Immature Granulocytes: 0.07 10*3/uL (ref 0.00–0.07)
Basophils Absolute: 0 10*3/uL (ref 0.0–0.1)
Basophils Relative: 1 %
Eosinophils Absolute: 0.1 10*3/uL (ref 0.0–0.5)
Eosinophils Relative: 2 %
HCT: 32.9 % — ABNORMAL LOW (ref 39.0–52.0)
Hemoglobin: 10.8 g/dL — ABNORMAL LOW (ref 13.0–17.0)
Immature Granulocytes: 2 %
Lymphocytes Relative: 39 %
Lymphs Abs: 1.2 10*3/uL (ref 0.7–4.0)
MCH: 29.4 pg (ref 26.0–34.0)
MCHC: 32.8 g/dL (ref 30.0–36.0)
MCV: 89.6 fL (ref 80.0–100.0)
Monocytes Absolute: 0.3 10*3/uL (ref 0.1–1.0)
Monocytes Relative: 9 %
Neutro Abs: 1.4 10*3/uL — ABNORMAL LOW (ref 1.7–7.7)
Neutrophils Relative %: 47 %
Platelets: 181 10*3/uL (ref 150–400)
RBC: 3.67 MIL/uL — ABNORMAL LOW (ref 4.22–5.81)
RDW: 12.8 % (ref 11.5–15.5)
WBC: 3 10*3/uL — ABNORMAL LOW (ref 4.0–10.5)
nRBC: 0 % (ref 0.0–0.2)

## 2019-10-20 LAB — CMP (CANCER CENTER ONLY)
ALT: 26 U/L (ref 0–44)
AST: 16 U/L (ref 15–41)
Albumin: 3.7 g/dL (ref 3.5–5.0)
Alkaline Phosphatase: 81 U/L (ref 38–126)
Anion gap: 11 (ref 5–15)
BUN: 28 mg/dL — ABNORMAL HIGH (ref 8–23)
CO2: 25 mmol/L (ref 22–32)
Calcium: 9.5 mg/dL (ref 8.9–10.3)
Chloride: 103 mmol/L (ref 98–111)
Creatinine: 1.61 mg/dL — ABNORMAL HIGH (ref 0.61–1.24)
GFR, Est AFR Am: 49 mL/min — ABNORMAL LOW (ref >60)
GFR, Estimated: 43 mL/min — ABNORMAL LOW (ref >60)
Glucose, Bld: 250 mg/dL — ABNORMAL HIGH (ref 70–99)
Potassium: 4.6 mmol/L (ref 3.5–5.1)
Sodium: 139 mmol/L (ref 135–145)
Total Bilirubin: <0.2 mg/dL — ABNORMAL LOW (ref 0.3–1.2)
Total Protein: 6.9 g/dL (ref 6.5–8.1)

## 2019-10-20 LAB — MAGNESIUM: Magnesium: 2 mg/dL (ref 1.7–2.4)

## 2019-10-20 LAB — SAMPLE TO BLOOD BANK

## 2019-10-20 LAB — LACTATE DEHYDROGENASE: LDH: 116 U/L (ref 98–192)

## 2019-10-20 NOTE — Progress Notes (Signed)
HEMATOLOGY/ONCOLOGY CONSULTATION NOTE  Date of Service: 10/20/2019  Patient Care Team: [name], L.[name], MD as PCP - General (Family Medicine)  CHIEF COMPLAINTS/PURPOSE OF CONSULTATION:  DLBCL  HISTORY OF PRESENTING ILLNESS:   Patrick Bautista is a wonderful 69 y.o. male who has been referred to [place] by Dr. 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 Pt is accompanied today by his girlfriend, Patrick Bautista. The pt reports that he is doing well overall.    Pt received his second COVID19 vaccine on 02/13. Soon after he began having sweating in his groin, numbness in his left thigh, and numbness in two fingers on his left hand. After the symptoms began pt went to see his PCP who thought he had a hydrocele. He returned a month later, with continued symptoms, and received a testicular US which found a mass. Pt had a left Orchiectomy performed on 09/03/2019. The pt reports that he saw Dr. Louis Meckel this morning and has been healing well from his surgery.   His Diabetes has been relatively well-controlled with diet and Glimepiride. It has been many years since he has had concerns with Diverticulitis and last had kidney stones in 2017. He has previously been seen by a Nephrologist who felt that his kidney disease could have been partially caused by Cystoscopy with Dr. Louis Meckel in 2018. Pt has no history of lung or heart dysfunction or neuropathy.   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   Of note prior to the patient's visit today, pt has had Left Testicle and Spermatic Cord Surgical Pathology ([id number]) completed on 09/03/2019 with results revealing "Diffuse large B-cell lymphoma."   Pt has had CT C/A/P completed on 08/26/2019 with results revealing "1. Large left-sided testicular mass consistent with known testicular cancer. 2. No findings for metastatic disease involving the chest, abdomen, or pelvis. 3. A few small scattered  retroperitoneal lymph nodes but no mass or overt adenopathy. "  Most recent lab results (09/01/2019) of CBC w/diff and CMP is as follows: all values are WNL except for Hgb at 12.7, Glucose at 177, BUN at 50, Creatinine at 1.91, GFR Est Non Af Am at 41.  On review of systems, pt reports anxiety, stress and denies neuropathy, right testicular pain/swelling, abdominal pain and any other symptoms.   On PMHx the pt reports HTN, Diabetes Type II, Diverticulitis, Arthritis, Lumbar herniated disc, Left Orchiectomy. On Social Hx the pt reports that he is a retired Agricultural consultant.   INTERVAL HISTORY:   Patrick Bautista is a wonderful 69 y.o. 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 He is here for a toxicity check after C1 R-CHOP. The patient's last visit with [place] was on 10/08/2019. The pt reports that he is doing well overall.  The pt reports that he was having severe constipation directly after his first treatment, which resolved with Colace. Pt denies any nausea, vomiting, or fever at this time. He has noticed some increased fatigue, ear drainage, reflux. He has been eating well and hydrating well. Pt has not been taking Celexa, but has been using Lorazepam to help him get sleep at night.   Lab results today (10/20/19) of CBC w/diff and CMP is as follows: all values are WNL except for WBC at 3.0K, RBC at 3.67, Hgb at 10.8, HCT at 32.9, Glucose at 250, BUN at 28, Creatinine at 1.61, Total Bilirubin at <0.2, GFR Est Non Af Am at 43. 10/20/2019  LDH at 116 10/20/2019 Magnesium at 2.0  On review of systems, pt reports fatigue, ear drainage, acid reflux and denies constipation, N/V/D, fever, sleeplessness, mouth sores and any other symptoms.    MEDICAL HISTORY:  Past Medical History:  Diagnosis Date  . Arthritis   . Diabetes mellitus without complication ([place])    type 2  . Diverticulitis 15 yrs ago  . History of kidney stones    right ureteral stone and one in  2017  . Hypertension   . Lumbar herniated disc    L 4 L 5     SURGICAL HISTORY: Past Surgical History:  Procedure Laterality Date  . colonscopy  15 yrs ago  . CYSTOSCOPY/URETEROSCOPY/HOLMIUM LASER/STENT PLACEMENT Right 02/16/2017   Procedure: CYSTOSCOPY/RETROGRADE/URETEROSCOPY/HOLMIUM LASER/STENT PLACEMENT;  Surgeon: Ardis Hughs, MD;  Location: [facility];  Service: Urology;  Laterality: Right;  NEEDS 60 MIN FOR PROCEDURE  . IR IMAGING GUIDED PORT INSERTION  09/19/2019  . MOUTH SURGERY  20 yrs ago  . ORCHIECTOMY Left 09/03/2019   Procedure: LEFT ORCHIECTOMY;  Surgeon: Ardis Hughs, MD;  Location: [facility];  Service: Urology;  Laterality: Left;    SOCIAL HISTORY: Social History   Socioeconomic History  . Marital status: Legally Separated    Spouse name: Not on file  . Number of children: Not on file  . Years of education: Not on file  . Highest education level: Not on file  Occupational History  . Not on file  Tobacco Use  . Smoking status: Never Smoker  . Smokeless tobacco: Never Used  Vaping Use  . Vaping Use: Never used  Substance and Sexual Activity  . Alcohol use: No  . Drug use: No  . Sexual activity: Not on file  Other Topics Concern  . Not on file  Social History Narrative  . Not on file   Social Determinants of Health   Financial Resource Strain:   . Difficulty of Paying Living Expenses:   Food Insecurity:   . Worried About Charity fundraiser in the Last Year:   . Arboriculturist in the Last Year:   Transportation Needs:   . Film/video editor (Medical):   Marland Kitchen Lack of Transportation (Non-Medical):   Physical Activity:   . Days of Exercise per Week:   . Minutes of Exercise per Session:   Stress:   . Feeling of Stress :   Social Connections:   . Frequency of Communication with Friends and Family:   . Frequency of Social Gatherings with Friends and Family:   . Attends Religious Services:   . Active Member of Clubs or Organizations:   .  Attends Archivist Meetings:   Marland Kitchen Marital Status:   Intimate Partner Violence:   . Fear of Current or Ex-Partner:   . Emotionally Abused:   Marland Kitchen Physically Abused:   . Sexually Abused:     FAMILY HISTORY: No family history on file.  ALLERGIES:  is allergic to hydrocodone, percocet [oxycodone-acetaminophen], codeine, penicillins, and tape.  MEDICATIONS:  Current Outpatient Medications  Medication Sig Dispense Refill  . acetaminophen (TYLENOL) 500 MG tablet Take 500-1,000 mg by mouth every 6 (six) hours as needed (pain.).     Marland Kitchen ALPRAZolam (XANAX) 0.25 MG tablet Take 0.25 mg by mouth at bedtime as needed for sleep.    Marland Kitchen amLODipine (NORVASC) 2.5 MG tablet Take 2.5 mg by mouth daily. 2.5 mg + 5 mg =7.5 mg total daily dose    . amLODipine (NORVASC)  5 MG tablet Take 5 mg by mouth daily. 2.5 mg + 5 mg =7.5 mg total daily dose  11  . citalopram (CELEXA) 10 MG tablet TAKE 1 TABLET BY MOUTH EVERY DAY 90 tablet 1  . ergocalciferol (VITAMIN D2) 1.25 MG (50000 UT) capsule Take 1 capsule (50,000 Units total) by mouth 2 (two) times a week. 24 capsule 2  . fluocinonide cream (LIDEX) 4.33 % Apply 1 application topically 2 (two) times daily as needed (skin irritation.).    Marland Kitchen glimepiride (AMARYL) 1 MG tablet Take 1 mg by mouth daily.  12  . HUMALOG KWIKPEN 100 UNIT/ML KwikPen     . lidocaine-prilocaine (EMLA) cream Apply to affected area once 30 g 3  . LORazepam (ATIVAN) 0.5 MG tablet Take 1 tablet (0.5 mg total) by mouth every 6 (six) hours as needed (Nausea or vomiting). 30 tablet 0  . olmesartan-hydrochlorothiazide (BENICAR HCT) 40-25 MG tablet Take 1 tablet by mouth daily.    . ondansetron (ZOFRAN) 8 MG tablet Take 1 tablet (8 mg total) by mouth 2 (two) times daily as needed for refractory nausea / vomiting. Start on day 3 after cyclophosphamide chemotherapy. 30 tablet 1  . predniSONE (DELTASONE) 20 MG tablet Take 3 tablets (60 mg total) by mouth daily with breakfast. Take with food on days 1-5  of chemotherapy. 15 tablet 5  . prochlorperazine (COMPAZINE) 10 MG tablet Take 1 tablet (10 mg total) by mouth every 6 (six) hours as needed (Nausea or vomiting). 30 tablet 6  . traMADol (ULTRAM) 50 MG tablet Take 1-2 tablets (50-100 mg total) by mouth every 6 (six) hours as needed for moderate pain. 15 tablet 0  . TRESIBA FLEXTOUCH 100 UNIT/ML FlexTouch Pen      No current facility-administered medications for this visit.    REVIEW OF SYSTEMS:   A 10+ POINT REVIEW OF SYSTEMS WAS OBTAINED including neurology, dermatology, psychiatry, cardiac, respiratory, lymph, extremities, GI, GU, Musculoskeletal, constitutional, breasts, reproductive, HEENT.  All pertinent positives are noted in the HPI.  All others are negative.   PHYSICAL EXAMINATION: ECOG PERFORMANCE STATUS: 0 - Asymptomatic  . Vitals:   10/20/19 1528  BP: 103/76  Pulse: (!) 122  Resp: 18  Temp: 97.9 F (36.6 C)  SpO2: 100%   Filed Weights   10/20/19 1528  Weight: 211 lb 11.2 oz (96 kg)   .Body mass index is 29.95 kg/m.   GENERAL:alert, in no acute distress and comfortable SKIN: no acute rashes, no significant lesions EYES: conjunctiva are pink and non-injected, sclera anicteric OROPHARYNX: MMM, no exudates, no oropharyngeal erythema or ulceration NECK: supple, no JVD LYMPH:  no palpable lymphadenopathy in the cervical, axillary or inguinal regions LUNGS: clear to auscultation b/l with normal respiratory effort HEART: regular rate & rhythm ABDOMEN:  normoactive bowel sounds , non tender, not distended. No palpable hepatosplenomegaly.  Extremity: no pedal edema PSYCH: alert & oriented x 3 with fluent speech NEURO: no focal motor/sensory deficits  LABORATORY DATA:  I have reviewed the data as listed  . CBC Latest Ref Rng & Units 10/20/2019 10/08/2019 09/16/2019  WBC 4.0 - 10.5 K/uL 3.0(L) 9.8 9.5  Hemoglobin 13.0 - 17.0 g/dL 10.8(L) 12.3(L) 12.0(L)  Hematocrit 39 - 52 % 32.9(L) 37.7(L) 36.5(L)  Platelets 150 - 400  K/uL 181 239 255    . CMP Latest Ref Rng & Units 10/20/2019 10/08/2019 09/16/2019  Glucose 70 - 99 mg/dL 250(H) 188(H) 177(H)  BUN 8 - 23 mg/dL 28(H) 32(H) 35(H)  Creatinine 0.61 -  1.24 mg/dL 1.61(H) 1.74(H) 1.87(H)  Sodium 135 - 145 mmol/L 139 138 140  Potassium 3.5 - 5.1 mmol/L 4.6 5.4(H) 4.5  Chloride 98 - 111 mmol/L 103 107 106  CO2 22 - 32 mmol/L 25 23 24   Calcium 8.9 - 10.3 mg/dL 9.5 10.3 10.0  Total Protein 6.5 - 8.1 g/dL 6.9 8.0 7.9  Total Bilirubin 0.3 - 1.2 mg/dL <0.2(L) 0.5 <0.2(L)  Alkaline Phos 38 - 126 U/L 81 85 105  AST 15 - 41 U/L 16 18 20   ALT 0 - 44 U/L 26 20 38   09/03/2019 Left Testicle and Spermatic Cord Surgical Pathology ([id number]) :    RADIOGRAPHIC STUDIES: I have personally reviewed the radiological images as listed and agreed with the findings in the report. NM PET Image Initial (PI) Skull Base To Thigh  Result Date: 10/01/2019 CLINICAL DATA:  Initial treatment strategy for testicular large B-cell lymphoma. EXAM: NUCLEAR MEDICINE PET SKULL BASE TO THIGH TECHNIQUE: 12.3 mCi F-18 FDG was injected intravenously. Full-ring PET imaging was performed from the skull base to thigh after the radiotracer. CT data was obtained and used for attenuation correction and anatomic localization. Fasting blood glucose: 127 mg/dl COMPARISON:  CT CAP 08/26/2019 FINDINGS: Mediastinal blood pool activity: SUV max 3.8 Liver activity: SUV max 5.47 NECK: No hypermetabolic lymph nodes in the neck. Incidental CT findings: none CHEST: No hypermetabolic mediastinal or hilar nodes. No suspicious pulmonary nodules on the CT scan. Incidental CT findings: Mild aortic atherosclerosis. RCA and LAD coronary artery calcifications. No pericardial effusion. ABDOMEN/PELVIS: There is no abnormal areas of increased tracer activity within the liver, pancreas, and spleen. Right adrenal gland nodule appears FDG avid measuring 1.1 x 0.8 cm within SUV max of 13.03, image 121/4. No abnormal uptake within the  left adrenal gland. No FDG avid or enlarged abdominal lymph nodes. Left external iliac lymph node measures 0.9 cm and has an SUV max of 2.59, image 184/4. Possibly reactive. Incidental CT findings: Aortic atherosclerosis. No aneurysm. Postoperative changes from left orchiectomy. Asymmetric increased radiotracer uptake within the left inguinal canal is identified with SUV max of 5.45. Favor postsurgical change. SKELETON: No focal hypermetabolic activity to suggest skeletal metastasis. Incidental CT findings: none IMPRESSION: 1. Small right adrenal nodule exhibits intense tracer uptake with SUV max of 13.03. Compared with the study from 08/26/2019 this appears to represent a new finding. This nodule has mean Hounsfield units of 31.5 compared with mean Hounsfield units of 1.44 on 08/26/2019. Cannot rule out right adrenal gland involvement. 2. No FDG avid (Deauville criteria 3 or greater) lymph nodes or mass identified within the remainder of the neck, chest, abdomen or pelvis. 3. Aortic atherosclerosis, in addition to RCA and LAD coronary artery disease. Please note that although the presence of coronary artery calcium documents the presence of coronary artery disease, the severity of this disease and any potential stenosis cannot be assessed on this non-gated CT examination. Assessment for potential risk factor modification, dietary therapy or pharmacologic therapy may be warranted, if clinically indicated. Aortic Atherosclerosis (ICD10-I70.0). Electronically Signed   By: Kerby Moors M.D.   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 Large left-sided testicular mass consistent with known testicular cancer. 2. No findings for metastatic disease involving the chest, abdomen, or  pelvis. 3.  A few small scattered retroperitoneal lymph nodes but no mass or overt adenopathy. " 09/30/2019 PET/CT ([id number]) revealed "1. Small right adrenal nodule exhibits intense tracer uptake with SUV max of 13.03. Compared with the study from 08/26/2019 this appears to represent a new finding. This nodule has mean Hounsfield units of 31.5 compared with mean Hounsfield units of 1.44 on 08/26/2019. Cannot rule out right adrenal gland involvement. 2. No FDG avid (Deauville criteria 3 or greater) lymph nodes or mass identified within the remainder of the neck, chest, abdomen or pelvis. 3. Aortic atherosclerosis, in addition to RCA and LAD coronary artery disease. Please note that although the presence of coronary artery calcium documents the presence of coronary artery disease, the severity of this disease and any potential stenosis cannot be assessed on this non-gated CT examination. Assessment for potential risk factor modification, dietary therapy or pharmacologic therapy may be warranted, if clinically indicated. Aortic Atherosclerosis (ICD10-I70.0)." 2) .Marland Kitchen Past Medical History:  Diagnosis Date  . Arthritis   . Diabetes mellitus without complication ([place])    type 2  . Diverticulitis 15 yrs ago  . History of kidney stones    right ureteral stone and one in 2017  . Hypertension   . Lumbar herniated disc    L 4 L 5   3) CKD stage 3 PLAN: -Discussed pt labwork today, 10/20/19; mild anemia, slight leukopenia, PLT are nml, elevated Glucose level, blood chemistries are stable, LDH and Magnesium is WNL -Recommended that the pt continue to eat well, drink at least 48-64 oz of water each day, and walk 20-30 minutes each day.  -Recommend pt use salt/baking soda mouthwash 3-4 times per day to prevent mouth soreness -Recommend steam inhaler with distilled water 1-2 times per day for sinus symptoms -The pt has no prohibitive toxicities from continuing C2 R-CHOP with G-CSF support at this time. -Advised  pt that we plan to complete up to 6 cycles of chemotherapy.  -Plan to begin IT MX for CNS prophylaxis with C3  -Continue 50K UT Vitamin D twice per week -Rx stool softener w/Senna, acid suppressant -Will see back in with C2D1   FOLLOW UP: Plz schedule C2 of RCHOP with udenyca as ordered. Portflush, labs and MD visit with C2D1 of treatment.   The total time spent in the appt was 20 minutes and more than 50% was on counseling and direct patient cares.  All of the patient's questions were answered with apparent satisfaction. The patient knows to call the clinic with any problems, questions or concerns.    Sullivan Lone MD [place] AAHIVMS [facility] Hematology/Oncology Physician [facility]  (Office):       [phone] (Work cell):  [phone] (Fax):           [phone]  10/20/2019 4:24 PM  I, [name], am acting as a scribe for Dr. Sullivan Lone.   .I have reviewed the above documentation for accuracy and completeness, and I agree with the above. Brunetta Genera MD

## 2019-10-21 ENCOUNTER — Encounter: Payer: Self-pay | Admitting: Hematology

## 2019-10-22 ENCOUNTER — Other Ambulatory Visit: Payer: Self-pay | Admitting: Hematology

## 2019-10-22 MED ORDER — SENNOSIDES-DOCUSATE SODIUM 8.6-50 MG PO TABS
2.0000 | ORAL_TABLET | Freq: Every evening | ORAL | 1 refills | Status: DC | PRN
Start: 2019-10-22 — End: 2022-09-22

## 2019-10-22 MED ORDER — PANTOPRAZOLE SODIUM 40 MG PO TBEC
40.0000 mg | DELAYED_RELEASE_TABLET | Freq: Every day | ORAL | 1 refills | Status: DC
Start: 2019-10-22 — End: 2019-11-13

## 2019-10-22 NOTE — Progress Notes (Unsigned)
pr

## 2019-10-30 NOTE — Progress Notes (Signed)
HEMATOLOGY/ONCOLOGY CONSULTATION NOTE  Date of Service: 10/31/2019  Patient Care Team: [name], L.[name], MD as PCP - General (Family Medicine)  CHIEF COMPLAINTS/PURPOSE OF CONSULTATION:  Primary Testicular large B cell lymphoma   HISTORY OF PRESENTING ILLNESS:   Patrick Bautista is a wonderful 69 y.o. male who has been referred to [place] by Dr. 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 Pt is accompanied today by his girlfriend, [name]. The pt reports that he is doing well overall.    Pt received his second COVID19 vaccine on 02/13. Soon after he began having sweating in his groin, numbness in his left thigh, and numbness in two fingers on his left hand. After the symptoms began pt went to see his PCP who thought he had a hydrocele. He returned a month later, with continued symptoms, and received a testicular US which found a mass. Pt had a left Orchiectomy performed on 09/03/2019. The pt reports that he saw Dr. Louis Meckel this morning and has been healing well from his surgery.   His Diabetes has been relatively well-controlled with diet and Glimepiride. It has been many years since he has had concerns with Diverticulitis and last had kidney stones in 2017. He has previously been seen by a Nephrologist who felt that his kidney disease could have been partially caused by Cystoscopy with Dr. Louis Meckel in 2018. Pt has no history of lung or heart dysfunction or neuropathy.   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   Of note prior to the patient's visit today, pt has had Left Testicle and Spermatic Cord Surgical Pathology ([id number]) completed on 09/03/2019 with results revealing "Diffuse large B-cell lymphoma."   Pt has had CT C/A/P completed on 08/26/2019 with results revealing "1. Large left-sided testicular mass consistent with known testicular cancer. 2. No findings for metastatic disease involving the chest, abdomen, or pelvis. 3.  A few small scattered retroperitoneal lymph nodes but no mass or overt adenopathy. "  Most recent lab results (09/01/2019) of CBC w/diff and CMP is as follows: all values are WNL except for Hgb at 12.7, Glucose at 177, BUN at 50, Creatinine at 1.91, GFR Est Non Af Am at 41.  On review of systems, pt reports anxiety, stress and denies neuropathy, right testicular pain/swelling, abdominal pain and any other symptoms.   On PMHx the pt reports HTN, Diabetes Type II, Diverticulitis, Arthritis, Lumbar herniated disc, Left Orchiectomy. On Social Hx the pt reports that he is a retired Agricultural consultant.   INTERVAL HISTORY:   Patrick Bautista is a wonderful 69 y.o. 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 He is here for a toxicity check after C2 R-CHOP. The patient's last visit with [place] was on 10/20/19. The pt reports that he is doing well overall.  The pt reports he is good. Pt's PCP is helping him decrease his blood sugars and continue following up about them. On an average pt needs 2-3 units with meals daily. He is still fatigued but he pushes through it. Pt gets weaker quicker and rest when he needs to and then gets back to what he was doing once he feels better. He is continuing to take fiber and stool softener to prevent constipation he previously had. Pt is also taking lorazepam to help sleep at night.   Lab results today (10/31/19) of CBC w/diff and CMP is as follows: all values are WNL except for RBC at  3.80, Hemoglobin at 11.2, HCT at 34.5, Abs Immature Granulocytes at 0.09K, Glucose at 283, BUN at 28, Creatinine at 1.52, Total Bilirubin at 0.2, GFR, Est Non Af Am at 46, GFR, Est AFR Am at 53  On review of systems, pt reports fatigue, staying hydrated, staying active and denies nausea, vomiting, diarrhea, mouth soars, back pain, problems passing urine and any other symptoms.    MEDICAL HISTORY:  Past Medical History:  Diagnosis Date  . Arthritis   .  Diabetes mellitus without complication ([place])    type 2  . Diverticulitis 15 yrs ago  . History of kidney stones    right ureteral stone and one in 2017  . Hypertension   . Lumbar herniated disc    L 4 L 5     SURGICAL HISTORY: Past Surgical History:  Procedure Laterality Date  . colonscopy  15 yrs ago  . CYSTOSCOPY/URETEROSCOPY/HOLMIUM LASER/STENT PLACEMENT Right 02/16/2017   Procedure: CYSTOSCOPY/RETROGRADE/URETEROSCOPY/HOLMIUM LASER/STENT PLACEMENT;  Surgeon: Ardis Hughs, MD;  Location: [facility];  Service: Urology;  Laterality: Right;  NEEDS 60 MIN FOR PROCEDURE  . IR IMAGING GUIDED PORT INSERTION  09/19/2019  . MOUTH SURGERY  20 yrs ago  . ORCHIECTOMY Left 09/03/2019   Procedure: LEFT ORCHIECTOMY;  Surgeon: Ardis Hughs, MD;  Location: [facility];  Service: Urology;  Laterality: Left;    SOCIAL HISTORY: Social History   Socioeconomic History  . Marital status: Legally Separated    Spouse name: Not on file  . Number of children: Not on file  . Years of education: Not on file  . Highest education level: Not on file  Occupational History  . Not on file  Tobacco Use  . Smoking status: Never Smoker  . Smokeless tobacco: Never Used  Vaping Use  . Vaping Use: Never used  Substance and Sexual Activity  . Alcohol use: No  . Drug use: No  . Sexual activity: Not on file  Other Topics Concern  . Not on file  Social History Narrative  . Not on file   Social Determinants of Health   Financial Resource Strain:   . Difficulty of Paying Living Expenses:   Food Insecurity:   . Worried About Charity fundraiser in the Last Year:   . Arboriculturist in the Last Year:   Transportation Needs:   . Film/video editor (Medical):   Marland Kitchen Lack of Transportation (Non-Medical):   Physical Activity:   . Days of Exercise per Week:   . Minutes of Exercise per Session:   Stress:   . Feeling of Stress :   Social Connections:   . Frequency of Communication with Friends and  Family:   . Frequency of Social Gatherings with Friends and Family:   . Attends Religious Services:   . Active Member of Clubs or Organizations:   . Attends Archivist Meetings:   Marland Kitchen Marital Status:   Intimate Partner Violence:   . Fear of Current or Ex-Partner:   . Emotionally Abused:   Marland Kitchen Physically Abused:   . Sexually Abused:     FAMILY HISTORY: No family history on file.  ALLERGIES:  is allergic to hydrocodone, percocet [oxycodone-acetaminophen], codeine, penicillins, and tape.  MEDICATIONS:  Current Outpatient Medications  Medication Sig Dispense Refill  . acetaminophen (TYLENOL) 500 MG tablet Take 500-1,000 mg by mouth every 6 (six) hours as needed (pain.).     Marland Kitchen ALPRAZolam (XANAX) 0.25 MG tablet Take 0.25 mg by mouth at bedtime  as needed for sleep.    Marland Kitchen amLODipine (NORVASC) 2.5 MG tablet Take 2.5 mg by mouth daily. 2.5 mg + 5 mg =7.5 mg total daily dose    . amLODipine (NORVASC) 5 MG tablet Take 5 mg by mouth daily. 2.5 mg + 5 mg =7.5 mg total daily dose  11  . citalopram (CELEXA) 10 MG tablet TAKE 1 TABLET BY MOUTH EVERY DAY 90 tablet 1  . ergocalciferol (VITAMIN D2) 1.25 MG (50000 UT) capsule Take 1 capsule (50,000 Units total) by mouth 2 (two) times a week. 24 capsule 2  . fluocinonide cream (LIDEX) 8.18 % Apply 1 application topically 2 (two) times daily as needed (skin irritation.).    Marland Kitchen glimepiride (AMARYL) 1 MG tablet Take 1 mg by mouth daily.  12  . HUMALOG KWIKPEN 100 UNIT/ML KwikPen     . lidocaine-prilocaine (EMLA) cream Apply to affected area once 30 g 3  . LORazepam (ATIVAN) 0.5 MG tablet Take 1 tablet (0.5 mg total) by mouth every 6 (six) hours as needed (Nausea or vomiting). 30 tablet 0  . olmesartan-hydrochlorothiazide (BENICAR HCT) 40-25 MG tablet Take 1 tablet by mouth daily.    . ondansetron (ZOFRAN) 8 MG tablet Take 1 tablet (8 mg total) by mouth 2 (two) times daily as needed for refractory nausea / vomiting. Start on day 3 after cyclophosphamide  chemotherapy. 30 tablet 1  . pantoprazole (PROTONIX) 40 MG tablet Take 1 tablet (40 mg total) by mouth daily before breakfast. 30 tablet 1  . predniSONE (DELTASONE) 20 MG tablet Take 3 tablets (60 mg total) by mouth daily with breakfast. Take with food on days 1-5 of chemotherapy. 15 tablet 5  . prochlorperazine (COMPAZINE) 10 MG tablet Take 1 tablet (10 mg total) by mouth every 6 (six) hours as needed (Nausea or vomiting). 30 tablet 6  . senna-docusate (SENNA S) 8.6-50 MG tablet Take 2 tablets by mouth at bedtime as needed for mild constipation or moderate constipation. 60 tablet 1  . traMADol (ULTRAM) 50 MG tablet Take 1-2 tablets (50-100 mg total) by mouth every 6 (six) hours as needed for moderate pain. 15 tablet 0  . TRESIBA FLEXTOUCH 100 UNIT/ML FlexTouch Pen      No current facility-administered medications for this visit.    REVIEW OF SYSTEMS:   A 10+ POINT REVIEW OF SYSTEMS WAS OBTAINED including neurology, dermatology, psychiatry, cardiac, respiratory, lymph, extremities, GI, GU, Musculoskeletal, constitutional, breasts, reproductive, HEENT.  All pertinent positives are noted in the HPI.  All others are negative.   PHYSICAL EXAMINATION: ECOG PERFORMANCE STATUS: 0 - Asymptomatic  . Vitals:   10/31/19 0947  BP: 115/72  Pulse: 79  Resp: 18  Temp: 97.7 F (36.5 C)  SpO2: 100%   Filed Weights   10/31/19 0947  Weight: 209 lb 12.8 oz (95.2 kg)   .Body mass index is 29.68 kg/m.   GENERAL:alert, in no acute distress and comfortable SKIN: no acute rashes, no significant lesions EYES: conjunctiva are pink and non-injected, sclera anicteric OROPHARYNX: MMM, no exudates, no oropharyngeal erythema or ulceration NECK: supple, no JVD LYMPH:  no palpable lymphadenopathy in the cervical, axillary or inguinal regions LUNGS: clear to auscultation b/l with normal respiratory effort HEART: regular rate & rhythm ABDOMEN:  normoactive bowel sounds , non tender, not distended. Extremity:  no pedal edema PSYCH: alert & oriented x 3 with fluent speech NEURO: no focal motor/sensory deficits  LABORATORY DATA:  I have reviewed the data as listed  . CBC Latest Ref  Rng & Units 10/31/2019 10/20/2019 10/08/2019  WBC 4.0 - 10.5 K/uL 8.0 3.0(L) 9.8  Hemoglobin 13.0 - 17.0 g/dL 11.2(L) 10.8(L) 12.3(L)  Hematocrit 39 - 52 % 34.5(L) 32.9(L) 37.7(L)  Platelets 150 - 400 K/uL 323 181 239    . CMP Latest Ref Rng & Units 10/31/2019 10/20/2019 10/08/2019  Glucose 70 - 99 mg/dL 283(H) 250(H) 188(H)  BUN 8 - 23 mg/dL 28(H) 28(H) 32(H)  Creatinine 0.61 - 1.24 mg/dL 1.52(H) 1.61(H) 1.74(H)  Sodium 135 - 145 mmol/L 137 139 138  Potassium 3.5 - 5.1 mmol/L 5.0 4.6 5.4(H)  Chloride 98 - 111 mmol/L 106 103 107  CO2 22 - 32 mmol/L 23 25 23   Calcium 8.9 - 10.3 mg/dL 9.8 9.5 10.3  Total Protein 6.5 - 8.1 g/dL 7.2 6.9 8.0  Total Bilirubin 0.3 - 1.2 mg/dL 0.2(L) <0.2(L) 0.5  Alkaline Phos 38 - 126 U/L 95 81 85  AST 15 - 41 U/L 19 16 18   ALT 0 - 44 U/L 24 26 20    09/03/2019 Left Testicle and Spermatic Cord Surgical Pathology ([id number]) :    RADIOGRAPHIC STUDIES: I have personally reviewed the radiological images as listed and agreed with the findings in the report. No results found.  ASSESSMENT & PLAN:   69 yo with   1) Newly diagnosed left primary testicular large B cell lymphoma 09/03/2019 Left Testicle and Spermatic Cord Surgical Pathology ([id number]) revealed "Diffuse large B-cell lymphoma."  08/26/2019 CT C/A/P revealed "1. Large left-sided testicular mass consistent with known testicular cancer. 2. No findings for metastatic disease involving the chest, abdomen, or pelvis. 3. A few small scattered retroperitoneal lymph nodes but no mass or overt adenopathy. " 09/30/2019 PET/CT ([id number]) revealed "1. Small right adrenal nodule exhibits intense tracer uptake with SUV max of 13.03. Compared with the study from 08/26/2019 this appears to represent a new finding. This nodule has mean  Hounsfield units of 31.5 compared with mean Hounsfield units of 1.44 on 08/26/2019. Cannot rule out right adrenal gland involvement. 2. No FDG avid (Deauville criteria 3 or greater) lymph nodes or mass identified within the remainder of the neck, chest, abdomen or pelvis. 3. Aortic atherosclerosis, in addition to RCA and LAD coronary artery disease. Please note that although the presence of coronary artery calcium documents the presence of coronary artery disease, the severity of this disease and any potential stenosis cannot be assessed on this non-gated CT examination. Assessment for potential risk factor modification, dietary therapy or pharmacologic therapy may be warranted, if clinically indicated. Aortic Atherosclerosis (ICD10-I70.0)." 2) .Marland Kitchen Past Medical History:  Diagnosis Date  . Arthritis   . Diabetes mellitus without complication ([place])    type 2  . Diverticulitis 15 yrs ago  . History of kidney stones    right ureteral stone and one in 2017  . Hypertension   . Lumbar herniated disc    L 4 L 5    3) CKD stage 3 PLAN: -Discussed pt labwork today, 10/31/19; of CBC w/diff and CMP is as follows: all values are WNL except for RBC at 3.80, Hemoglobin at 11.2, HCT at 34.5, Abs Immature Granulocytes at 0.09K, Glucose at 283, BUN at 28, Creatinine at 1.52, Total Bilirubin at 0.2, GFR, Est Non Af Am at 46, GFR, Est AFR Am at 53 -The pt has no prohibitive toxicities from continuing C2 R-CHOP with G-CSF support at this time. -Advised pt that we plan to complete up to 6 cycles of chemotherapy.  -Plan to begin IT MX for  CNS prophylaxis with C3  -Advised blood sugars are running high- pt is f/u with PCP  -Recommend pt use salt/baking soda mouthwash 3-4 times per day to prevent mouth soreness -Recommends scheduling 2-3 units of insulin with meals and using sliding scale on top of that if greater than 150.  -Recommended that the pt continue to eat well, drink at least 48-64 oz of water each day, and  walk 20-30 minutes each day -Continue 50K UT Vitamin D twice per week -Will see back with C3D1  FOLLOW UP: Plz schedule C3 of R-CHOP as per orders  Portflush, labs and MD visit with C3D1 IR for Intrathecal Methotrexate C3D15  The total time spent in the appt was 30 minutes and more than 50% was on counseling and direct patient cares , M x of IV chemotherapy, ordering and co-ordinating IT chemotherapy  All of the patient's questions were answered with apparent satisfaction. The patient knows to call the clinic with any problems, questions or concerns.  Sullivan Lone MD [place] AAHIVMS [facility] Hematology/Oncology Physician [facility]  (Office):       [phone] (Work cell):  [phone] (Fax):           [phone]  10/31/2019 11:02 AM  I, Dawayne Cirri am acting as a Education administrator for Dr. Sullivan Lone.   .I have reviewed the above documentation for accuracy and completeness, and I agree with the above. Brunetta Genera MD

## 2019-10-30 NOTE — Progress Notes (Signed)
10/30/19  Famotidine 20 mg IVPB added to premedications due to "stuffy sinus" complaint and dosed with famotidine 20 mg ivpb with symptom resolution with cycle #1.  T.O. Dr Cleda Clarks, PharmD

## 2019-10-31 ENCOUNTER — Other Ambulatory Visit: Payer: Self-pay

## 2019-10-31 ENCOUNTER — Inpatient Hospital Stay: Payer: Medicare Other | Admitting: Hematology

## 2019-10-31 ENCOUNTER — Telehealth: Payer: Self-pay | Admitting: *Deleted

## 2019-10-31 ENCOUNTER — Inpatient Hospital Stay: Payer: Medicare Other | Attending: Hematology

## 2019-10-31 VITALS — BP 115/72 | HR 79 | Temp 97.7°F | Resp 18 | Ht 70.5 in | Wt 209.8 lb

## 2019-10-31 DIAGNOSIS — N183 Chronic kidney disease, stage 3 unspecified: Secondary | ICD-10-CM | POA: Insufficient documentation

## 2019-10-31 DIAGNOSIS — Z5112 Encounter for antineoplastic immunotherapy: Secondary | ICD-10-CM | POA: Diagnosis present

## 2019-10-31 DIAGNOSIS — M1990 Unspecified osteoarthritis, unspecified site: Secondary | ICD-10-CM | POA: Diagnosis not present

## 2019-10-31 DIAGNOSIS — M5126 Other intervertebral disc displacement, lumbar region: Secondary | ICD-10-CM | POA: Insufficient documentation

## 2019-10-31 DIAGNOSIS — I2510 Atherosclerotic heart disease of native coronary artery without angina pectoris: Secondary | ICD-10-CM | POA: Diagnosis not present

## 2019-10-31 DIAGNOSIS — I129 Hypertensive chronic kidney disease with stage 1 through stage 4 chronic kidney disease, or unspecified chronic kidney disease: Secondary | ICD-10-CM | POA: Diagnosis not present

## 2019-10-31 DIAGNOSIS — Z7952 Long term (current) use of systemic steroids: Secondary | ICD-10-CM | POA: Diagnosis not present

## 2019-10-31 DIAGNOSIS — Z5189 Encounter for other specified aftercare: Secondary | ICD-10-CM | POA: Diagnosis not present

## 2019-10-31 DIAGNOSIS — Z79899 Other long term (current) drug therapy: Secondary | ICD-10-CM | POA: Diagnosis not present

## 2019-10-31 DIAGNOSIS — R200 Anesthesia of skin: Secondary | ICD-10-CM | POA: Insufficient documentation

## 2019-10-31 DIAGNOSIS — Z794 Long term (current) use of insulin: Secondary | ICD-10-CM | POA: Diagnosis not present

## 2019-10-31 DIAGNOSIS — R5383 Other fatigue: Secondary | ICD-10-CM | POA: Diagnosis not present

## 2019-10-31 DIAGNOSIS — Z87442 Personal history of urinary calculi: Secondary | ICD-10-CM | POA: Insufficient documentation

## 2019-10-31 DIAGNOSIS — Z5111 Encounter for antineoplastic chemotherapy: Secondary | ICD-10-CM | POA: Insufficient documentation

## 2019-10-31 DIAGNOSIS — I700 Atherosclerosis of aorta: Secondary | ICD-10-CM | POA: Diagnosis not present

## 2019-10-31 DIAGNOSIS — C8339 Diffuse large B-cell lymphoma, extranodal and solid organ sites: Secondary | ICD-10-CM

## 2019-10-31 DIAGNOSIS — C83398 Diffuse large b-cell lymphoma of other extranodal and solid organ sites: Secondary | ICD-10-CM

## 2019-10-31 DIAGNOSIS — Z9079 Acquired absence of other genital organ(s): Secondary | ICD-10-CM | POA: Insufficient documentation

## 2019-10-31 DIAGNOSIS — Z880 Allergy status to penicillin: Secondary | ICD-10-CM | POA: Diagnosis not present

## 2019-10-31 DIAGNOSIS — E1122 Type 2 diabetes mellitus with diabetic chronic kidney disease: Secondary | ICD-10-CM | POA: Diagnosis not present

## 2019-10-31 DIAGNOSIS — Z885 Allergy status to narcotic agent status: Secondary | ICD-10-CM | POA: Insufficient documentation

## 2019-10-31 DIAGNOSIS — Z888 Allergy status to other drugs, medicaments and biological substances status: Secondary | ICD-10-CM | POA: Insufficient documentation

## 2019-10-31 DIAGNOSIS — I10 Essential (primary) hypertension: Secondary | ICD-10-CM | POA: Diagnosis not present

## 2019-10-31 DIAGNOSIS — C8335 Diffuse large B-cell lymphoma, lymph nodes of inguinal region and lower limb: Secondary | ICD-10-CM | POA: Diagnosis present

## 2019-10-31 LAB — CMP (CANCER CENTER ONLY)
ALT: 24 U/L (ref 0–44)
AST: 19 U/L (ref 15–41)
Albumin: 3.9 g/dL (ref 3.5–5.0)
Alkaline Phosphatase: 95 U/L (ref 38–126)
Anion gap: 8 (ref 5–15)
BUN: 28 mg/dL — ABNORMAL HIGH (ref 8–23)
CO2: 23 mmol/L (ref 22–32)
Calcium: 9.8 mg/dL (ref 8.9–10.3)
Chloride: 106 mmol/L (ref 98–111)
Creatinine: 1.52 mg/dL — ABNORMAL HIGH (ref 0.61–1.24)
GFR, Est AFR Am: 53 mL/min — ABNORMAL LOW (ref >60)
GFR, Estimated: 46 mL/min — ABNORMAL LOW (ref >60)
Glucose, Bld: 283 mg/dL — ABNORMAL HIGH (ref 70–99)
Potassium: 5 mmol/L (ref 3.5–5.1)
Sodium: 137 mmol/L (ref 135–145)
Total Bilirubin: 0.2 mg/dL — ABNORMAL LOW (ref 0.3–1.2)
Total Protein: 7.2 g/dL (ref 6.5–8.1)

## 2019-10-31 LAB — CBC WITH DIFFERENTIAL (CANCER CENTER ONLY)
Abs Immature Granulocytes: 0.09 10*3/uL — ABNORMAL HIGH (ref 0.00–0.07)
Basophils Absolute: 0.1 10*3/uL (ref 0.0–0.1)
Basophils Relative: 1 %
Eosinophils Absolute: 0 10*3/uL (ref 0.0–0.5)
Eosinophils Relative: 0 %
HCT: 34.5 % — ABNORMAL LOW (ref 39.0–52.0)
Hemoglobin: 11.2 g/dL — ABNORMAL LOW (ref 13.0–17.0)
Immature Granulocytes: 1 %
Lymphocytes Relative: 17 %
Lymphs Abs: 1.4 10*3/uL (ref 0.7–4.0)
MCH: 29.5 pg (ref 26.0–34.0)
MCHC: 32.5 g/dL (ref 30.0–36.0)
MCV: 90.8 fL (ref 80.0–100.0)
Monocytes Absolute: 0.7 10*3/uL (ref 0.1–1.0)
Monocytes Relative: 9 %
Neutro Abs: 5.7 10*3/uL (ref 1.7–7.7)
Neutrophils Relative %: 72 %
Platelet Count: 323 10*3/uL (ref 150–400)
RBC: 3.8 MIL/uL — ABNORMAL LOW (ref 4.22–5.81)
RDW: 14.2 % (ref 11.5–15.5)
WBC Count: 8 10*3/uL (ref 4.0–10.5)
nRBC: 0 % (ref 0.0–0.2)

## 2019-10-31 NOTE — Progress Notes (Signed)
Okay to treat with Creatinine lab from 10/31/19 per Dr Irene Limbo

## 2019-10-31 NOTE — Telephone Encounter (Signed)
Opened in erro

## 2019-11-01 ENCOUNTER — Ambulatory Visit: Payer: Medicare Other

## 2019-11-01 ENCOUNTER — Other Ambulatory Visit: Payer: Self-pay

## 2019-11-01 ENCOUNTER — Inpatient Hospital Stay: Payer: Medicare Other

## 2019-11-01 VITALS — BP 117/55 | HR 83 | Temp 98.0°F | Resp 17

## 2019-11-01 DIAGNOSIS — C8339 Diffuse large B-cell lymphoma, extranodal and solid organ sites: Secondary | ICD-10-CM

## 2019-11-01 DIAGNOSIS — Z5112 Encounter for antineoplastic immunotherapy: Secondary | ICD-10-CM | POA: Diagnosis not present

## 2019-11-01 MED ORDER — SODIUM CHLORIDE 0.9 % IV SOLN
500.0000 mg/m2 | Freq: Once | INTRAVENOUS | Status: AC
  Administered 2019-11-01: 1120 mg via INTRAVENOUS
  Filled 2019-11-01: qty 56

## 2019-11-01 MED ORDER — SODIUM CHLORIDE 0.9 % IV SOLN
375.0000 mg/m2 | Freq: Once | INTRAVENOUS | Status: AC
  Administered 2019-11-01: 800 mg via INTRAVENOUS
  Filled 2019-11-01: qty 30

## 2019-11-01 MED ORDER — SODIUM CHLORIDE 0.9 % IV SOLN
150.0000 mg | Freq: Once | INTRAVENOUS | Status: AC
  Administered 2019-11-01: 150 mg via INTRAVENOUS
  Filled 2019-11-01: qty 150

## 2019-11-01 MED ORDER — DOXORUBICIN HCL CHEMO IV INJECTION 2 MG/ML
50.0000 mg/m2 | Freq: Once | INTRAVENOUS | Status: AC
  Administered 2019-11-01: 112 mg via INTRAVENOUS
  Filled 2019-11-01: qty 56

## 2019-11-01 MED ORDER — PALONOSETRON HCL INJECTION 0.25 MG/5ML
INTRAVENOUS | Status: AC
  Filled 2019-11-01: qty 5

## 2019-11-01 MED ORDER — DIPHENHYDRAMINE HCL 25 MG PO CAPS
ORAL_CAPSULE | ORAL | Status: AC
  Filled 2019-11-01: qty 2

## 2019-11-01 MED ORDER — DIPHENHYDRAMINE HCL 25 MG PO CAPS
50.0000 mg | ORAL_CAPSULE | Freq: Once | ORAL | Status: AC
  Administered 2019-11-01: 50 mg via ORAL

## 2019-11-01 MED ORDER — SODIUM CHLORIDE 0.9 % IV SOLN
10.0000 mg | Freq: Once | INTRAVENOUS | Status: AC
  Administered 2019-11-01: 10 mg via INTRAVENOUS
  Filled 2019-11-01: qty 10

## 2019-11-01 MED ORDER — ACETAMINOPHEN 325 MG PO TABS
650.0000 mg | ORAL_TABLET | Freq: Once | ORAL | Status: AC
  Administered 2019-11-01: 650 mg via ORAL

## 2019-11-01 MED ORDER — SODIUM CHLORIDE 0.9% FLUSH
10.0000 mL | INTRAVENOUS | Status: DC | PRN
  Administered 2019-11-01: 10 mL
  Filled 2019-11-01: qty 10

## 2019-11-01 MED ORDER — SODIUM CHLORIDE 0.9 % IV SOLN
Freq: Once | INTRAVENOUS | Status: AC
  Filled 2019-11-01: qty 250

## 2019-11-01 MED ORDER — FAMOTIDINE IN NACL 20-0.9 MG/50ML-% IV SOLN
INTRAVENOUS | Status: AC
  Filled 2019-11-01: qty 50

## 2019-11-01 MED ORDER — FAMOTIDINE IN NACL 20-0.9 MG/50ML-% IV SOLN
20.0000 mg | Freq: Once | INTRAVENOUS | Status: AC
  Administered 2019-11-01: 20 mg via INTRAVENOUS

## 2019-11-01 MED ORDER — ACETAMINOPHEN 325 MG PO TABS
ORAL_TABLET | ORAL | Status: AC
  Filled 2019-11-01: qty 2

## 2019-11-01 MED ORDER — HEPARIN SOD (PORK) LOCK FLUSH 100 UNIT/ML IV SOLN
500.0000 [IU] | Freq: Once | INTRAVENOUS | Status: AC | PRN
  Administered 2019-11-01: 500 [IU]
  Filled 2019-11-01: qty 5

## 2019-11-01 MED ORDER — PALONOSETRON HCL INJECTION 0.25 MG/5ML
0.2500 mg | Freq: Once | INTRAVENOUS | Status: AC
  Administered 2019-11-01: 0.25 mg via INTRAVENOUS

## 2019-11-01 MED ORDER — VINCRISTINE SULFATE CHEMO INJECTION 1 MG/ML
2.0000 mg | Freq: Once | INTRAVENOUS | Status: AC
  Administered 2019-11-01: 2 mg via INTRAVENOUS
  Filled 2019-11-01: qty 2

## 2019-11-01 NOTE — Patient Instructions (Signed)
[facility] Discharge Instructions for Patients Receiving Chemotherapy  Today you received the following chemotherapy agents Adriamycin, Vincristine, Cytoxan and Ruxience.  To help prevent nausea and vomiting after your treatment, we encourage you to take your nausea medication as directed  BUT NO ZOFRAN FOR 3 DAYS AFTER CHEMO.   If you develop nausea and vomiting that is not controlled by your nausea medication, call the clinic.   BELOW ARE SYMPTOMS THAT SHOULD BE REPORTED IMMEDIATELY:  *FEVER GREATER THAN 100.5 F  *CHILLS WITH OR WITHOUT FEVER  NAUSEA AND VOMITING THAT IS NOT CONTROLLED WITH YOUR NAUSEA MEDICATION  *UNUSUAL SHORTNESS OF BREATH  *UNUSUAL BRUISING OR BLEEDING  TENDERNESS IN MOUTH AND THROAT WITH OR WITHOUT PRESENCE OF ULCERS  *URINARY PROBLEMS  *BOWEL PROBLEMS  UNUSUAL RASH Items with * indicate a potential emergency and should be followed up as soon as possible.  Feel free to call the clinic you have any questions or concerns. The clinic phone number is [phone].  Please show the [place] at check-in to the Emergency Department and triage nurse.

## 2019-11-03 ENCOUNTER — Inpatient Hospital Stay: Payer: Medicare Other

## 2019-11-03 ENCOUNTER — Other Ambulatory Visit: Payer: Self-pay

## 2019-11-03 VITALS — BP 112/77 | HR 73 | Temp 98.0°F | Resp 18

## 2019-11-03 DIAGNOSIS — Z5112 Encounter for antineoplastic immunotherapy: Secondary | ICD-10-CM | POA: Diagnosis not present

## 2019-11-03 DIAGNOSIS — C8339 Diffuse large B-cell lymphoma, extranodal and solid organ sites: Secondary | ICD-10-CM

## 2019-11-03 MED ORDER — PEGFILGRASTIM-CBQV 6 MG/0.6ML ~~LOC~~ SOSY
PREFILLED_SYRINGE | SUBCUTANEOUS | Status: AC
  Filled 2019-11-03: qty 0.6

## 2019-11-03 MED ORDER — PEGFILGRASTIM-CBQV 6 MG/0.6ML ~~LOC~~ SOSY
6.0000 mg | PREFILLED_SYRINGE | Freq: Once | SUBCUTANEOUS | Status: AC
  Administered 2019-11-03: 6 mg via SUBCUTANEOUS

## 2019-11-13 ENCOUNTER — Other Ambulatory Visit: Payer: Self-pay | Admitting: Hematology

## 2019-11-17 ENCOUNTER — Telehealth: Payer: Self-pay | Admitting: Hematology

## 2019-11-17 NOTE — Telephone Encounter (Signed)
Scheduled per 07/09 los, patient has been called and notified.

## 2019-11-19 ENCOUNTER — Other Ambulatory Visit: Payer: Self-pay

## 2019-11-19 ENCOUNTER — Other Ambulatory Visit: Payer: Medicare Other

## 2019-11-19 ENCOUNTER — Inpatient Hospital Stay: Payer: Medicare Other

## 2019-11-19 ENCOUNTER — Inpatient Hospital Stay (HOSPITAL_BASED_OUTPATIENT_CLINIC_OR_DEPARTMENT_OTHER): Payer: Medicare Other | Admitting: Hematology

## 2019-11-19 ENCOUNTER — Other Ambulatory Visit: Payer: Self-pay | Admitting: Hematology

## 2019-11-19 VITALS — BP 118/70 | HR 109 | Temp 97.3°F | Resp 18 | Ht 70.5 in | Wt 209.8 lb

## 2019-11-19 DIAGNOSIS — C8339 Diffuse large B-cell lymphoma, extranodal and solid organ sites: Secondary | ICD-10-CM

## 2019-11-19 DIAGNOSIS — Z5111 Encounter for antineoplastic chemotherapy: Secondary | ICD-10-CM | POA: Diagnosis not present

## 2019-11-19 DIAGNOSIS — Z5112 Encounter for antineoplastic immunotherapy: Secondary | ICD-10-CM | POA: Diagnosis not present

## 2019-11-19 DIAGNOSIS — Z95828 Presence of other vascular implants and grafts: Secondary | ICD-10-CM

## 2019-11-19 LAB — CMP (CANCER CENTER ONLY)
ALT: 18 U/L (ref 0–44)
AST: 16 U/L (ref 15–41)
Albumin: 3.9 g/dL (ref 3.5–5.0)
Alkaline Phosphatase: 107 U/L (ref 38–126)
Anion gap: 10 (ref 5–15)
BUN: 29 mg/dL — ABNORMAL HIGH (ref 8–23)
CO2: 23 mmol/L (ref 22–32)
Calcium: 10.4 mg/dL — ABNORMAL HIGH (ref 8.9–10.3)
Chloride: 105 mmol/L (ref 98–111)
Creatinine: 1.65 mg/dL — ABNORMAL HIGH (ref 0.61–1.24)
GFR, Est AFR Am: 48 mL/min — ABNORMAL LOW (ref >60)
GFR, Estimated: 41 mL/min — ABNORMAL LOW (ref >60)
Glucose, Bld: 240 mg/dL — ABNORMAL HIGH (ref 70–99)
Potassium: 4.3 mmol/L (ref 3.5–5.1)
Sodium: 138 mmol/L (ref 135–145)
Total Bilirubin: <0.2 mg/dL — ABNORMAL LOW (ref 0.3–1.2)
Total Protein: 7 g/dL (ref 6.5–8.1)

## 2019-11-19 LAB — CBC WITH DIFFERENTIAL/PLATELET
Abs Immature Granulocytes: 0.21 10*3/uL — ABNORMAL HIGH (ref 0.00–0.07)
Basophils Absolute: 0 10*3/uL (ref 0.0–0.1)
Basophils Relative: 0 %
Eosinophils Absolute: 0.1 10*3/uL (ref 0.0–0.5)
Eosinophils Relative: 0 %
HCT: 31.3 % — ABNORMAL LOW (ref 39.0–52.0)
Hemoglobin: 10.5 g/dL — ABNORMAL LOW (ref 13.0–17.0)
Immature Granulocytes: 2 %
Lymphocytes Relative: 14 %
Lymphs Abs: 1.6 10*3/uL (ref 0.7–4.0)
MCH: 30.3 pg (ref 26.0–34.0)
MCHC: 33.5 g/dL (ref 30.0–36.0)
MCV: 90.2 fL (ref 80.0–100.0)
Monocytes Absolute: 0.9 10*3/uL (ref 0.1–1.0)
Monocytes Relative: 8 %
Neutro Abs: 8.5 10*3/uL — ABNORMAL HIGH (ref 1.7–7.7)
Neutrophils Relative %: 76 %
Platelets: 321 10*3/uL (ref 150–400)
RBC: 3.47 MIL/uL — ABNORMAL LOW (ref 4.22–5.81)
RDW: 15.3 % (ref 11.5–15.5)
WBC: 11.3 10*3/uL — ABNORMAL HIGH (ref 4.0–10.5)
nRBC: 0.3 % — ABNORMAL HIGH (ref 0.0–0.2)

## 2019-11-19 LAB — LACTATE DEHYDROGENASE: LDH: 180 U/L (ref 98–192)

## 2019-11-19 MED ORDER — HEPARIN SOD (PORK) LOCK FLUSH 100 UNIT/ML IV SOLN
500.0000 [IU] | Freq: Once | INTRAVENOUS | Status: AC
  Administered 2019-11-19: 500 [IU]
  Filled 2019-11-19: qty 5

## 2019-11-19 MED ORDER — SODIUM CHLORIDE 0.9% FLUSH
10.0000 mL | Freq: Once | INTRAVENOUS | Status: AC
  Administered 2019-11-19: 10 mL
  Filled 2019-11-19: qty 10

## 2019-11-19 NOTE — Patient Instructions (Signed)

## 2019-11-19 NOTE — Progress Notes (Signed)
HEMATOLOGY/ONCOLOGY CONSULTATION NOTE  Date of Service: 11/19/2019  Patient Care Team: [name], L.[name], MD as PCP - General (Family Medicine)  CHIEF COMPLAINTS/PURPOSE OF CONSULTATION:  Primary Testicular large B cell lymphoma   HISTORY OF PRESENTING ILLNESS:   Patrick Bautista is a wonderful 69 y.o. male who has been referred to [place] by Dr. 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 Pt is accompanied today by his girlfriend, [name]. The pt reports that he is doing well overall.    Pt received his second COVID19 vaccine on 02/13. Soon after he began having sweating in his groin, numbness in his left thigh, and numbness in two fingers on his left hand. After the symptoms began pt went to see his PCP who thought he had a hydrocele. He returned a month later, with continued symptoms, and received a testicular US which found a mass. Pt had a left Orchiectomy performed on 09/03/2019. The pt reports that he saw Dr. Louis Meckel this morning and has been healing well from his surgery.   His Diabetes has been relatively well-controlled with diet and Glimepiride. It has been many years since he has had concerns with Diverticulitis and last had kidney stones in 2017. He has previously been seen by a Nephrologist who felt that his kidney disease could have been partially caused by Cystoscopy with Dr. Louis Meckel in 2018. Pt has no history of lung or heart dysfunction or neuropathy.   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   Of note prior to the patient's visit today, pt has had Left Testicle and Spermatic Cord Surgical Pathology ([id number]) completed on 09/03/2019 with results revealing "Diffuse large B-cell lymphoma."   Pt has had CT C/A/P completed on 08/26/2019 with results revealing "1. Large left-sided testicular mass consistent with known testicular cancer. 2. No findings for metastatic disease involving the chest, abdomen, or pelvis.  3. A few small scattered retroperitoneal lymph nodes but no mass or overt adenopathy. "  Most recent lab results (09/01/2019) of CBC w/diff and CMP is as follows: all values are WNL except for Hgb at 12.7, Glucose at 177, BUN at 50, Creatinine at 1.91, GFR Est Non Af Am at 41.  On review of systems, pt reports anxiety, stress and denies neuropathy, right testicular pain/swelling, abdominal pain and any other symptoms.   On PMHx the pt reports HTN, Diabetes Type II, Diverticulitis, Arthritis, Lumbar herniated disc, Left Orchiectomy. On Social Hx the pt reports that he is a retired Agricultural consultant.   INTERVAL HISTORY:  Patrick Bautista is a wonderful 69 y.o. 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 He is here prior to C3D1 R-CHOP. The patient's last visit with [place] was on 10/31/2019. The pt reports that he is doing well overall.  The pt reports that he is feeling good and recovered from his second treatment better than he did the first. He has been eating well and has been maintaing his weight. Pt used Miralax and Senna to prevent constipation this cycle. He has continued following with his Endocrinologist, who recently increased his inuslin dosage.   Lab results today (11/19/19) of CBC w/diff and CMP is as follows: all values are WNL except for WBC at 11.3K, RBC at 3.47, Hgb at 10.5, HCT at 31.3, nRBC at 0.3, Neutro Abs at 8.5K, Abs Immature Granulocytes at 0.21K, Glucose at 240, BUN at 29, Creatinine at 1.65, Calcium at 10.4, Total Bilirubin at <0.2,  GFR Est Non Af Am at 41. 11/19/2019 LDH at 180  On review of systems, pt denies unexpected weight loss, loss of appetite, constipation, mouth sores, vomiting, nausea and any other symptoms.    MEDICAL HISTORY:  Past Medical History:  Diagnosis Date  . Arthritis   . Diabetes mellitus without complication ([place])    type 2  . Diverticulitis 15 yrs ago  . History of kidney stones    right ureteral stone and one  in 2017  . Hypertension   . Lumbar herniated disc    L 4 L 5     SURGICAL HISTORY: Past Surgical History:  Procedure Laterality Date  . colonscopy  15 yrs ago  . CYSTOSCOPY/URETEROSCOPY/HOLMIUM LASER/STENT PLACEMENT Right 02/16/2017   Procedure: CYSTOSCOPY/RETROGRADE/URETEROSCOPY/HOLMIUM LASER/STENT PLACEMENT;  Surgeon: Ardis Hughs, MD;  Location: [facility];  Service: Urology;  Laterality: Right;  NEEDS 60 MIN FOR PROCEDURE  . IR IMAGING GUIDED PORT INSERTION  09/19/2019  . MOUTH SURGERY  20 yrs ago  . ORCHIECTOMY Left 09/03/2019   Procedure: LEFT ORCHIECTOMY;  Surgeon: Ardis Hughs, MD;  Location: [facility];  Service: Urology;  Laterality: Left;    SOCIAL HISTORY: Social History   Socioeconomic History  . Marital status: Legally Separated    Spouse name: Not on file  . Number of children: Not on file  . Years of education: Not on file  . Highest education level: Not on file  Occupational History  . Not on file  Tobacco Use  . Smoking status: Never Smoker  . Smokeless tobacco: Never Used  Vaping Use  . Vaping Use: Never used  Substance and Sexual Activity  . Alcohol use: No  . Drug use: No  . Sexual activity: Not on file  Other Topics Concern  . Not on file  Social History Narrative  . Not on file   Social Determinants of Health   Financial Resource Strain:   . Difficulty of Paying Living Expenses:   Food Insecurity:   . Worried About Charity fundraiser in the Last Year:   . Arboriculturist in the Last Year:   Transportation Needs:   . Film/video editor (Medical):   Marland Kitchen Lack of Transportation (Non-Medical):   Physical Activity:   . Days of Exercise per Week:   . Minutes of Exercise per Session:   Stress:   . Feeling of Stress :   Social Connections:   . Frequency of Communication with Friends and Family:   . Frequency of Social Gatherings with Friends and Family:   . Attends Religious Services:   . Active Member of Clubs or Organizations:   .  Attends Archivist Meetings:   Marland Kitchen Marital Status:   Intimate Partner Violence:   . Fear of Current or Ex-Partner:   . Emotionally Abused:   Marland Kitchen Physically Abused:   . Sexually Abused:     FAMILY HISTORY: No family history on file.  ALLERGIES:  is allergic to hydrocodone, percocet [oxycodone-acetaminophen], codeine, penicillins, and tape.  MEDICATIONS:  Current Outpatient Medications  Medication Sig Dispense Refill  . acetaminophen (TYLENOL) 500 MG tablet Take 500-1,000 mg by mouth every 6 (six) hours as needed (pain.).     Marland Kitchen ALPRAZolam (XANAX) 0.25 MG tablet Take 0.25 mg by mouth at bedtime as needed for sleep.    Marland Kitchen amLODipine (NORVASC) 2.5 MG tablet Take 2.5 mg by mouth daily. 2.5 mg + 5 mg =7.5 mg total daily dose    .  amLODipine (NORVASC) 5 MG tablet Take 5 mg by mouth daily. 2.5 mg + 5 mg =7.5 mg total daily dose  11  . citalopram (CELEXA) 10 MG tablet TAKE 1 TABLET BY MOUTH EVERY DAY 90 tablet 1  . ergocalciferol (VITAMIN D2) 1.25 MG (50000 UT) capsule Take 1 capsule (50,000 Units total) by mouth 2 (two) times a week. 24 capsule 2  . fluocinonide cream (LIDEX) 6.31 % Apply 1 application topically 2 (two) times daily as needed (skin irritation.).    Marland Kitchen glimepiride (AMARYL) 1 MG tablet Take 1 mg by mouth daily.  12  . HUMALOG KWIKPEN 100 UNIT/ML KwikPen     . lidocaine-prilocaine (EMLA) cream Apply to affected area once 30 g 3  . LORazepam (ATIVAN) 0.5 MG tablet Take 1 tablet (0.5 mg total) by mouth every 6 (six) hours as needed (Nausea or vomiting). 30 tablet 0  . olmesartan-hydrochlorothiazide (BENICAR HCT) 40-25 MG tablet Take 1 tablet by mouth daily.    . ondansetron (ZOFRAN) 8 MG tablet Take 1 tablet (8 mg total) by mouth 2 (two) times daily as needed for refractory nausea / vomiting. Start on day 3 after cyclophosphamide chemotherapy. 30 tablet 1  . pantoprazole (PROTONIX) 40 MG tablet TAKE 1 TABLET BY MOUTH DAILY BEFORE BREAKFAST 30 tablet 1  . predniSONE (DELTASONE) 20  MG tablet Take 3 tablets (60 mg total) by mouth daily with breakfast. Take with food on days 1-5 of chemotherapy. 15 tablet 5  . prochlorperazine (COMPAZINE) 10 MG tablet Take 1 tablet (10 mg total) by mouth every 6 (six) hours as needed (Nausea or vomiting). 30 tablet 6  . senna-docusate (SENNA S) 8.6-50 MG tablet Take 2 tablets by mouth at bedtime as needed for mild constipation or moderate constipation. 60 tablet 1  . traMADol (ULTRAM) 50 MG tablet Take 1-2 tablets (50-100 mg total) by mouth every 6 (six) hours as needed for moderate pain. 15 tablet 0  . TRESIBA FLEXTOUCH 100 UNIT/ML FlexTouch Pen      No current facility-administered medications for this visit.    REVIEW OF SYSTEMS:   A 10+ POINT REVIEW OF SYSTEMS WAS OBTAINED including neurology, dermatology, psychiatry, cardiac, respiratory, lymph, extremities, GI, GU, Musculoskeletal, constitutional, breasts, reproductive, HEENT.  All pertinent positives are noted in the HPI.  All others are negative.   PHYSICAL EXAMINATION: ECOG PERFORMANCE STATUS: 0 - Asymptomatic  . Vitals:   11/19/19 1454  BP: 118/70  Pulse: (!) 109  Resp: 18  Temp: (!) 97.3 F (36.3 C)  SpO2: 99%   Filed Weights   11/19/19 1454  Weight: (!) 209 lb 12.8 oz (95.2 kg)   .Body mass index is 29.68 kg/m.   GENERAL:alert, in no acute distress and comfortable SKIN: no acute rashes, no significant lesions EYES: conjunctiva are pink and non-injected, sclera anicteric OROPHARYNX: MMM, no exudates, no oropharyngeal erythema or ulceration NECK: supple, no JVD LYMPH:  no palpable lymphadenopathy in the cervical, axillary or inguinal regions LUNGS: clear to auscultation b/l with normal respiratory effort HEART: regular rate & rhythm ABDOMEN:  normoactive bowel sounds , non tender, not distended. No palpable hepatosplenomegaly.  Extremity: no pedal edema PSYCH: alert & oriented x 3 with fluent speech NEURO: no focal motor/sensory deficits  LABORATORY DATA:   I have reviewed the data as listed  . CBC Latest Ref Rng & Units 11/19/2019 10/31/2019 10/20/2019  WBC 4.0 - 10.5 K/uL 11.3(H) 8.0 3.0(L)  Hemoglobin 13.0 - 17.0 g/dL 10.5(L) 11.2(L) 10.8(L)  Hematocrit 39 - 52 %  31.3(L) 34.5(L) 32.9(L)  Platelets 150 - 400 K/uL 321 323 181    . CMP Latest Ref Rng & Units 11/19/2019 10/31/2019 10/20/2019  Glucose 70 - 99 mg/dL 240(H) 283(H) 250(H)  BUN 8 - 23 mg/dL 29(H) 28(H) 28(H)  Creatinine 0.61 - 1.24 mg/dL 1.65(H) 1.52(H) 1.61(H)  Sodium 135 - 145 mmol/L 138 137 139  Potassium 3.5 - 5.1 mmol/L 4.3 5.0 4.6  Chloride 98 - 111 mmol/L 105 106 103  CO2 22 - 32 mmol/L 23 23 25   Calcium 8.9 - 10.3 mg/dL 10.4(H) 9.8 9.5  Total Protein 6.5 - 8.1 g/dL 7.0 7.2 6.9  Total Bilirubin 0.3 - 1.2 mg/dL <0.2(L) 0.2(L) <0.2(L)  Alkaline Phos 38 - 126 U/L 107 95 81  AST 15 - 41 U/L 16 19 16   ALT 0 - 44 U/L 18 24 26    09/03/2019 Left Testicle and Spermatic Cord Surgical Pathology ([id number]) :    RADIOGRAPHIC STUDIES: I have personally reviewed the radiological images as listed and agreed with the findings in the report. No results found.  ASSESSMENT & PLAN:   69 yo with   1) Newly diagnosed left primary testicular large B cell lymphoma 09/03/2019 Left Testicle and Spermatic Cord Surgical Pathology ([id number]) revealed "Diffuse large B-cell lymphoma."  08/26/2019 CT C/A/P revealed "1. Large left-sided testicular mass consistent with known testicular cancer. 2. No findings for metastatic disease involving the chest, abdomen, or pelvis. 3. A few small scattered retroperitoneal lymph nodes but no mass or overt adenopathy. " 09/30/2019 PET/CT ([id number]) revealed "1. Small right adrenal nodule exhibits intense tracer uptake with SUV max of 13.03. Compared with the study from 08/26/2019 this appears to represent a new finding. This nodule has mean Hounsfield units of 31.5 compared with mean Hounsfield units of 1.44 on 08/26/2019. Cannot rule out right adrenal  gland involvement. 2. No FDG avid (Deauville criteria 3 or greater) lymph nodes or mass identified within the remainder of the neck, chest, abdomen or pelvis. 3. Aortic atherosclerosis, in addition to RCA and LAD coronary artery disease. Please note that although the presence of coronary artery calcium documents the presence of coronary artery disease, the severity of this disease and any potential stenosis cannot be assessed on this non-gated CT examination. Assessment for potential risk factor modification, dietary therapy or pharmacologic therapy may be warranted, if clinically indicated. Aortic Atherosclerosis (ICD10-I70.0)." 2) .Marland Kitchen Past Medical History:  Diagnosis Date  . Arthritis   . Diabetes mellitus without complication ([place])    type 2  . Diverticulitis 15 yrs ago  . History of kidney stones    right ureteral stone and one in 2017  . Hypertension   . Lumbar herniated disc    L 4 L 5    3) CKD stage 3 PLAN: -Discussed pt labwork today, 11/19/19; WBC & PLT are good, mild anemia, kidney function is stable, blood glucose is elevated, LDH is WNL -The pt has no prohibitive toxicities from continuing C3 R-CHOP with G-CSF support at this time. -The pt has no prohibiitve toxicities from beginning IT MTX on C3D15.  -Plan to complete up to 6 cycles of chemotherapy, 3 cycles of IT MTX  -Continue 50K UT Vitamin D twice per week -Will see back with C4 R-CHOP   FOLLOW UP: Intrathecal Methotrexate on C3D15 with Interventional Radiology Plz schedule C4 of R-CHOP with portflush, labs and MD visit   The total time spent in the appt was 30 minutes and more than 50% was on counseling and direct patient  cares, ordering and mx of IV and IT chemotherapy and co-ordination of cares  All of the patient's questions were answered with apparent satisfaction. The patient knows to call the clinic with any problems, questions or concerns.   Sullivan Lone MD [place] AAHIVMS [facility] Hematology/Oncology  Physician [name]  (Office):       [phone] (Work cell):  [phone] (Fax):           [phone]  11/19/2019 3:55 PM  I, [name], am acting as a scribe for Dr. Sullivan Lone.   .I have reviewed the above documentation for accuracy and completeness, and I agree with the above. Brunetta Genera MD

## 2019-11-21 ENCOUNTER — Other Ambulatory Visit: Payer: Self-pay

## 2019-11-21 ENCOUNTER — Other Ambulatory Visit: Payer: Medicare Other

## 2019-11-21 ENCOUNTER — Inpatient Hospital Stay: Payer: Medicare Other

## 2019-11-21 VITALS — BP 108/56 | HR 84 | Temp 97.9°F | Resp 16 | Wt 209.5 lb

## 2019-11-21 DIAGNOSIS — Z5112 Encounter for antineoplastic immunotherapy: Secondary | ICD-10-CM | POA: Diagnosis not present

## 2019-11-21 DIAGNOSIS — C8339 Diffuse large B-cell lymphoma, extranodal and solid organ sites: Secondary | ICD-10-CM

## 2019-11-21 MED ORDER — DOXORUBICIN HCL CHEMO IV INJECTION 2 MG/ML
50.0000 mg/m2 | Freq: Once | INTRAVENOUS | Status: AC
  Administered 2019-11-21: 112 mg via INTRAVENOUS
  Filled 2019-11-21: qty 56

## 2019-11-21 MED ORDER — PALONOSETRON HCL INJECTION 0.25 MG/5ML
0.2500 mg | Freq: Once | INTRAVENOUS | Status: AC
  Administered 2019-11-21: 0.25 mg via INTRAVENOUS

## 2019-11-21 MED ORDER — VINCRISTINE SULFATE CHEMO INJECTION 1 MG/ML
2.0000 mg | Freq: Once | INTRAVENOUS | Status: AC
  Administered 2019-11-21: 2 mg via INTRAVENOUS
  Filled 2019-11-21: qty 2

## 2019-11-21 MED ORDER — ACETAMINOPHEN 325 MG PO TABS
ORAL_TABLET | ORAL | Status: AC
  Filled 2019-11-21: qty 2

## 2019-11-21 MED ORDER — SODIUM CHLORIDE 0.9 % IV SOLN
150.0000 mg | Freq: Once | INTRAVENOUS | Status: AC
  Administered 2019-11-21: 150 mg via INTRAVENOUS
  Filled 2019-11-21: qty 150

## 2019-11-21 MED ORDER — SODIUM CHLORIDE 0.9 % IV SOLN
375.0000 mg/m2 | Freq: Once | INTRAVENOUS | Status: AC
  Administered 2019-11-21: 800 mg via INTRAVENOUS
  Filled 2019-11-21: qty 50

## 2019-11-21 MED ORDER — SODIUM CHLORIDE 0.9 % IV SOLN
Freq: Once | INTRAVENOUS | Status: AC
  Filled 2019-11-21: qty 250

## 2019-11-21 MED ORDER — SODIUM CHLORIDE 0.9 % IV SOLN
10.0000 mg | Freq: Once | INTRAVENOUS | Status: AC
  Administered 2019-11-21: 10 mg via INTRAVENOUS
  Filled 2019-11-21: qty 10

## 2019-11-21 MED ORDER — HEPARIN SOD (PORK) LOCK FLUSH 100 UNIT/ML IV SOLN
500.0000 [IU] | Freq: Once | INTRAVENOUS | Status: AC | PRN
  Administered 2019-11-21: 500 [IU]
  Filled 2019-11-21: qty 5

## 2019-11-21 MED ORDER — SODIUM CHLORIDE 0.9% FLUSH
10.0000 mL | INTRAVENOUS | Status: DC | PRN
  Administered 2019-11-21: 10 mL
  Filled 2019-11-21: qty 10

## 2019-11-21 MED ORDER — PROCHLORPERAZINE MALEATE 10 MG PO TABS
10.0000 mg | ORAL_TABLET | Freq: Four times a day (QID) | ORAL | Status: DC | PRN
  Administered 2019-11-21: 10 mg via ORAL

## 2019-11-21 MED ORDER — PROCHLORPERAZINE MALEATE 10 MG PO TABS
ORAL_TABLET | ORAL | Status: AC
  Filled 2019-11-21: qty 1

## 2019-11-21 MED ORDER — FAMOTIDINE IN NACL 20-0.9 MG/50ML-% IV SOLN
20.0000 mg | Freq: Once | INTRAVENOUS | Status: AC
  Administered 2019-11-21: 20 mg via INTRAVENOUS

## 2019-11-21 MED ORDER — DIPHENHYDRAMINE HCL 25 MG PO CAPS
50.0000 mg | ORAL_CAPSULE | Freq: Once | ORAL | Status: AC
  Administered 2019-11-21: 50 mg via ORAL

## 2019-11-21 MED ORDER — ACETAMINOPHEN 325 MG PO TABS
650.0000 mg | ORAL_TABLET | Freq: Once | ORAL | Status: AC
  Administered 2019-11-21: 650 mg via ORAL

## 2019-11-21 MED ORDER — SODIUM CHLORIDE 0.9 % IV SOLN
500.0000 mg/m2 | Freq: Once | INTRAVENOUS | Status: AC
  Administered 2019-11-21: 1120 mg via INTRAVENOUS
  Filled 2019-11-21: qty 56

## 2019-11-21 MED ORDER — DIPHENHYDRAMINE HCL 25 MG PO CAPS
ORAL_CAPSULE | ORAL | Status: AC
  Filled 2019-11-21: qty 2

## 2019-11-21 MED ORDER — PALONOSETRON HCL INJECTION 0.25 MG/5ML
INTRAVENOUS | Status: AC
  Filled 2019-11-21: qty 5

## 2019-11-21 MED ORDER — FAMOTIDINE IN NACL 20-0.9 MG/50ML-% IV SOLN
INTRAVENOUS | Status: AC
  Filled 2019-11-21: qty 50

## 2019-11-21 NOTE — Progress Notes (Signed)
Blood return noted before, during, and after Doxorubicin Injection.

## 2019-11-21 NOTE — Patient Instructions (Signed)
[facility] Discharge Instructions for Patients Receiving Chemotherapy  Today you received the following chemotherapy agents: Doxorubicin, Vincristine, Cytoxan, and immunotherapy agent: Rituximab  To help prevent nausea and vomiting after your treatment, we encourage you to take your nausea medication as directed by your MD.   If you develop nausea and vomiting that is not controlled by your nausea medication, call the clinic.   BELOW ARE SYMPTOMS THAT SHOULD BE REPORTED IMMEDIATELY:  *FEVER GREATER THAN 100.5 F  *CHILLS WITH OR WITHOUT FEVER  NAUSEA AND VOMITING THAT IS NOT CONTROLLED WITH YOUR NAUSEA MEDICATION  *UNUSUAL SHORTNESS OF BREATH  *UNUSUAL BRUISING OR BLEEDING  TENDERNESS IN MOUTH AND THROAT WITH OR WITHOUT PRESENCE OF ULCERS  *URINARY PROBLEMS  *BOWEL PROBLEMS  UNUSUAL RASH Items with * indicate a potential emergency and should be followed up as soon as possible.  Feel free to call the clinic should you have any questions or concerns. The clinic phone number is [phone].  Please show the CHEMO ALERT CARD at check-in to the Emergency Department and triage nurse.   

## 2019-11-22 ENCOUNTER — Inpatient Hospital Stay: Payer: Medicare Other

## 2019-11-22 ENCOUNTER — Other Ambulatory Visit: Payer: Self-pay

## 2019-11-22 VITALS — BP 164/99 | HR 95 | Temp 97.8°F | Resp 18

## 2019-11-22 DIAGNOSIS — Z5112 Encounter for antineoplastic immunotherapy: Secondary | ICD-10-CM | POA: Diagnosis not present

## 2019-11-22 DIAGNOSIS — C83398 Diffuse large b-cell lymphoma of other extranodal and solid organ sites: Secondary | ICD-10-CM

## 2019-11-22 DIAGNOSIS — C8339 Diffuse large B-cell lymphoma, extranodal and solid organ sites: Secondary | ICD-10-CM

## 2019-11-22 MED ORDER — PEGFILGRASTIM-CBQV 6 MG/0.6ML ~~LOC~~ SOSY
PREFILLED_SYRINGE | SUBCUTANEOUS | Status: AC
  Filled 2019-11-22: qty 0.6

## 2019-11-22 MED ORDER — PEGFILGRASTIM-CBQV 6 MG/0.6ML ~~LOC~~ SOSY
6.0000 mg | PREFILLED_SYRINGE | Freq: Once | SUBCUTANEOUS | Status: AC
  Administered 2019-11-22: 6 mg via SUBCUTANEOUS

## 2019-12-08 ENCOUNTER — Encounter (HOSPITAL_COMMUNITY): Payer: Self-pay

## 2019-12-08 ENCOUNTER — Ambulatory Visit (HOSPITAL_COMMUNITY)
Admission: RE | Admit: 2019-12-08 | Discharge: 2019-12-08 | Disposition: A | Discharge status: Home / Self Care | Payer: Medicare Other | Source: Ambulatory Visit | Attending: Hematology | Admitting: Hematology

## 2019-12-08 ENCOUNTER — Other Ambulatory Visit: Payer: Self-pay

## 2019-12-08 ENCOUNTER — Other Ambulatory Visit (HOSPITAL_COMMUNITY): Payer: Medicare Other

## 2019-12-08 ENCOUNTER — Other Ambulatory Visit: Payer: Self-pay | Admitting: Hematology

## 2019-12-08 DIAGNOSIS — C8339 Diffuse large B-cell lymphoma, extranodal and solid organ sites: Secondary | ICD-10-CM

## 2019-12-08 DIAGNOSIS — Z5111 Encounter for antineoplastic chemotherapy: Secondary | ICD-10-CM | POA: Diagnosis present

## 2019-12-08 LAB — CSF CELL COUNT WITH DIFFERENTIAL
RBC Count, CSF: 1 /mm3 — ABNORMAL HIGH
Tube #: 4
WBC, CSF: 1 /mm3 (ref 0–5)

## 2019-12-08 LAB — PROTEIN, CSF: Total  Protein, CSF: 32 mg/dL (ref 15–45)

## 2019-12-08 LAB — GLUCOSE, CAPILLARY: Glucose-Capillary: 170 mg/dL — ABNORMAL HIGH (ref 70–99)

## 2019-12-08 LAB — GLUCOSE, CSF: Glucose, CSF: 85 mg/dL — ABNORMAL HIGH (ref 40–70)

## 2019-12-08 MED ORDER — SODIUM CHLORIDE (PF) 0.9 % IJ SOLN
Freq: Once | INTRAMUSCULAR | Status: DC
  Filled 2019-12-08 (×2): qty 0.48

## 2019-12-08 MED ORDER — HEPARIN SOD (PORK) LOCK FLUSH 100 UNIT/ML IV SOLN
500.0000 [IU] | Freq: Once | INTRAVENOUS | Status: AC
  Administered 2019-12-08: 500 [IU] via INTRAVENOUS

## 2019-12-08 MED ORDER — SODIUM CHLORIDE 0.9 % IV SOLN: INTRAVENOUS | Status: DC

## 2019-12-08 MED ORDER — HEPARIN SOD (PORK) LOCK FLUSH 100 UNIT/ML IV SOLN
INTRAVENOUS | Status: AC
  Filled 2019-12-08: qty 5

## 2019-12-08 MED ORDER — SODIUM CHLORIDE 0.9 % IV SOLN
INTRAVENOUS | Status: AC
  Filled 2019-12-08: qty 250

## 2019-12-08 MED ORDER — LIDOCAINE HCL 1 % IJ SOLN
INTRAMUSCULAR | Status: AC
  Filled 2019-12-08: qty 20

## 2019-12-08 MED ORDER — SODIUM CHLORIDE 0.9 % IV SOLN
Freq: Once | INTRAVENOUS | Status: DC
  Filled 2019-12-08 (×2): qty 4

## 2019-12-08 NOTE — Discharge Instructions (Signed)
Discharge instructions taken from MD orders post procedure:  Follow up with referring physician as necessary   Remain flat at home as much as possible for the remainder of the day.  May remove bandaid in 24 hours and shower.  Report any signs of infection to referring MD.  Methotrexate injection What is this medicine? METHOTREXATE (METH oh TREX ate) is a chemotherapy drug used to treat cancer including breast cancer, leukemia, and lymphoma. This medicine can also be used to treat psoriasis and certain kinds of arthritis. This medicine may be used for other purposes; ask your health care provider or pharmacist if you have questions. What should I tell my health care provider before I take this medicine? They need to know if you have any of these conditions:  fluid in the stomach area or lungs  if you often drink alcohol  infection or immune system problems  kidney disease  liver disease  low blood counts, like low white cell, platelet, or red cell counts  lung disease  radiation therapy  stomach ulcers  ulcerative colitis  an unusual or allergic reaction to methotrexate, other medicines, foods, dyes, or preservatives  pregnant or trying to get pregnant  breast-feeding How should I use this medicine? This medicine is for infusion into a vein or for injection into muscle or into the spinal fluid (whichever applies). It is usually given by a health care professional in a hospital or clinic setting. In rare cases, you might get this medicine at home. You will be taught how to give this medicine. Use exactly as directed. Take your medicine at regular intervals. Do not take your medicine more often than directed. If this medicine is used for arthritis or psoriasis, it should be taken weekly, NOT daily. It is important that you put your used needles and syringes in a special sharps container. Do not put them in a trash can. If you do not have a sharps container, call your  pharmacist or healthcare provider to get one. Talk to your pediatrician regarding the use of this medicine in children. While this drug may be prescribed for children as young as 2 years for selected conditions, precautions do apply. Overdosage: If you think you have taken too much of this medicine contact a poison control center or emergency room at once. NOTE: This medicine is only for you. Do not share this medicine with others. What if I miss a dose? It is important not to miss your dose. Call your doctor or health care professional if you are unable to keep an appointment. If you give yourself the medicine and you miss a dose, talk with your doctor or health care professional. Do not take double or extra doses. What may interact with this medicine? This medicine may interact with the following medications:  acitretin  aspirin or aspirin-like medicines including salicylates  azathioprine  certain antibiotics like chloramphenicol, penicillin, tetracycline  certain medicines for stomach problems like esomeprazole, omeprazole, pantoprazole  cyclosporine  gold  hydroxychloroquine  live virus vaccines  mercaptopurine  NSAIDs, medicines for pain and inflammation, like ibuprofen or naproxen  other cytotoxic agents  penicillamine  phenylbutazone  phenytoin  probenacid  retinoids such as isotretinoin and tretinoin  steroid medicines like prednisone or cortisone  sulfonamides like sulfasalazine and trimethoprim/sulfamethoxazole  theophylline This list may not describe all possible interactions. Give your health care provider a list of all the medicines, herbs, non-prescription drugs, or dietary supplements you use. Also tell them if you smoke, drink alcohol,  or use illegal drugs. Some items may interact with your medicine. What should I watch for while using this medicine? Avoid alcoholic drinks. In some cases, you may be given additional medicines to help with side  effects. Follow all directions for their use. This medicine can make you more sensitive to the sun. Keep out of the sun. If you cannot avoid being in the sun, wear protective clothing and use sunscreen. Do not use sun lamps or tanning beds/booths. You may get drowsy or dizzy. Do not drive, use machinery, or do anything that needs mental alertness until you know how this medicine affects you. Do not stand or sit up quickly, especially if you are an older patient. This reduces the risk of dizzy or fainting spells. You may need blood work done while you are taking this medicine. Call your doctor or health care professional for advice if you get a fever, chills or sore throat, or other symptoms of a cold or flu. Do not treat yourself. This drug decreases your body's ability to fight infections. Try to avoid being around people who are sick. This medicine may increase your risk to bruise or bleed. Call your doctor or health care professional if you notice any unusual bleeding. Check with your doctor or health care professional if you get an attack of severe diarrhea, nausea and vomiting, or if you sweat a lot. The loss of too much body fluid can make it dangerous for you to take this medicine. Talk to your doctor about your risk of cancer. You may be more at risk for certain types of cancers if you take this medicine. Both men and women must use effective birth control with this medicine. Do not become pregnant while taking this medicine or until at least 1 normal menstrual cycle has occurred after stopping it. Women should inform their doctor if they wish to become pregnant or think they might be pregnant. Men should not father a child while taking this medicine and for 3 months after stopping it. There is a potential for serious side effects to an unborn child. Talk to your health care professional or pharmacist for more information. Do not breast-feed an infant while taking this medicine. What side effects  may I notice from receiving this medicine? Side effects that you should report to your doctor or health care professional as soon as possible:  allergic reactions like skin rash, itching or hives, swelling of the face, lips, or tongue  back pain  breathing problems or shortness of breath  confusion  diarrhea  dry, nonproductive cough  low blood counts - this medicine may decrease the number of white blood cells, red blood cells and platelets. You may be at increased risk of infections and bleeding  mouth sores  redness, blistering, peeling or loosening of the skin, including inside the mouth  seizures  severe headaches  signs of infection - fever or chills, cough, sore throat, pain or difficulty passing urine  signs and symptoms of bleeding such as bloody or black, tarry stools; red or dark-brown urine; spitting up blood or brown material that looks like coffee grounds; red spots on the skin; unusual bruising or bleeding from the eye, gums, or nose  signs and symptoms of kidney injury like trouble passing urine or change in the amount of urine  signs and symptoms of liver injury like dark yellow or brown urine; general ill feeling or flu-like symptoms; light-colored stools; loss of appetite; nausea; right upper belly pain; unusually weak  or tired; yellowing of the eyes or skin  stiff neck  vomiting Side effects that usually do not require medical attention (report to your doctor or health care professional if they continue or are bothersome):  dizziness  hair loss  headache  stomach pain  upset stomach This list may not describe all possible side effects. Call your doctor for medical advice about side effects. You may report side effects to FDA at 1-800-FDA-1088. Where should I keep my medicine? If you are using this medicine at home, you will be instructed on how to store this medicine. Throw away any unused medicine after the expiration date on the label. NOTE:  This sheet is a summary. It may not cover all possible information. If you have questions about this medicine, talk to your doctor, pharmacist, or health care provider.  2020 Elsevier/Gold Standard (2016-11-30 13:31:42)   Lumbar Puncture, Care After This sheet gives you information about how to care for yourself after your procedure. Your health care provider may also give you more specific instructions. If you have problems or questions, contact your health care provider. What can I expect after the procedure? After the procedure, it is common to have:  Mild discomfort or pain at the puncture site.  A mild headache that is relieved with pain medicines. Follow these instructions at home: Activity  Lie down flat or rest for as long as directed by your health care provider.  Return to your normal activities as told by your health care provider. Ask your health care provider what activities are safe for you.  Avoid lifting anything heavier than 10 lb (4.5 kg) for at least 12 hours after the procedure.  Do not drive for 24 hours if you were given a medicine to help you relax (sedative) during your procedure.  Do not drive or use heavy machinery while taking prescription pain medicine. Puncture site care  Remove or change your bandage (dressing) as told by your health care provider.  Check your puncture area every day for signs of infection. Check for: ? More pain. ? Redness or swelling. ? Fluid or blood leaking from the puncture site. ? Warmth. ? Pus or a bad smell. General instructions  Take over-the-counter and prescription medicines only as told by your health care provider.  Drink enough fluids to keep your urine clear or pale yellow. Your health care provider may recommend drinking caffeine to prevent a headache.  Keep all follow-up visits as told by your health care provider. This is important. Contact a health care provider if:  You have fever or chills.  You have  nausea or vomiting.  You have a headache that lasts for more than 2 days or does not get better with medicine. Get help right away if:  You develop any of the following in your legs: ? Weakness. ? Numbness. ? Tingling.  You are unable to control when you urinate or have a bowel movement (incontinence).  You have signs of infection around your puncture site, such as: ? More pain. ? Redness or swelling. ? Fluid or blood leakage. ? Warmth. ? Pus or a bad smell.  You are dizzy or you feel like you might faint.  You have a severe headache, especially when you sit or stand. Summary  A lumbar puncture is a procedure in which a small needle is inserted into the lower back to remove fluid that surrounds the brain and spinal cord.  After this procedure, it is common to have a headache  and pain around the needle insertion area.  Lying flat, staying hydrated, and drinking caffeine can help prevent headaches.  Monitor your needle insertion site for signs of infection, including warmth, fluid, or more pain.  Get help right away if you develop leg weakness, leg numbness, incontinence, or severe headaches. This information is not intended to replace advice given to you by your health care provider. Make sure you discuss any questions you have with your health care provider. Document Revised: 05/24/2016 Document Reviewed: 05/24/2016 Elsevier Patient Education  2020 [employer].

## 2019-12-08 NOTE — Progress Notes (Unsigned)
.  rm °

## 2019-12-08 NOTE — Procedures (Signed)
L2-3 interspace accessed without difficulty. 11cc of csf was removed for requested testing. Methotrexate/steroids were administered intrathecally and needle removed.

## 2019-12-08 NOTE — Progress Notes (Signed)
Accessing Port was very painful for Pt.  Explained that we could potentially access IV with peripheral vein if required on subsequent injections.  Also mentioned taking oral Zofran prior if necessary. Pt States "I'm sorry I just feel violated at this point" referring to all he had been through with medical care this year.  Emotional support given.

## 2019-12-09 ENCOUNTER — Other Ambulatory Visit: Payer: Self-pay | Admitting: Hematology

## 2019-12-09 DIAGNOSIS — C8339 Diffuse large B-cell lymphoma, extranodal and solid organ sites: Secondary | ICD-10-CM

## 2019-12-09 NOTE — Telephone Encounter (Signed)
Please review for refill.  

## 2019-12-10 ENCOUNTER — Other Ambulatory Visit: Payer: Self-pay | Admitting: Hematology

## 2019-12-10 DIAGNOSIS — C8339 Diffuse large B-cell lymphoma, extranodal and solid organ sites: Secondary | ICD-10-CM

## 2019-12-10 LAB — CYTOLOGY - NON PAP

## 2019-12-12 ENCOUNTER — Inpatient Hospital Stay: Payer: Medicare Other | Attending: Hematology

## 2019-12-12 ENCOUNTER — Inpatient Hospital Stay: Payer: Medicare Other | Admitting: Hematology

## 2019-12-12 ENCOUNTER — Other Ambulatory Visit: Payer: Self-pay

## 2019-12-12 ENCOUNTER — Inpatient Hospital Stay: Payer: Medicare Other

## 2019-12-12 VITALS — BP 99/56 | HR 76 | Temp 98.1°F | Resp 16 | Wt 207.0 lb

## 2019-12-12 DIAGNOSIS — Z5111 Encounter for antineoplastic chemotherapy: Secondary | ICD-10-CM

## 2019-12-12 DIAGNOSIS — C8335 Diffuse large B-cell lymphoma, lymph nodes of inguinal region and lower limb: Secondary | ICD-10-CM | POA: Insufficient documentation

## 2019-12-12 DIAGNOSIS — I700 Atherosclerosis of aorta: Secondary | ICD-10-CM | POA: Diagnosis not present

## 2019-12-12 DIAGNOSIS — M1990 Unspecified osteoarthritis, unspecified site: Secondary | ICD-10-CM | POA: Diagnosis not present

## 2019-12-12 DIAGNOSIS — Z5112 Encounter for antineoplastic immunotherapy: Secondary | ICD-10-CM | POA: Diagnosis not present

## 2019-12-12 DIAGNOSIS — C8339 Diffuse large B-cell lymphoma, extranodal and solid organ sites: Secondary | ICD-10-CM

## 2019-12-12 DIAGNOSIS — I10 Essential (primary) hypertension: Secondary | ICD-10-CM | POA: Insufficient documentation

## 2019-12-12 DIAGNOSIS — M5126 Other intervertebral disc displacement, lumbar region: Secondary | ICD-10-CM | POA: Insufficient documentation

## 2019-12-12 DIAGNOSIS — Z95828 Presence of other vascular implants and grafts: Secondary | ICD-10-CM

## 2019-12-12 DIAGNOSIS — K5792 Diverticulitis of intestine, part unspecified, without perforation or abscess without bleeding: Secondary | ICD-10-CM | POA: Insufficient documentation

## 2019-12-12 DIAGNOSIS — Z79899 Other long term (current) drug therapy: Secondary | ICD-10-CM | POA: Diagnosis not present

## 2019-12-12 DIAGNOSIS — E119 Type 2 diabetes mellitus without complications: Secondary | ICD-10-CM | POA: Diagnosis not present

## 2019-12-12 DIAGNOSIS — C83398 Diffuse large b-cell lymphoma of other extranodal and solid organ sites: Secondary | ICD-10-CM

## 2019-12-12 DIAGNOSIS — F419 Anxiety disorder, unspecified: Secondary | ICD-10-CM | POA: Insufficient documentation

## 2019-12-12 DIAGNOSIS — N183 Chronic kidney disease, stage 3 unspecified: Secondary | ICD-10-CM | POA: Insufficient documentation

## 2019-12-12 DIAGNOSIS — F439 Reaction to severe stress, unspecified: Secondary | ICD-10-CM | POA: Diagnosis not present

## 2019-12-12 LAB — CMP (CANCER CENTER ONLY)
ALT: 24 U/L (ref 0–44)
AST: 16 U/L (ref 15–41)
Albumin: 3.7 g/dL (ref 3.5–5.0)
Alkaline Phosphatase: 99 U/L (ref 38–126)
Anion gap: 9 (ref 5–15)
BUN: 26 mg/dL — ABNORMAL HIGH (ref 8–23)
CO2: 23 mmol/L (ref 22–32)
Calcium: 10.2 mg/dL (ref 8.9–10.3)
Chloride: 105 mmol/L (ref 98–111)
Creatinine: 1.3 mg/dL — ABNORMAL HIGH (ref 0.61–1.24)
GFR, Est AFR Am: >60 mL/min (ref >60)
GFR, Estimated: 55 mL/min — ABNORMAL LOW (ref >60)
Glucose, Bld: 234 mg/dL — ABNORMAL HIGH (ref 70–99)
Potassium: 4.2 mmol/L (ref 3.5–5.1)
Sodium: 137 mmol/L (ref 135–145)
Total Bilirubin: 0.3 mg/dL (ref 0.3–1.2)
Total Protein: 6.6 g/dL (ref 6.5–8.1)

## 2019-12-12 LAB — CBC WITH DIFFERENTIAL/PLATELET
Abs Immature Granulocytes: 0.07 10*3/uL (ref 0.00–0.07)
Basophils Absolute: 0 10*3/uL (ref 0.0–0.1)
Basophils Relative: 0 %
Eosinophils Absolute: 0 10*3/uL (ref 0.0–0.5)
Eosinophils Relative: 0 %
HCT: 29.5 % — ABNORMAL LOW (ref 39.0–52.0)
Hemoglobin: 9.9 g/dL — ABNORMAL LOW (ref 13.0–17.0)
Immature Granulocytes: 1 %
Lymphocytes Relative: 10 %
Lymphs Abs: 1 10*3/uL (ref 0.7–4.0)
MCH: 30.6 pg (ref 26.0–34.0)
MCHC: 33.6 g/dL (ref 30.0–36.0)
MCV: 91 fL (ref 80.0–100.0)
Monocytes Absolute: 0.3 10*3/uL (ref 0.1–1.0)
Monocytes Relative: 3 %
Neutro Abs: 8.7 10*3/uL — ABNORMAL HIGH (ref 1.7–7.7)
Neutrophils Relative %: 86 %
Platelets: 416 10*3/uL — ABNORMAL HIGH (ref 150–400)
RBC: 3.24 MIL/uL — ABNORMAL LOW (ref 4.22–5.81)
RDW: 16.6 % — ABNORMAL HIGH (ref 11.5–15.5)
WBC: 10.1 10*3/uL (ref 4.0–10.5)
nRBC: 0 % (ref 0.0–0.2)

## 2019-12-12 MED ORDER — DIPHENHYDRAMINE HCL 25 MG PO CAPS
ORAL_CAPSULE | ORAL | Status: AC
  Filled 2019-12-12: qty 2

## 2019-12-12 MED ORDER — SODIUM CHLORIDE 0.9% FLUSH
10.0000 mL | Freq: Once | INTRAVENOUS | Status: AC
  Administered 2019-12-12: 10 mL
  Filled 2019-12-12: qty 10

## 2019-12-12 MED ORDER — DOXORUBICIN HCL CHEMO IV INJECTION 2 MG/ML
50.0000 mg/m2 | Freq: Once | INTRAVENOUS | Status: AC
  Administered 2019-12-12: 112 mg via INTRAVENOUS
  Filled 2019-12-12: qty 56

## 2019-12-12 MED ORDER — ACETAMINOPHEN 325 MG PO TABS
650.0000 mg | ORAL_TABLET | Freq: Once | ORAL | Status: AC
  Administered 2019-12-12: 650 mg via ORAL

## 2019-12-12 MED ORDER — SODIUM CHLORIDE 0.9 % IV SOLN
10.0000 mg | Freq: Once | INTRAVENOUS | Status: AC
  Administered 2019-12-12: 10 mg via INTRAVENOUS
  Filled 2019-12-12: qty 10

## 2019-12-12 MED ORDER — DIPHENHYDRAMINE HCL 25 MG PO CAPS
50.0000 mg | ORAL_CAPSULE | Freq: Once | ORAL | Status: AC
  Administered 2019-12-12: 50 mg via ORAL

## 2019-12-12 MED ORDER — HEPARIN SOD (PORK) LOCK FLUSH 100 UNIT/ML IV SOLN
500.0000 [IU] | Freq: Once | INTRAVENOUS | Status: DC | PRN
  Filled 2019-12-12: qty 5

## 2019-12-12 MED ORDER — PALONOSETRON HCL INJECTION 0.25 MG/5ML
0.2500 mg | Freq: Once | INTRAVENOUS | Status: AC
  Administered 2019-12-12: 0.25 mg via INTRAVENOUS

## 2019-12-12 MED ORDER — PALONOSETRON HCL INJECTION 0.25 MG/5ML
INTRAVENOUS | Status: AC
  Filled 2019-12-12: qty 5

## 2019-12-12 MED ORDER — FAMOTIDINE IN NACL 20-0.9 MG/50ML-% IV SOLN
INTRAVENOUS | Status: AC
  Filled 2019-12-12: qty 50

## 2019-12-12 MED ORDER — ACETAMINOPHEN 325 MG PO TABS
ORAL_TABLET | ORAL | Status: AC
  Filled 2019-12-12: qty 2

## 2019-12-12 MED ORDER — VINCRISTINE SULFATE CHEMO INJECTION 1 MG/ML
2.0000 mg | Freq: Once | INTRAVENOUS | Status: AC
  Administered 2019-12-12: 2 mg via INTRAVENOUS
  Filled 2019-12-12: qty 2

## 2019-12-12 MED ORDER — FAMOTIDINE IN NACL 20-0.9 MG/50ML-% IV SOLN
20.0000 mg | Freq: Once | INTRAVENOUS | Status: AC
  Administered 2019-12-12: 20 mg via INTRAVENOUS

## 2019-12-12 MED ORDER — SODIUM CHLORIDE 0.9 % IV SOLN
Freq: Once | INTRAVENOUS | Status: AC
  Filled 2019-12-12: qty 250

## 2019-12-12 MED ORDER — SODIUM CHLORIDE 0.9 % IV SOLN
150.0000 mg | Freq: Once | INTRAVENOUS | Status: AC
  Administered 2019-12-12: 150 mg via INTRAVENOUS
  Filled 2019-12-12: qty 150

## 2019-12-12 MED ORDER — SODIUM CHLORIDE 0.9% FLUSH
10.0000 mL | INTRAVENOUS | Status: DC | PRN
  Filled 2019-12-12: qty 10

## 2019-12-12 MED ORDER — SODIUM CHLORIDE 0.9 % IV SOLN
500.0000 mg/m2 | Freq: Once | INTRAVENOUS | Status: AC
  Administered 2019-12-12: 1120 mg via INTRAVENOUS
  Filled 2019-12-12: qty 56

## 2019-12-12 MED ORDER — SODIUM CHLORIDE 0.9 % IV SOLN
375.0000 mg/m2 | Freq: Once | INTRAVENOUS | Status: AC
  Administered 2019-12-12: 800 mg via INTRAVENOUS
  Filled 2019-12-12: qty 50

## 2019-12-12 NOTE — Patient Instructions (Signed)
[facility] Discharge Instructions for Patients Receiving Chemotherapy  Today you received the following chemotherapy agents: Doxorubicin, Vincristine, Cytoxan, and immunotherapy agent: Rituximab  To help prevent nausea and vomiting after your treatment, we encourage you to take your nausea medication as directed by your MD.   If you develop nausea and vomiting that is not controlled by your nausea medication, call the clinic.   BELOW ARE SYMPTOMS THAT SHOULD BE REPORTED IMMEDIATELY:  *FEVER GREATER THAN 100.5 F  *CHILLS WITH OR WITHOUT FEVER  NAUSEA AND VOMITING THAT IS NOT CONTROLLED WITH YOUR NAUSEA MEDICATION  *UNUSUAL SHORTNESS OF BREATH  *UNUSUAL BRUISING OR BLEEDING  TENDERNESS IN MOUTH AND THROAT WITH OR WITHOUT PRESENCE OF ULCERS  *URINARY PROBLEMS  *BOWEL PROBLEMS  UNUSUAL RASH Items with * indicate a potential emergency and should be followed up as soon as possible.  Feel free to call the clinic should you have any questions or concerns. The clinic phone number is [phone].  Please show the [place] at check-in to the Emergency Department and triage nurse.

## 2019-12-12 NOTE — Patient Instructions (Signed)

## 2019-12-12 NOTE — Progress Notes (Signed)
HEMATOLOGY/ONCOLOGY CONSULTATION NOTE  Date of Service: 12/12/2019  Patient Care Team: [name], L.[name], MD as PCP - General (Family Medicine)  CHIEF COMPLAINTS/PURPOSE OF CONSULTATION:  Primary Testicular large B cell lymphoma   HISTORY OF PRESENTING ILLNESS:   Patrick Bautista is a wonderful 69 y.o. male who has been referred to [place] by Dr. 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 Pt is accompanied today by his girlfriend, Patrick Bautista. The pt reports that he is doing well overall.    Pt received his second COVID19 vaccine on 02/13. Soon after he began having sweating in his groin, numbness in his left thigh, and numbness in two fingers on his left hand. After the symptoms began pt went to see his PCP who thought he had a hydrocele. He returned a month later, with continued symptoms, and received a testicular US which found a mass. Pt had a left Orchiectomy performed on 09/03/2019. The pt reports that he saw Dr. Louis Meckel this morning and has been healing well from his surgery.   His Diabetes has been relatively well-controlled with diet and Glimepiride. It has been many years since he has had concerns with Diverticulitis and last had kidney stones in 2017. He has previously been seen by a Nephrologist who felt that his kidney disease could have been partially caused by Cystoscopy with Dr. Louis Meckel in 2018. Pt has no history of lung or heart dysfunction or neuropathy.   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   Of note prior to the patient's visit today, pt has had Left Testicle and Spermatic Cord Surgical Pathology ([id number]) completed on 09/03/2019 with results revealing "Diffuse large B-cell lymphoma."   Pt has had CT C/A/P completed on 08/26/2019 with results revealing "1. Large left-sided testicular mass consistent with known testicular cancer. 2. No findings for metastatic disease involving the chest, abdomen, or pelvis.  3. A few small scattered retroperitoneal lymph nodes but no mass or overt adenopathy. "  Most recent lab results (09/01/2019) of CBC w/diff and CMP is as follows: all values are WNL except for Hgb at 12.7, Glucose at 177, BUN at 50, Creatinine at 1.91, GFR Est Non Af Am at 41.  On review of systems, pt reports anxiety, stress and denies neuropathy, right testicular pain/swelling, abdominal pain and any other symptoms.   On PMHx the pt reports HTN, Diabetes Type II, Diverticulitis, Arthritis, Lumbar herniated disc, Left Orchiectomy. On Social Hx the pt reports that he is a retired Agricultural consultant.   INTERVAL HISTORY:  Patrick Bautista is a wonderful 69 y.o. 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 He is here for C4D1 R-CHOP. The patient's last visit with [place] was on 11/19/2019. The pt reports that he is doing well overall.  The pt reports that he had significant pain with his first IT MTX that improved after he was properly numbed. He was weak for three days following the IT MTX. Pt had some fatigue and constipation after his last treatment, which has improved.   Of note since the patient's last visit, pt has had Cytology Report ([id number]) completed on 12/08/2019 with results revealing " - No malignant cells identified - Benign lymphocytes."  Lab results today (12/12/19) of CBC w/diff and CMP is as follows: all values are WNL except for RBC at 3.24, Hgb at 9.9, HCT at 29.5, RDW at 16.6, PLT at 416K, Neutro Abs at 8.7K, Glucose at 234, BUN  at 26, Creatinine at 1.30, GFR Est Non Af Am at 55.  On review of systems, pt denies headaches, back pain, unexpected weight loss, fatigue, constipation, weakness, abdominal pain and any other symptoms.   MEDICAL HISTORY:  Past Medical History:  Diagnosis Date  . Arthritis   . Diabetes mellitus without complication ([place])    type 2  . Diverticulitis 15 yrs ago  . History of kidney stones    right ureteral stone and  one in 2017  . Hypertension   . Lumbar herniated disc    L 4 L 5     SURGICAL HISTORY: Past Surgical History:  Procedure Laterality Date  . colonscopy  15 yrs ago  . CYSTOSCOPY/URETEROSCOPY/HOLMIUM LASER/STENT PLACEMENT Right 02/16/2017   Procedure: CYSTOSCOPY/RETROGRADE/URETEROSCOPY/HOLMIUM LASER/STENT PLACEMENT;  Surgeon: Ardis Hughs, MD;  Location: [facility];  Service: Urology;  Laterality: Right;  NEEDS 60 MIN FOR PROCEDURE  . IR IMAGING GUIDED PORT INSERTION  09/19/2019  . MOUTH SURGERY  20 yrs ago  . ORCHIECTOMY Left 09/03/2019   Procedure: LEFT ORCHIECTOMY;  Surgeon: Ardis Hughs, MD;  Location: [facility];  Service: Urology;  Laterality: Left;    SOCIAL HISTORY: Social History   Socioeconomic History  . Marital status: Legally Separated    Spouse name: Not on file  . Number of children: Not on file  . Years of education: Not on file  . Highest education level: Not on file  Occupational History  . Not on file  Tobacco Use  . Smoking status: Never Smoker  . Smokeless tobacco: Never Used  Vaping Use  . Vaping Use: Never used  Substance and Sexual Activity  . Alcohol use: No  . Drug use: No  . Sexual activity: Not on file  Other Topics Concern  . Not on file  Social History Narrative   Pt states "Potential reaction after covid vaccination" States "Experienced left fingers numb and left inner thigh numb, left testicular sweating, all within 5 hours of vaccination".   Social Determinants of Health   Financial Resource Strain:   . Difficulty of Paying Living Expenses: Not on file  Food Insecurity:   . Worried About Charity fundraiser in the Last Year: Not on file  . Ran Out of Food in the Last Year: Not on file  Transportation Needs:   . Lack of Transportation (Medical): Not on file  . Lack of Transportation (Non-Medical): Not on file  Physical Activity:   . Days of Exercise per Week: Not on file  . Minutes of Exercise per Session: Not on file  Stress:    . Feeling of Stress : Not on file  Social Connections:   . Frequency of Communication with Friends and Family: Not on file  . Frequency of Social Gatherings with Friends and Family: Not on file  . Attends Religious Services: Not on file  . Active Member of Clubs or Organizations: Not on file  . Attends Archivist Meetings: Not on file  . Marital Status: Not on file  Intimate Partner Violence:   . Fear of Current or Ex-Partner: Not on file  . Emotionally Abused: Not on file  . Physically Abused: Not on file  . Sexually Abused: Not on file    FAMILY HISTORY: No family history on file.  ALLERGIES:  is allergic to hydrocodone, percocet [oxycodone-acetaminophen], codeine, penicillins, and tape.  MEDICATIONS:  Current Outpatient Medications  Medication Sig Dispense Refill  . acetaminophen (TYLENOL) 500 MG tablet Take 500-1,000 mg  by mouth every 6 (six) hours as needed (pain.).     Marland Kitchen ALPRAZolam (XANAX) 0.25 MG tablet Take 0.25 mg by mouth at bedtime as needed for sleep.    Marland Kitchen amLODipine (NORVASC) 2.5 MG tablet Take 2.5 mg by mouth daily. 2.5 mg + 5 mg =7.5 mg total daily dose    . amLODipine (NORVASC) 5 MG tablet Take 5 mg by mouth daily. 2.5 mg + 5 mg =7.5 mg total daily dose  11  . B-D UF III MINI PEN NEEDLES 31G X 5 MM MISC Inject into the skin 4 (four) times daily.    . citalopram (CELEXA) 10 MG tablet TAKE 1 TABLET BY MOUTH EVERY DAY 90 tablet 1  . ergocalciferol (VITAMIN D2) 1.25 MG (50000 UT) capsule Take 1 capsule (50,000 Units total) by mouth 2 (two) times a week. 24 capsule 2  . fluocinonide cream (LIDEX) 0.17 % Apply 1 application topically 2 (two) times daily as needed (skin irritation.).    Marland Kitchen glimepiride (AMARYL) 1 MG tablet Take 1 mg by mouth daily.  12  . HUMALOG KWIKPEN 100 UNIT/ML KwikPen     . lidocaine-prilocaine (EMLA) cream Apply to affected area once 30 g 3  . LORazepam (ATIVAN) 0.5 MG tablet TAKE 1 TABLET BY MOUTH EVERY 6 HOURS AS NEEDED (NAUSEA OR  VOMITING). 30 tablet 0  . olmesartan-hydrochlorothiazide (BENICAR HCT) 40-25 MG tablet Take 1 tablet by mouth daily.    . ondansetron (ZOFRAN) 8 MG tablet Take 1 tablet (8 mg total) by mouth 2 (two) times daily as needed for refractory nausea / vomiting. Start on day 3 after cyclophosphamide chemotherapy. 30 tablet 1  . ONETOUCH VERIO test strip 2 (two) times daily.    . pantoprazole (PROTONIX) 40 MG tablet TAKE 1 TABLET BY MOUTH DAILY BEFORE BREAKFAST 30 tablet 1  . predniSONE (DELTASONE) 20 MG tablet Take 3 tablets (60 mg total) by mouth daily with breakfast. Take with food on days 1-5 of chemotherapy. 15 tablet 5  . prochlorperazine (COMPAZINE) 10 MG tablet Take 1 tablet (10 mg total) by mouth every 6 (six) hours as needed (Nausea or vomiting). 30 tablet 6  . senna-docusate (SENNA S) 8.6-50 MG tablet Take 2 tablets by mouth at bedtime as needed for mild constipation or moderate constipation. 60 tablet 1  . traMADol (ULTRAM) 50 MG tablet Take 1-2 tablets (50-100 mg total) by mouth every 6 (six) hours as needed for moderate pain. 15 tablet 0  . TRESIBA FLEXTOUCH 100 UNIT/ML FlexTouch Pen      No current facility-administered medications for this visit.   Facility-Administered Medications Ordered in Other Visits  Medication Dose Route Frequency Provider Last Rate Last Admin  . cyclophosphamide (CYTOXAN) 1,120 mg in sodium chloride 0.9 % 250 mL chemo infusion  500 mg/m2 (Treatment Plan Recorded) Intravenous Once Brunetta Genera, MD      . DOXOrubicin (ADRIAMYCIN) chemo injection 112 mg  50 mg/m2 (Treatment Plan Recorded) Intravenous Once Brunetta Genera, MD      . fosaprepitant (EMEND) 150 mg in sodium chloride 0.9 % 145 mL IVPB  150 mg Intravenous Once Brunetta Genera, MD 450 mL/hr at 12/12/19 1103 150 mg at 12/12/19 1103  . heparin lock flush 100 unit/mL  500 Units Intracatheter Once PRN Brunetta Genera, MD      . riTUXimab-pvvr (RUXIENCE) 800 mg in sodium chloride 0.9 % 250  mL (2.4242 mg/mL) infusion  375 mg/m2 (Treatment Plan Recorded) Intravenous Once Brunetta Genera, MD      .  sodium chloride flush (NS) 0.9 % injection 10 mL  10 mL Intracatheter PRN Brunetta Genera, MD      . vinCRIStine (ONCOVIN) 2 mg in sodium chloride 0.9 % 50 mL chemo infusion  2 mg Intravenous Once Irene Limbo Cloria Spring, MD        REVIEW OF SYSTEMS:   A 10+ POINT REVIEW OF SYSTEMS WAS OBTAINED including neurology, dermatology, psychiatry, cardiac, respiratory, lymph, extremities, GI, GU, Musculoskeletal, constitutional, breasts, reproductive, HEENT.  All pertinent positives are noted in the HPI.  All others are negative.   PHYSICAL EXAMINATION: ECOG PERFORMANCE STATUS: 0 - Asymptomatic  . There were no vitals filed for this visit. There were no vitals filed for this visit. .There is no height or weight on file to calculate BMI.   Exam was given in a chair   GENERAL:alert, in no acute distress and comfortable SKIN: no acute rashes, no significant lesions EYES: conjunctiva are pink and non-injected, sclera anicteric OROPHARYNX: MMM, no exudates, no oropharyngeal erythema or ulceration NECK: supple, no JVD LYMPH:  no palpable lymphadenopathy in the cervical, axillary or inguinal regions LUNGS: clear to auscultation b/l with normal respiratory effort HEART: regular rate & rhythm ABDOMEN:  normoactive bowel sounds , non tender, not distended. No palpable hepatosplenomegaly.  Extremity: no pedal edema PSYCH: alert & oriented x 3 with fluent speech NEURO: no focal motor/sensory deficits  LABORATORY DATA:  I have reviewed the data as listed  . CBC Latest Ref Rng & Units 12/12/2019 11/19/2019 10/31/2019  WBC 4.0 - 10.5 K/uL 10.1 11.3(H) 8.0  Hemoglobin 13.0 - 17.0 g/dL 9.9(L) 10.5(L) 11.2(L)  Hematocrit 39 - 52 % 29.5(L) 31.3(L) 34.5(L)  Platelets 150 - 400 K/uL 416(H) 321 323    . CMP Latest Ref Rng & Units 12/12/2019 11/19/2019 10/31/2019  Glucose 70 - 99 mg/dL 234(H)  240(H) 283(H)  BUN 8 - 23 mg/dL 26(H) 29(H) 28(H)  Creatinine 0.61 - 1.24 mg/dL 1.30(H) 1.65(H) 1.52(H)  Sodium 135 - 145 mmol/L 137 138 137  Potassium 3.5 - 5.1 mmol/L 4.2 4.3 5.0  Chloride 98 - 111 mmol/L 105 105 106  CO2 22 - 32 mmol/L 23 23 23   Calcium 8.9 - 10.3 mg/dL 10.2 10.4(H) 9.8  Total Protein 6.5 - 8.1 g/dL 6.6 7.0 7.2  Total Bilirubin 0.3 - 1.2 mg/dL 0.3 <0.2(L) 0.2(L)  Alkaline Phos 38 - 126 U/L 99 107 95  AST 15 - 41 U/L 16 16 19   ALT 0 - 44 U/L 24 18 24     12/08/2019 Cytology Report ([id number]):   09/03/2019 Left Testicle and Spermatic Cord Surgical Pathology ([id number]) :    RADIOGRAPHIC STUDIES: I have personally reviewed the radiological images as listed and agreed with the findings in the report. DG FLUORO GUIDED LOC OF NEEDLE/CATH TIP FOR SPINAL INJECT LT  Result Date: 12/08/2019 CLINICAL DATA:  History of primary testicular large B-cell lymphoma EXAM: DIAGNOSTIC LUMBAR PUNCTURE UNDER FLUOROSCOPIC GUIDANCE FLUOROSCOPY TIME:  Fluoroscopy Time:  44 seconds Radiation Exposure Index (if provided by the fluoroscopic device): 5.6 mGy Number of Acquired Spot Images: 0 PROCEDURE: Informed consent was obtained from the patient prior to the procedure, including potential complications of headache, allergy, and pain. A time-out was performed to confirm the procedure and to confirm the labels on patient medication matched the arm band on the patient. With the patient prone, the lower back was prepped with Betadine. 1% Lidocaine was used for local anesthesia. Lumbar puncture was performed at the L2-3 level using a 3.5 inch  22 gauge needle with return of clear CSF with grossly normal opening pressure. 11 ml of CSF were obtained for laboratory studies. Subsequently, intrathecal methotrexate and Solu-Medrol as ordered by the referring physician (12 mg methotrexate and 50 mg of Solu-Cortef) was injected after confirmation of continued CSF flow. The patient tolerated the  procedure well and there were no apparent complications. IMPRESSION: 1. Technically successful delivery of intrathecal methotrexate and Solu-Cortef as outlined above. The patient was returned to the floor with postprocedure orders. Electronically Signed   By: Zetta Bills M.D.   On: 12/08/2019 14:09    ASSESSMENT & PLAN:   68 yo with   1) Newly diagnosed left primary testicular large B cell lymphoma 09/03/2019 Left Testicle and Spermatic Cord Surgical Pathology ([id number]) revealed "Diffuse large B-cell lymphoma."  08/26/2019 CT C/A/P revealed "1. Large left-sided testicular mass consistent with known testicular cancer. 2. No findings for metastatic disease involving the chest, abdomen, or pelvis. 3. A few small scattered retroperitoneal lymph nodes but no mass or overt adenopathy. " 09/30/2019 PET/CT ([id number]) revealed "1. Small right adrenal nodule exhibits intense tracer uptake with SUV max of 13.03. Compared with the study from 08/26/2019 this appears to represent a new finding. This nodule has mean Hounsfield units of 31.5 compared with mean Hounsfield units of 1.44 on 08/26/2019. Cannot rule out right adrenal gland involvement. 2. No FDG avid (Deauville criteria 3 or greater) lymph nodes or mass identified within the remainder of the neck, chest, abdomen or pelvis. 3. Aortic atherosclerosis, in addition to RCA and LAD coronary artery disease. Please note that although the presence of coronary artery calcium documents the presence of coronary artery disease, the severity of this disease and any potential stenosis cannot be assessed on this non-gated CT examination. Assessment for potential risk factor modification, dietary therapy or pharmacologic therapy may be warranted, if clinically indicated. Aortic Atherosclerosis (ICD10-I70.0)." 2) .Marland Kitchen Past Medical History:  Diagnosis Date  . Arthritis   . Diabetes mellitus without complication ([place])    type 2  . Diverticulitis 15 yrs ago  .  History of kidney stones    right ureteral stone and one in 2017  . Hypertension   . Lumbar herniated disc    L 4 L 5    3) CKD stage 3  PLAN: -Discussed pt labwork today, 12/12/19; blood counts and chemistries are steady, kidney numbers are slightly improved -Discussed 12/08/2019 Cytology Report ([id number]) which revealed " - No malignant cells identified - Benign lymphocytes." -The pt has no prohibitive toxicities from continuing C4 R-CHOP with G-CSF support at this time. -The pt has no prohibiitve toxicities from continuing IT MTX on C4D15.  -Plan to complete up to 6 cycles of chemotherapy, 4 cycles of IT MTX for CNS prophylaxis -Continue 50K Units Vitamin D twice per week -Will see back in 3 [place] with labs    FOLLOW UP: Intrathecal Methotrexate on C4D15 with Interventional Radiology in [address] schedule C5 of R-CHOP (D1 and D3) with portflush, labs and MD visit    The total time spent in the appt was 30 minutes and more than 50% was on counseling and direct patient cares, ordering and management of chemotherapy.  All of the patient's questions were answered with apparent satisfaction. The patient knows to call the clinic with any problems, questions or concerns.   Sullivan Lone MD [place] AAHIVMS [facility] Hematology/Oncology Physician [facility]  (Office):       [phone] (Work cell):  [phone] (Fax):  [phone]  12/12/2019 11:14 AM  I, [name], am acting as a scribe for Dr. Sullivan Lone.   .I have reviewed the above documentation for accuracy and completeness, and I agree with the above. Brunetta Genera MD

## 2019-12-15 ENCOUNTER — Other Ambulatory Visit: Payer: Self-pay

## 2019-12-15 ENCOUNTER — Inpatient Hospital Stay: Payer: Medicare Other

## 2019-12-15 VITALS — BP 135/115 | HR 83 | Resp 18

## 2019-12-15 DIAGNOSIS — Z5112 Encounter for antineoplastic immunotherapy: Secondary | ICD-10-CM | POA: Diagnosis not present

## 2019-12-15 DIAGNOSIS — C8339 Diffuse large B-cell lymphoma, extranodal and solid organ sites: Secondary | ICD-10-CM

## 2019-12-15 MED ORDER — PEGFILGRASTIM-CBQV 6 MG/0.6ML ~~LOC~~ SOSY
PREFILLED_SYRINGE | SUBCUTANEOUS | Status: AC
  Filled 2019-12-15: qty 0.6

## 2019-12-15 MED ORDER — PEGFILGRASTIM-CBQV 6 MG/0.6ML ~~LOC~~ SOSY
6.0000 mg | PREFILLED_SYRINGE | Freq: Once | SUBCUTANEOUS | Status: AC
  Administered 2019-12-15: 6 mg via SUBCUTANEOUS

## 2019-12-15 NOTE — Patient Instructions (Signed)

## 2019-12-16 ENCOUNTER — Other Ambulatory Visit: Payer: Self-pay | Admitting: Hematology

## 2019-12-16 DIAGNOSIS — C8339 Diffuse large B-cell lymphoma, extranodal and solid organ sites: Secondary | ICD-10-CM

## 2019-12-16 NOTE — Progress Notes (Signed)
IR orders for intrathecal methotrexate for 12/26/2019

## 2019-12-19 ENCOUNTER — Telehealth: Payer: Self-pay | Admitting: Hematology

## 2019-12-19 NOTE — Telephone Encounter (Signed)
Scheduled appt per 8/20 los- called to confirm appt with patient. Pt is aware.

## 2019-12-25 ENCOUNTER — Other Ambulatory Visit: Payer: Self-pay | Admitting: Hematology

## 2019-12-25 DIAGNOSIS — C8339 Diffuse large B-cell lymphoma, extranodal and solid organ sites: Secondary | ICD-10-CM

## 2019-12-26 ENCOUNTER — Encounter (HOSPITAL_COMMUNITY): Payer: Self-pay

## 2019-12-26 ENCOUNTER — Ambulatory Visit (HOSPITAL_COMMUNITY)
Admission: RE | Admit: 2019-12-26 | Discharge: 2019-12-26 | Disposition: A | Discharge status: Home / Self Care | Payer: Medicare Other | Source: Ambulatory Visit | Attending: Hematology | Admitting: Hematology

## 2019-12-26 ENCOUNTER — Other Ambulatory Visit: Payer: Self-pay | Admitting: *Deleted

## 2019-12-26 ENCOUNTER — Other Ambulatory Visit: Payer: Self-pay

## 2019-12-26 ENCOUNTER — Ambulatory Visit (HOSPITAL_COMMUNITY)
Admission: RE | Admit: 2019-12-26 | Discharge: 2019-12-26 | Disposition: A | Discharge status: Home / Self Care | Payer: Medicare Other | Source: Ambulatory Visit | Attending: Family Medicine | Admitting: Family Medicine

## 2019-12-26 ENCOUNTER — Telehealth: Payer: Self-pay | Admitting: *Deleted

## 2019-12-26 ENCOUNTER — Ambulatory Visit: Payer: Medicare Other

## 2019-12-26 ENCOUNTER — Other Ambulatory Visit (HOSPITAL_COMMUNITY): Payer: Self-pay | Admitting: Body Imaging

## 2019-12-26 VITALS — BP 116/68 | HR 95 | Temp 98.5°F | Resp 16

## 2019-12-26 DIAGNOSIS — C8339 Diffuse large B-cell lymphoma, extranodal and solid organ sites: Secondary | ICD-10-CM

## 2019-12-26 DIAGNOSIS — Z5111 Encounter for antineoplastic chemotherapy: Secondary | ICD-10-CM | POA: Insufficient documentation

## 2019-12-26 LAB — GLUCOSE, CAPILLARY: Glucose-Capillary: 164 mg/dL — ABNORMAL HIGH (ref 70–99)

## 2019-12-26 MED ORDER — SODIUM CHLORIDE 0.9 % IV SOLN
Freq: Once | INTRAVENOUS | Status: DC
  Filled 2019-12-26: qty 4

## 2019-12-26 MED ORDER — DEXAMETHASONE 4 MG PO TABS
4.0000 mg | ORAL_TABLET | Freq: Once | ORAL | Status: AC
  Administered 2019-12-26: 4 mg via ORAL
  Filled 2019-12-26: qty 1

## 2019-12-26 MED ORDER — ONDANSETRON HCL 8 MG PO TABS
8.0000 mg | ORAL_TABLET | Freq: Once | ORAL | Status: AC
  Administered 2019-12-26: 8 mg via ORAL
  Filled 2019-12-26: qty 1

## 2019-12-26 MED ORDER — SODIUM CHLORIDE (PF) 0.9 % IJ SOLN
Freq: Once | INTRAMUSCULAR | Status: AC
  Filled 2019-12-26: qty 0.48

## 2019-12-26 NOTE — Procedures (Signed)
Lumbar puncture performed at L3-L4 for intrathecal chemotherapy infusion.  12 mg methotrexate administered intrathecally.  No complications.  Please see dictation in PACS for additional details.

## 2019-12-26 NOTE — Discharge Instructions (Signed)
Lumbar Puncture, Care After This sheet gives you information about how to care for yourself after your procedure. Your health care provider may also give you more specific instructions. If you have problems or questions, contact your health care provider. What can I expect after the procedure? After the procedure, it is common to have:  Mild discomfort or pain at the puncture site.  A mild headache that is relieved with pain medicines. Follow these instructions at home: Activity   Lie down flat or rest for as long as directed by your health care provider.  Return to your normal activities as told by your health care provider. Ask your health care provider what activities are safe for you.  Avoid lifting anything heavier than 10 lb (4.5 kg) for at least 12 hours after the procedure.  Do not drive for 24 hours if you were given a medicine to help you relax (sedative) during your procedure.  Do not drive or use heavy machinery while taking prescription pain medicine. Puncture site care  Remove or change your bandage (dressing) as told by your health care provider.  Check your puncture area every day for signs of infection. Check for: ? More pain. ? Redness or swelling. ? Fluid or blood leaking from the puncture site. ? Warmth. ? Pus or a bad smell. General instructions  Take over-the-counter and prescription medicines only as told by your health care provider.  Drink enough fluids to keep your urine clear or pale yellow. Your health care provider may recommend drinking caffeine to prevent a headache.  Keep all follow-up visits as told by your health care provider. This is important. Contact a health care provider if:  You have fever or chills.  You have nausea or vomiting.  You have a headache that lasts for more than 2 days or does not get better with medicine. Get help right away if:  You develop any of the following in your  legs: ? Weakness. ? Numbness. ? Tingling.  You are unable to control when you urinate or have a bowel movement (incontinence).  You have signs of infection around your puncture site, such as: ? More pain. ? Redness or swelling. ? Fluid or blood leakage. ? Warmth. ? Pus or a bad smell.  You are dizzy or you feel like you might faint.  You have a severe headache, especially when you sit or stand. Summary  A lumbar puncture is a procedure in which a small needle is inserted into the lower back to remove fluid that surrounds the brain and spinal cord.  After this procedure, it is common to have a headache and pain around the needle insertion area.  Lying flat, staying hydrated, and drinking caffeine can help prevent headaches.  Monitor your needle insertion site for signs of infection, including warmth, fluid, or more pain.  Get help right away if you develop leg weakness, leg numbness, incontinence, or severe headaches. This information is not intended to replace advice given to you by your health care provider. Make sure you discuss any questions you have with your health care provider. Document Revised: 05/24/2016 Document Reviewed: 05/24/2016 Elsevier Patient Education  2020 [employer]  

## 2019-12-26 NOTE — Telephone Encounter (Signed)
Contacted by [employer] Stay, Angus Palms, RN. She requested order to either access port/start PIV or change to give po the currently ordered premeds. Per Dr. Irene Limbo - verbal order received to give Zofran 8 mg and Decadron 4 mg po 30-45 minutes prior to procedure. Contacted Janelle with this information - med orders in signed and held.

## 2020-01-01 NOTE — Progress Notes (Signed)
HEMATOLOGY/ONCOLOGY CONSULTATION NOTE  Date of Service: 01/02/2020  Patient Care Team: [name], L.[name], MD as PCP - General (Family Medicine)  CHIEF COMPLAINTS/PURPOSE OF CONSULTATION:  Primary Testicular large B cell lymphoma   HISTORY OF PRESENTING ILLNESS:   Patrick Bautista is a wonderful 69 y.o. male who has been referred to [place] by Dr. 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 Pt is accompanied today by his girlfriend, [name]. The pt reports that he is doing well overall.    Pt received his second COVID19 vaccine on 02/13. Soon after he began having sweating in his groin, numbness in his left thigh, and numbness in two fingers on his left hand. After the symptoms began pt went to see his PCP who thought he had a hydrocele. He returned a month later, with continued symptoms, and received a testicular US which found a mass. Pt had a left Orchiectomy performed on 09/03/2019. The pt reports that he saw Dr. Louis Meckel this morning and has been healing well from his surgery.   His Diabetes has been relatively well-controlled with diet and Glimepiride. It has been many years since he has had concerns with Diverticulitis and last had kidney stones in 2017. He has previously been seen by a Nephrologist who felt that his kidney disease could have been partially caused by Cystoscopy with Dr. Louis Meckel in 2018. Pt has no history of lung or heart dysfunction or neuropathy.   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   Of note prior to the patient's visit today, pt has had Left Testicle and Spermatic Cord Surgical Pathology ([id number]) completed on 09/03/2019 with results revealing "Diffuse large B-cell lymphoma."   Pt has had CT C/A/P completed on 08/26/2019 with results revealing "1. Large left-sided testicular mass consistent with known testicular cancer. 2. No findings for metastatic disease involving the chest, abdomen, or pelvis.  3. A few small scattered retroperitoneal lymph nodes but no mass or overt adenopathy. "  Most recent lab results (09/01/2019) of CBC w/diff and CMP is as follows: all values are WNL except for Hgb at 12.7, Glucose at 177, BUN at 50, Creatinine at 1.91, GFR Est Non Af Am at 41.  On review of systems, pt reports anxiety, stress and denies neuropathy, right testicular pain/swelling, abdominal pain and any other symptoms.   On PMHx the pt reports HTN, Diabetes Type II, Diverticulitis, Arthritis, Lumbar herniated disc, Left Orchiectomy. On Social Hx the pt reports that he is a retired Agricultural consultant.   INTERVAL HISTORY:  Patrick Bautista is a wonderful 69 y.o. 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 He is here for C5D1 R-CHOP. The patient's last visit with [place] was on 12/12/2019. The pt reports that he is doing well overall.  The pt reports that his last IT MTX was less painful than the first dose, but needed three tries to be successful. He had intermittent headaches for a few days following his IT MTX. He notes that he his fatigue lingered longer after his most recent treatment. Pt also becomes constipated directly following treatment, but continues using fiber to keep his bowels moving.   Lab results today (01/02/20) of CBC w/diff and CMP is as follows: all values are WNL except for WBC at 11.7K, RBC at 3.35, Hgb at 10.2, HCT at 30.8, RDW at 16.9, Neutro Abs at 10.0K, Abs at Immature Granulocytes at 0.21K, Sodium at 134, Glucose at 213, BUN  at 28, Creatinine at 1.60, AST at 13, GFR Est Non Af Am at 43. 01/02/2020 Magnesium at 1.9 01/02/2020 LDH at 172  On review of systems, pt reports postnasal drip and denies leg swelling, abdominal pain, fevers, chills and any other symptoms.   MEDICAL HISTORY:  Past Medical History:  Diagnosis Date  . Arthritis   . Diabetes mellitus without complication ([place])    type 2  . Diverticulitis 15 yrs ago  . History of kidney  stones    right ureteral stone and one in 2017  . Hypertension   . Lumbar herniated disc    L 4 L 5     SURGICAL HISTORY: Past Surgical History:  Procedure Laterality Date  . colonscopy  15 yrs ago  . CYSTOSCOPY/URETEROSCOPY/HOLMIUM LASER/STENT PLACEMENT Right 02/16/2017   Procedure: CYSTOSCOPY/RETROGRADE/URETEROSCOPY/HOLMIUM LASER/STENT PLACEMENT;  Surgeon: Ardis Hughs, MD;  Location: [facility];  Service: Urology;  Laterality: Right;  NEEDS 60 MIN FOR PROCEDURE  . IR IMAGING GUIDED PORT INSERTION  09/19/2019  . MOUTH SURGERY  20 yrs ago  . ORCHIECTOMY Left 09/03/2019   Procedure: LEFT ORCHIECTOMY;  Surgeon: Ardis Hughs, MD;  Location: [facility];  Service: Urology;  Laterality: Left;    SOCIAL HISTORY: Social History   Socioeconomic History  . Marital status: Legally Separated    Spouse name: Not on file  . Number of children: Not on file  . Years of education: Not on file  . Highest education level: Not on file  Occupational History  . Not on file  Tobacco Use  . Smoking status: Never Smoker  . Smokeless tobacco: Never Used  Vaping Use  . Vaping Use: Never used  Substance and Sexual Activity  . Alcohol use: No  . Drug use: No  . Sexual activity: Not on file  Other Topics Concern  . Not on file  Social History Narrative   Pt states "Potential reaction after covid vaccination" States "Experienced left fingers numb and left inner thigh numb, left testicular sweating, all within 5 hours of vaccination".   Social Determinants of Health   Financial Resource Strain:   . Difficulty of Paying Living Expenses: Not on file  Food Insecurity:   . Worried About Charity fundraiser in the Last Year: Not on file  . Ran Out of Food in the Last Year: Not on file  Transportation Needs:   . Lack of Transportation (Medical): Not on file  . Lack of Transportation (Non-Medical): Not on file  Physical Activity:   . Days of Exercise per Week: Not on file  . Minutes of Exercise  per Session: Not on file  Stress:   . Feeling of Stress : Not on file  Social Connections:   . Frequency of Communication with Friends and Family: Not on file  . Frequency of Social Gatherings with Friends and Family: Not on file  . Attends Religious Services: Not on file  . Active Member of Clubs or Organizations: Not on file  . Attends Archivist Meetings: Not on file  . Marital Status: Not on file  Intimate Partner Violence:   . Fear of Current or Ex-Partner: Not on file  . Emotionally Abused: Not on file  . Physically Abused: Not on file  . Sexually Abused: Not on file    FAMILY HISTORY: No family history on file.  ALLERGIES:  is allergic to hydrocodone, percocet [oxycodone-acetaminophen], codeine, penicillins, and tape.  MEDICATIONS:  Current Outpatient Medications  Medication Sig Dispense Refill  .  loratadine (CLARITIN) 10 MG tablet Take 10 mg by mouth See admin instructions. Patient states he takes for 7 days: from 3rd day post chemo until 10th day post chemo    . acetaminophen (TYLENOL) 500 MG tablet Take 500-1,000 mg by mouth every 6 (six) hours as needed (pain.).     Marland Kitchen ALPRAZolam (XANAX) 0.25 MG tablet Take 0.25 mg by mouth at bedtime as needed for sleep.    Marland Kitchen amLODipine (NORVASC) 2.5 MG tablet Take 2.5 mg by mouth daily. 2.5 mg + 5 mg =7.5 mg total daily dose    . amLODipine (NORVASC) 5 MG tablet Take 5 mg by mouth daily. 2.5 mg + 5 mg =7.5 mg total daily dose  11  . B-D UF III MINI PEN NEEDLES 31G X 5 MM MISC Inject into the skin 4 (four) times daily.    . citalopram (CELEXA) 10 MG tablet TAKE 1 TABLET BY MOUTH EVERY DAY 90 tablet 1  . ergocalciferol (VITAMIN D2) 1.25 MG (50000 UT) capsule Take 1 capsule (50,000 Units total) by mouth 2 (two) times a week. 24 capsule 2  . fluocinonide cream (LIDEX) 6.94 % Apply 1 application topically 2 (two) times daily as needed (skin irritation.).    Marland Kitchen glimepiride (AMARYL) 1 MG tablet Take 1 mg by mouth daily.  12  . HUMALOG  KWIKPEN 100 UNIT/ML KwikPen     . lidocaine-prilocaine (EMLA) cream Apply to affected area once 30 g 3  . LORazepam (ATIVAN) 0.5 MG tablet TAKE 1 TABLET BY MOUTH EVERY 6 HOURS AS NEEDED (NAUSEA OR VOMITING). 30 tablet 0  . olmesartan-hydrochlorothiazide (BENICAR HCT) 40-25 MG tablet Take 1 tablet by mouth daily.    . ondansetron (ZOFRAN) 8 MG tablet Take 1 tablet (8 mg total) by mouth 2 (two) times daily as needed for refractory nausea / vomiting. Start on day 3 after cyclophosphamide chemotherapy. 30 tablet 1  . ONETOUCH VERIO test strip 2 (two) times daily.    . pantoprazole (PROTONIX) 40 MG tablet TAKE 1 TABLET BY MOUTH DAILY BEFORE BREAKFAST 90 tablet 1  . predniSONE (DELTASONE) 20 MG tablet Take 3 tablets (60 mg total) by mouth daily with breakfast. Take with food on days 1-5 of chemotherapy. 15 tablet 5  . prochlorperazine (COMPAZINE) 10 MG tablet Take 1 tablet (10 mg total) by mouth every 6 (six) hours as needed (Nausea or vomiting). 30 tablet 6  . senna-docusate (SENNA S) 8.6-50 MG tablet Take 2 tablets by mouth at bedtime as needed for mild constipation or moderate constipation. 60 tablet 1  . traMADol (ULTRAM) 50 MG tablet Take 1-2 tablets (50-100 mg total) by mouth every 6 (six) hours as needed for moderate pain. 15 tablet 0  . TRESIBA FLEXTOUCH 100 UNIT/ML FlexTouch Pen      No current facility-administered medications for this visit.    REVIEW OF SYSTEMS:   A 10+ POINT REVIEW OF SYSTEMS WAS OBTAINED including neurology, dermatology, psychiatry, cardiac, respiratory, lymph, extremities, GI, GU, Musculoskeletal, constitutional, breasts, reproductive, HEENT.  All pertinent positives are noted in the HPI.  All others are negative.   PHYSICAL EXAMINATION: ECOG PERFORMANCE STATUS: 0 - Asymptomatic  . Vitals:   01/02/20 0915  BP: 99/80  Pulse: (!) 114  Resp: 20  Temp: (!) 97.3 F (36.3 C)  SpO2: 99%   Filed Weights   01/02/20 0915  Weight: 206 lb 4.8 oz (93.6 kg)   .Body  mass index is 29.18 kg/m.   GENERAL:alert, in no acute distress and comfortable  SKIN: no acute rashes, no significant lesions EYES: conjunctiva are pink and non-injected, sclera anicteric OROPHARYNX: MMM, no exudates, no oropharyngeal erythema or ulceration NECK: supple, no JVD LYMPH:  no palpable lymphadenopathy in the cervical, axillary or inguinal regions LUNGS: clear to auscultation b/l with normal respiratory effort HEART: regular rate & rhythm ABDOMEN:  normoactive bowel sounds , non tender, not distended. No palpable hepatosplenomegaly.  Extremity: no pedal edema PSYCH: alert & oriented x 3 with fluent speech NEURO: no focal motor/sensory deficits  LABORATORY DATA:  I have reviewed the data as listed  . CBC Latest Ref Rng & Units 01/02/2020 12/12/2019 11/19/2019  WBC 4.0 - 10.5 K/uL 11.7(H) 10.1 11.3(H)  Hemoglobin 13.0 - 17.0 g/dL 10.2(L) 9.9(L) 10.5(L)  Hematocrit 39 - 52 % 30.8(L) 29.5(L) 31.3(L)  Platelets 150 - 400 K/uL 307 416(H) 321    . CMP Latest Ref Rng & Units 01/02/2020 12/12/2019 11/19/2019  Glucose 70 - 99 mg/dL 213(H) 234(H) 240(H)  BUN 8 - 23 mg/dL 28(H) 26(H) 29(H)  Creatinine 0.61 - 1.24 mg/dL 1.60(H) 1.30(H) 1.65(H)  Sodium 135 - 145 mmol/L 134(L) 137 138  Potassium 3.5 - 5.1 mmol/L 4.2 4.2 4.3  Chloride 98 - 111 mmol/L 101 105 105  CO2 22 - 32 mmol/L 25 23 23   Calcium 8.9 - 10.3 mg/dL 9.9 10.2 10.4(H)  Total Protein 6.5 - 8.1 g/dL 7.2 6.6 7.0  Total Bilirubin 0.3 - 1.2 mg/dL 0.3 0.3 <0.2(L)  Alkaline Phos 38 - 126 U/L 109 99 107  AST 15 - 41 U/L 13(L) 16 16  ALT 0 - 44 U/L 17 24 18     12/08/2019 Cytology Report ([id number]):   09/03/2019 Left Testicle and Spermatic Cord Surgical Pathology ([id number]) :    RADIOGRAPHIC STUDIES: I have personally reviewed the radiological images as listed and agreed with the findings in the report. DG Fluoro Guide Spinal/SI Jt Inj Left  Result Date: 12/26/2019 Etheleen Mayhew, MD     12/26/2019  2:56 PM  Lumbar puncture performed at L3-L4 for intrathecal chemotherapy infusion.  12 mg methotrexate administered intrathecally.  No complications.  Please see dictation in PACS for additional details.   DG FLUORO GUIDED LOC OF NEEDLE/CATH TIP FOR SPINAL INJECT LT  Result Date: 12/26/2019 CLINICAL DATA:  69 year old male with history of testicular large B-cell lymphoma. EXAM: DIAGNOSTIC LUMBAR PUNCTURE UNDER FLUOROSCOPIC GUIDANCE INTRATHECAL CHEMOTHERAPY INJECTION FLUOROSCOPY TIME:  Fluoroscopy Time:  1 minutes and 24 seconds Radiation Exposure Index (if provided by the fluoroscopic device): 13.6 mGy PROCEDURE: Informed consent was obtained from the patient prior to the procedure, including potential complications of headache, allergy, and pain. With the patient prone, the lower back was prepped with Betadine. 1% Lidocaine was used for local anesthesia. Lumbar puncture was performed at the L3-L4 level using a 20 gauge needle with return of clear CSF. No CSF was obtained for laboratory studies. 12 mg of methotrexate was injected intrathecally. The patient tolerated the procedure well and there were no apparent complications. IMPRESSION: 1. Successful uncomplicated fluoroscopic guided lumbar puncture for intrathecal chemotherapy injection, as above. Electronically Signed   By: Vinnie Langton M.D.   On: 12/26/2019 14:43   DG FLUORO GUIDED LOC OF NEEDLE/CATH TIP FOR SPINAL INJECT LT  Result Date: 12/08/2019 CLINICAL DATA:  History of primary testicular large B-cell lymphoma EXAM: DIAGNOSTIC LUMBAR PUNCTURE UNDER FLUOROSCOPIC GUIDANCE FLUOROSCOPY TIME:  Fluoroscopy Time:  44 seconds Radiation Exposure Index (if provided by the fluoroscopic device): 5.6 mGy Number of Acquired Spot Images: 0 PROCEDURE:  Informed consent was obtained from the patient prior to the procedure, including potential complications of headache, allergy, and pain. A time-out was performed to confirm the procedure and to confirm the labels on  patient medication matched the arm band on the patient. With the patient prone, the lower back was prepped with Betadine. 1% Lidocaine was used for local anesthesia. Lumbar puncture was performed at the L2-3 level using a 3.5 inch 22 gauge needle with return of clear CSF with grossly normal opening pressure. 11 ml of CSF were obtained for laboratory studies. Subsequently, intrathecal methotrexate and Solu-Medrol as ordered by the referring physician (12 mg methotrexate and 50 mg of Solu-Cortef) was injected after confirmation of continued CSF flow. The patient tolerated the procedure well and there were no apparent complications. IMPRESSION: 1. Technically successful delivery of intrathecal methotrexate and Solu-Cortef as outlined above. The patient was returned to the floor with postprocedure orders. Electronically Signed   By: Zetta Bills M.D.   On: 12/08/2019 14:09    ASSESSMENT & PLAN:   69 yo with   1) Newly diagnosed left primary testicular large B cell lymphoma 09/03/2019 Left Testicle and Spermatic Cord Surgical Pathology ([id number]) revealed "Diffuse large B-cell lymphoma."  08/26/2019 CT C/A/P revealed "1. Large left-sided testicular mass consistent with known testicular cancer. 2. No findings for metastatic disease involving the chest, abdomen, or pelvis. 3. A few small scattered retroperitoneal lymph nodes but no mass or overt adenopathy. " 09/30/2019 PET/CT ([id number]) revealed "1. Small right adrenal nodule exhibits intense tracer uptake with SUV max of 13.03. Compared with the study from 08/26/2019 this appears to represent a new finding. This nodule has mean Hounsfield units of 31.5 compared with mean Hounsfield units of 1.44 on 08/26/2019. Cannot rule out right adrenal gland involvement. 2. No FDG avid (Deauville criteria 3 or greater) lymph nodes or mass identified within the remainder of the neck, chest, abdomen or pelvis. 3. Aortic atherosclerosis, in addition to RCA and LAD  coronary artery disease. Please note that although the presence of coronary artery calcium documents the presence of coronary artery disease, the severity of this disease and any potential stenosis cannot be assessed on this non-gated CT examination. Assessment for potential risk factor modification, dietary therapy or pharmacologic therapy may be warranted, if clinically indicated. Aortic Atherosclerosis (ICD10-I70.0)." 2) .Marland Kitchen Past Medical History:  Diagnosis Date  . Arthritis   . Diabetes mellitus without complication ([place])    type 2  . Diverticulitis 15 yrs ago  . History of kidney stones    right ureteral stone and one in 2017  . Hypertension   . Lumbar herniated disc    L 4 L 5    3) CKD stage 3  PLAN: -Discussed pt labwork today, 01/02/20; WBC are okay, anemia is stable, PLT is nml, Glucos is elevated but stable, kidney function is stable, Magnesium & LDH is WNL -The pt has no prohibitive toxicities from continuing C5 R-CHOP with G-CSF support at this time. -The pt has no prohibiitve toxicities from continuing IT MTX on C5D15.  -Plan to complete up to 6 cycles of chemotherapy and 4 cycles of IT MTX for CNS prophylaxis.  -Advised pt that we would complete RT to his right testicle after the completion of chemotherapy to prevent recurrence of Testicular DLBCL. -Recommended that the pt continue to eat well, drink at least 48-64 oz of water each day, and walk 20-30 minutes each day. -Advised pt that fatigue can be cumulative, but should improve after treatment. -  Continue 50K Units Vitamin D twice per week -Will see back with C6   FOLLOW UP: -Intrathecal Methotrexate on C4D15 with Interventional Radiology in 2 [place]. -Plz schedule C6 of R-CHOP (D1 and D3) with D1 portflush, labs and MD visit   The total time spent in the appt was 30 minutes and more than 50% was on counseling and direct patient cares, ordering and management of chemotherapy  All of the patient's questions were  answered with apparent satisfaction. The patient knows to call the clinic with any problems, questions or concerns.   Sullivan Lone MD [place] AAHIVMS [facility] Hematology/Oncology Physician [facility]  (Office):       [phone] (Work cell):  [phone] (Fax):           [phone]  01/02/2020 10:02 AM  I, [name], am acting as a scribe for Dr. Sullivan Lone.   .I have reviewed the above documentation for accuracy and completeness, and I agree with the above. Brunetta Genera MD

## 2020-01-02 ENCOUNTER — Inpatient Hospital Stay: Payer: Medicare Other | Attending: Hematology

## 2020-01-02 ENCOUNTER — Inpatient Hospital Stay (HOSPITAL_BASED_OUTPATIENT_CLINIC_OR_DEPARTMENT_OTHER): Payer: Medicare Other | Admitting: Hematology

## 2020-01-02 ENCOUNTER — Other Ambulatory Visit: Payer: Self-pay

## 2020-01-02 ENCOUNTER — Inpatient Hospital Stay: Payer: Medicare Other

## 2020-01-02 ENCOUNTER — Other Ambulatory Visit: Payer: Self-pay | Admitting: *Deleted

## 2020-01-02 VITALS — BP 99/80 | HR 114 | Temp 97.3°F | Resp 20 | Ht 70.5 in | Wt 206.3 lb

## 2020-01-02 VITALS — BP 103/59 | HR 94 | Temp 97.9°F | Resp 18

## 2020-01-02 DIAGNOSIS — Z5111 Encounter for antineoplastic chemotherapy: Secondary | ICD-10-CM | POA: Insufficient documentation

## 2020-01-02 DIAGNOSIS — C8339 Diffuse large B-cell lymphoma, extranodal and solid organ sites: Secondary | ICD-10-CM

## 2020-01-02 DIAGNOSIS — Z7984 Long term (current) use of oral hypoglycemic drugs: Secondary | ICD-10-CM | POA: Insufficient documentation

## 2020-01-02 DIAGNOSIS — I700 Atherosclerosis of aorta: Secondary | ICD-10-CM | POA: Diagnosis not present

## 2020-01-02 DIAGNOSIS — Z79899 Other long term (current) drug therapy: Secondary | ICD-10-CM | POA: Insufficient documentation

## 2020-01-02 DIAGNOSIS — I2510 Atherosclerotic heart disease of native coronary artery without angina pectoris: Secondary | ICD-10-CM | POA: Diagnosis not present

## 2020-01-02 DIAGNOSIS — R05 Cough: Secondary | ICD-10-CM | POA: Insufficient documentation

## 2020-01-02 DIAGNOSIS — Z880 Allergy status to penicillin: Secondary | ICD-10-CM | POA: Insufficient documentation

## 2020-01-02 DIAGNOSIS — Z9079 Acquired absence of other genital organ(s): Secondary | ICD-10-CM | POA: Insufficient documentation

## 2020-01-02 DIAGNOSIS — I10 Essential (primary) hypertension: Secondary | ICD-10-CM | POA: Diagnosis not present

## 2020-01-02 DIAGNOSIS — M1990 Unspecified osteoarthritis, unspecified site: Secondary | ICD-10-CM | POA: Insufficient documentation

## 2020-01-02 DIAGNOSIS — Z5189 Encounter for other specified aftercare: Secondary | ICD-10-CM | POA: Insufficient documentation

## 2020-01-02 DIAGNOSIS — Z95828 Presence of other vascular implants and grafts: Secondary | ICD-10-CM

## 2020-01-02 DIAGNOSIS — E119 Type 2 diabetes mellitus without complications: Secondary | ICD-10-CM | POA: Diagnosis not present

## 2020-01-02 DIAGNOSIS — Z87442 Personal history of urinary calculi: Secondary | ICD-10-CM | POA: Insufficient documentation

## 2020-01-02 DIAGNOSIS — Z5112 Encounter for antineoplastic immunotherapy: Secondary | ICD-10-CM | POA: Insufficient documentation

## 2020-01-02 DIAGNOSIS — R519 Headache, unspecified: Secondary | ICD-10-CM | POA: Diagnosis not present

## 2020-01-02 DIAGNOSIS — N183 Chronic kidney disease, stage 3 unspecified: Secondary | ICD-10-CM | POA: Insufficient documentation

## 2020-01-02 DIAGNOSIS — C8335 Diffuse large B-cell lymphoma, lymph nodes of inguinal region and lower limb: Secondary | ICD-10-CM | POA: Insufficient documentation

## 2020-01-02 DIAGNOSIS — R55 Syncope and collapse: Secondary | ICD-10-CM | POA: Insufficient documentation

## 2020-01-02 DIAGNOSIS — Z885 Allergy status to narcotic agent status: Secondary | ICD-10-CM | POA: Diagnosis not present

## 2020-01-02 DIAGNOSIS — R509 Fever, unspecified: Secondary | ICD-10-CM | POA: Diagnosis not present

## 2020-01-02 DIAGNOSIS — M5126 Other intervertebral disc displacement, lumbar region: Secondary | ICD-10-CM | POA: Insufficient documentation

## 2020-01-02 DIAGNOSIS — Z888 Allergy status to other drugs, medicaments and biological substances status: Secondary | ICD-10-CM | POA: Insufficient documentation

## 2020-01-02 LAB — CMP (CANCER CENTER ONLY)
ALT: 17 U/L (ref 0–44)
AST: 13 U/L — ABNORMAL LOW (ref 15–41)
Albumin: 3.8 g/dL (ref 3.5–5.0)
Alkaline Phosphatase: 109 U/L (ref 38–126)
Anion gap: 8 (ref 5–15)
BUN: 28 mg/dL — ABNORMAL HIGH (ref 8–23)
CO2: 25 mmol/L (ref 22–32)
Calcium: 9.9 mg/dL (ref 8.9–10.3)
Chloride: 101 mmol/L (ref 98–111)
Creatinine: 1.6 mg/dL — ABNORMAL HIGH (ref 0.61–1.24)
GFR, Est AFR Am: 50 mL/min — ABNORMAL LOW (ref >60)
GFR, Estimated: 43 mL/min — ABNORMAL LOW (ref >60)
Glucose, Bld: 213 mg/dL — ABNORMAL HIGH (ref 70–99)
Potassium: 4.2 mmol/L (ref 3.5–5.1)
Sodium: 134 mmol/L — ABNORMAL LOW (ref 135–145)
Total Bilirubin: 0.3 mg/dL (ref 0.3–1.2)
Total Protein: 7.2 g/dL (ref 6.5–8.1)

## 2020-01-02 LAB — CBC WITH DIFFERENTIAL (CANCER CENTER ONLY)
Abs Immature Granulocytes: 0.21 10*3/uL — ABNORMAL HIGH (ref 0.00–0.07)
Basophils Absolute: 0.1 10*3/uL (ref 0.0–0.1)
Basophils Relative: 0 %
Eosinophils Absolute: 0.1 10*3/uL (ref 0.0–0.5)
Eosinophils Relative: 1 %
HCT: 30.8 % — ABNORMAL LOW (ref 39.0–52.0)
Hemoglobin: 10.2 g/dL — ABNORMAL LOW (ref 13.0–17.0)
Immature Granulocytes: 2 %
Lymphocytes Relative: 6 %
Lymphs Abs: 0.7 10*3/uL (ref 0.7–4.0)
MCH: 30.4 pg (ref 26.0–34.0)
MCHC: 33.1 g/dL (ref 30.0–36.0)
MCV: 91.9 fL (ref 80.0–100.0)
Monocytes Absolute: 0.6 10*3/uL (ref 0.1–1.0)
Monocytes Relative: 5 %
Neutro Abs: 10 10*3/uL — ABNORMAL HIGH (ref 1.7–7.7)
Neutrophils Relative %: 86 %
Platelet Count: 307 10*3/uL (ref 150–400)
RBC: 3.35 MIL/uL — ABNORMAL LOW (ref 4.22–5.81)
RDW: 16.9 % — ABNORMAL HIGH (ref 11.5–15.5)
WBC Count: 11.7 10*3/uL — ABNORMAL HIGH (ref 4.0–10.5)
nRBC: 0 % (ref 0.0–0.2)

## 2020-01-02 LAB — LACTATE DEHYDROGENASE: LDH: 172 U/L (ref 98–192)

## 2020-01-02 LAB — MAGNESIUM: Magnesium: 1.9 mg/dL (ref 1.7–2.4)

## 2020-01-02 MED ORDER — DOXORUBICIN HCL CHEMO IV INJECTION 2 MG/ML
50.0000 mg/m2 | Freq: Once | INTRAVENOUS | Status: AC
  Administered 2020-01-02: 112 mg via INTRAVENOUS
  Filled 2020-01-02: qty 56

## 2020-01-02 MED ORDER — SODIUM CHLORIDE 0.9 % IV SOLN
Freq: Once | INTRAVENOUS | Status: AC
  Filled 2020-01-02: qty 250

## 2020-01-02 MED ORDER — FAMOTIDINE IN NACL 20-0.9 MG/50ML-% IV SOLN
INTRAVENOUS | Status: AC
  Filled 2020-01-02: qty 50

## 2020-01-02 MED ORDER — SODIUM CHLORIDE 0.9 % IV SOLN
375.0000 mg/m2 | Freq: Once | INTRAVENOUS | Status: AC
  Administered 2020-01-02: 800 mg via INTRAVENOUS
  Filled 2020-01-02: qty 50

## 2020-01-02 MED ORDER — HEPARIN SOD (PORK) LOCK FLUSH 100 UNIT/ML IV SOLN
500.0000 [IU] | Freq: Once | INTRAVENOUS | Status: AC | PRN
  Administered 2020-01-02: 500 [IU]
  Filled 2020-01-02: qty 5

## 2020-01-02 MED ORDER — SODIUM CHLORIDE 0.9 % IV SOLN
150.0000 mg | Freq: Once | INTRAVENOUS | Status: AC
  Administered 2020-01-02: 150 mg via INTRAVENOUS
  Filled 2020-01-02: qty 150

## 2020-01-02 MED ORDER — ACETAMINOPHEN 325 MG PO TABS
650.0000 mg | ORAL_TABLET | Freq: Once | ORAL | Status: AC
  Administered 2020-01-02: 650 mg via ORAL

## 2020-01-02 MED ORDER — PALONOSETRON HCL INJECTION 0.25 MG/5ML
0.2500 mg | Freq: Once | INTRAVENOUS | Status: AC
  Administered 2020-01-02: 0.25 mg via INTRAVENOUS

## 2020-01-02 MED ORDER — DIPHENHYDRAMINE HCL 25 MG PO CAPS
50.0000 mg | ORAL_CAPSULE | Freq: Once | ORAL | Status: AC
  Administered 2020-01-02: 50 mg via ORAL

## 2020-01-02 MED ORDER — FAMOTIDINE IN NACL 20-0.9 MG/50ML-% IV SOLN
20.0000 mg | Freq: Once | INTRAVENOUS | Status: AC
  Administered 2020-01-02: 20 mg via INTRAVENOUS

## 2020-01-02 MED ORDER — ACETAMINOPHEN 325 MG PO TABS
ORAL_TABLET | ORAL | Status: AC
  Filled 2020-01-02: qty 2

## 2020-01-02 MED ORDER — SODIUM CHLORIDE 0.9 % IV SOLN
500.0000 mg/m2 | Freq: Once | INTRAVENOUS | Status: AC
  Administered 2020-01-02: 1120 mg via INTRAVENOUS
  Filled 2020-01-02: qty 56

## 2020-01-02 MED ORDER — DIPHENHYDRAMINE HCL 25 MG PO CAPS
ORAL_CAPSULE | ORAL | Status: AC
  Filled 2020-01-02: qty 2

## 2020-01-02 MED ORDER — VINCRISTINE SULFATE CHEMO INJECTION 1 MG/ML
2.0000 mg | Freq: Once | INTRAVENOUS | Status: AC
  Administered 2020-01-02: 2 mg via INTRAVENOUS
  Filled 2020-01-02: qty 2

## 2020-01-02 MED ORDER — PALONOSETRON HCL INJECTION 0.25 MG/5ML
INTRAVENOUS | Status: AC
  Filled 2020-01-02: qty 5

## 2020-01-02 MED ORDER — SODIUM CHLORIDE 0.9% FLUSH
10.0000 mL | INTRAVENOUS | Status: DC | PRN
  Administered 2020-01-02: 10 mL
  Filled 2020-01-02: qty 10

## 2020-01-02 MED ORDER — SODIUM CHLORIDE 0.9 % IV SOLN
10.0000 mg | Freq: Once | INTRAVENOUS | Status: AC
  Administered 2020-01-02: 10 mg via INTRAVENOUS
  Filled 2020-01-02: qty 10

## 2020-01-02 NOTE — Patient Instructions (Signed)
[facility] Discharge Instructions for Patients Receiving Chemotherapy  Today you received the following chemotherapy agents Doxorubicin (ADRIAMYCIN), Vincristine (ONCOVINE), Cyclophosphamide (CYTOXAN) & Rituximab-pvvr (RUXIENCE).  To help prevent nausea and vomiting after your treatment, we encourage you to take your nausea medication as prescribed.   If you develop nausea and vomiting that is not controlled by your nausea medication, call the clinic.   BELOW ARE SYMPTOMS THAT SHOULD BE REPORTED IMMEDIATELY:  *FEVER GREATER THAN 100.5 F  *CHILLS WITH OR WITHOUT FEVER  NAUSEA AND VOMITING THAT IS NOT CONTROLLED WITH YOUR NAUSEA MEDICATION  *UNUSUAL SHORTNESS OF BREATH  *UNUSUAL BRUISING OR BLEEDING  TENDERNESS IN MOUTH AND THROAT WITH OR WITHOUT PRESENCE OF ULCERS  *URINARY PROBLEMS  *BOWEL PROBLEMS  UNUSUAL RASH Items with * indicate a potential emergency and should be followed up as soon as possible.  Feel free to call the clinic should you have any questions or concerns. The clinic phone number is [phone].  Please show the [facility] at check-in to the Emergency Department and triage nurse.

## 2020-01-02 NOTE — Progress Notes (Signed)
Verbal order per Dr. Irene Limbo: Madaline Brilliant to receive D1C5 of R-CHOP today with Cr 1.6

## 2020-01-05 ENCOUNTER — Inpatient Hospital Stay: Payer: Medicare Other

## 2020-01-05 ENCOUNTER — Other Ambulatory Visit: Payer: Self-pay | Admitting: *Deleted

## 2020-01-05 ENCOUNTER — Other Ambulatory Visit: Payer: Self-pay

## 2020-01-05 VITALS — BP 118/73 | HR 99 | Resp 18

## 2020-01-05 DIAGNOSIS — C8339 Diffuse large B-cell lymphoma, extranodal and solid organ sites: Secondary | ICD-10-CM

## 2020-01-05 DIAGNOSIS — Z5112 Encounter for antineoplastic immunotherapy: Secondary | ICD-10-CM | POA: Diagnosis not present

## 2020-01-05 MED ORDER — PEGFILGRASTIM-CBQV 6 MG/0.6ML ~~LOC~~ SOSY
PREFILLED_SYRINGE | SUBCUTANEOUS | Status: AC
  Filled 2020-01-05: qty 0.6

## 2020-01-05 MED ORDER — LORATADINE 10 MG PO TABS: 10.0000 mg | ORAL_TABLET | ORAL | 0 refills | Status: DC

## 2020-01-05 MED ORDER — PEGFILGRASTIM-CBQV 6 MG/0.6ML ~~LOC~~ SOSY
6.0000 mg | PREFILLED_SYRINGE | Freq: Once | SUBCUTANEOUS | Status: AC
  Administered 2020-01-05: 6 mg via SUBCUTANEOUS

## 2020-01-05 NOTE — Telephone Encounter (Signed)
Received faxed refill request for Loratadine

## 2020-01-05 NOTE — Patient Instructions (Signed)

## 2020-01-05 NOTE — Progress Notes (Signed)
Received 2 barcodes that were unreadable for pt let front ppl know.

## 2020-01-07 ENCOUNTER — Other Ambulatory Visit: Payer: Self-pay | Admitting: Hematology

## 2020-01-07 DIAGNOSIS — C8339 Diffuse large B-cell lymphoma, extranodal and solid organ sites: Secondary | ICD-10-CM

## 2020-01-09 NOTE — Telephone Encounter (Signed)
Please review for refill.  

## 2020-01-12 ENCOUNTER — Encounter: Payer: Self-pay | Admitting: Hematology

## 2020-01-13 ENCOUNTER — Telehealth: Payer: Self-pay | Admitting: *Deleted

## 2020-01-13 NOTE — Telephone Encounter (Signed)
MyChart message: "Can you call me something in to help me with this sinus drainage etc I'm taking what you gave me but sinus drainage a lille bit worse this time". Called patient. He is taking claritin and benadryl and drinking lots of fluids. He stated the fatigue is worse this time - had chemo on 9/10. Dr. Irene Limbo informed. Dr.Kale's response: Fatigue from chemotherapy can be cumulative. Would recommend sterile saline sprays QID OTC and steam inhalation twice daily. If persistent symptoms, fever/colored nasal discharge/sinus pain or sore throat will need to be evaluated by PCP inperson or tele-visit and would recommend getting covid testing done. Contacted patient with Dr. Grier Mitts response. He states he does not think it will help, but he will try the saline and steam. He plans to contact PCP tomorrow.  States he wants Dr. Irene Limbo informed that he will be too sick to have lumbar puncture for chemo on Friday. Dr. Irene Limbo informed.

## 2020-01-15 ENCOUNTER — Telehealth: Payer: Self-pay

## 2020-01-15 NOTE — Telephone Encounter (Signed)
Received call from patient c/o fever of 101-102 since last Friday, his PCP ordered him some zpack and the fever broke but he is having severe sinus drainage, fatigued, diarrhea and aching all over. His next appt. here is on 10/01. Per Dr. Irene Limbo: okay to schedule him to see Earna Coder tomorrow at the [facility]. TCT patient informing him to come in at 0900 to be seen at the [facility] and that Dr. Irene Limbo would also like him to get tested for COVID. Patient verbalized understanding.

## 2020-01-16 ENCOUNTER — Encounter (HOSPITAL_COMMUNITY): Payer: Self-pay

## 2020-01-16 ENCOUNTER — Emergency Department (HOSPITAL_COMMUNITY): Payer: Medicare Other

## 2020-01-16 ENCOUNTER — Inpatient Hospital Stay (HOSPITAL_BASED_OUTPATIENT_CLINIC_OR_DEPARTMENT_OTHER): Payer: Medicare Other | Admitting: Medical

## 2020-01-16 ENCOUNTER — Other Ambulatory Visit: Payer: Self-pay

## 2020-01-16 ENCOUNTER — Ambulatory Visit (HOSPITAL_COMMUNITY)
Admission: RE | Admit: 2020-01-16 | Discharge: 2020-01-16 | Disposition: A | Discharge status: Home / Self Care | Payer: Medicare Other | Source: Ambulatory Visit | Attending: Medical | Admitting: Medical

## 2020-01-16 ENCOUNTER — Inpatient Hospital Stay (HOSPITAL_COMMUNITY)
Admission: EM | Admit: 2020-01-16 | Discharge: 2020-01-21 | DRG: 314 | Disposition: A | Discharge status: Home / Self Care | Payer: Medicare Other | Attending: Internal Medicine | Admitting: Internal Medicine

## 2020-01-16 VITALS — BP 111/63 | HR 122 | Temp 98.0°F | Resp 18 | Ht 70.5 in | Wt 205.4 lb

## 2020-01-16 DIAGNOSIS — R6521 Severe sepsis with septic shock: Secondary | ICD-10-CM | POA: Diagnosis not present

## 2020-01-16 DIAGNOSIS — Z885 Allergy status to narcotic agent status: Secondary | ICD-10-CM

## 2020-01-16 DIAGNOSIS — Z888 Allergy status to other drugs, medicaments and biological substances status: Secondary | ICD-10-CM | POA: Diagnosis not present

## 2020-01-16 DIAGNOSIS — Z880 Allergy status to penicillin: Secondary | ICD-10-CM | POA: Diagnosis not present

## 2020-01-16 DIAGNOSIS — T451X5A Adverse effect of antineoplastic and immunosuppressive drugs, initial encounter: Secondary | ICD-10-CM | POA: Diagnosis present

## 2020-01-16 DIAGNOSIS — A419 Sepsis, unspecified organism: Secondary | ICD-10-CM | POA: Diagnosis present

## 2020-01-16 DIAGNOSIS — Y848 Other medical procedures as the cause of abnormal reaction of the patient, or of later complication, without mention of misadventure at the time of the procedure: Secondary | ICD-10-CM | POA: Diagnosis present

## 2020-01-16 DIAGNOSIS — Z794 Long term (current) use of insulin: Secondary | ICD-10-CM

## 2020-01-16 DIAGNOSIS — J189 Pneumonia, unspecified organism: Secondary | ICD-10-CM | POA: Diagnosis present

## 2020-01-16 DIAGNOSIS — D709 Neutropenia, unspecified: Secondary | ICD-10-CM | POA: Diagnosis present

## 2020-01-16 DIAGNOSIS — C8330 Diffuse large B-cell lymphoma, unspecified site: Secondary | ICD-10-CM | POA: Diagnosis not present

## 2020-01-16 DIAGNOSIS — D6481 Anemia due to antineoplastic chemotherapy: Secondary | ICD-10-CM | POA: Diagnosis present

## 2020-01-16 DIAGNOSIS — R05 Cough: Secondary | ICD-10-CM

## 2020-01-16 DIAGNOSIS — A403 Sepsis due to Streptococcus pneumoniae: Secondary | ICD-10-CM | POA: Diagnosis not present

## 2020-01-16 DIAGNOSIS — N172 Acute kidney failure with medullary necrosis: Secondary | ICD-10-CM | POA: Diagnosis not present

## 2020-01-16 DIAGNOSIS — R0602 Shortness of breath: Secondary | ICD-10-CM

## 2020-01-16 DIAGNOSIS — Z95828 Presence of other vascular implants and grafts: Secondary | ICD-10-CM

## 2020-01-16 DIAGNOSIS — T80219A Unspecified infection due to central venous catheter, initial encounter: Secondary | ICD-10-CM

## 2020-01-16 DIAGNOSIS — R55 Syncope and collapse: Secondary | ICD-10-CM | POA: Diagnosis present

## 2020-01-16 DIAGNOSIS — Z87442 Personal history of urinary calculi: Secondary | ICD-10-CM | POA: Diagnosis not present

## 2020-01-16 DIAGNOSIS — Y95 Nosocomial condition: Secondary | ICD-10-CM | POA: Diagnosis present

## 2020-01-16 DIAGNOSIS — G7281 Critical illness myopathy: Secondary | ICD-10-CM | POA: Diagnosis not present

## 2020-01-16 DIAGNOSIS — R509 Fever, unspecified: Secondary | ICD-10-CM

## 2020-01-16 DIAGNOSIS — Z79899 Other long term (current) drug therapy: Secondary | ICD-10-CM

## 2020-01-16 DIAGNOSIS — R0981 Nasal congestion: Secondary | ICD-10-CM | POA: Diagnosis present

## 2020-01-16 DIAGNOSIS — R6520 Severe sepsis without septic shock: Secondary | ICD-10-CM | POA: Diagnosis not present

## 2020-01-16 DIAGNOSIS — E119 Type 2 diabetes mellitus without complications: Secondary | ICD-10-CM | POA: Diagnosis present

## 2020-01-16 DIAGNOSIS — R059 Cough, unspecified: Secondary | ICD-10-CM

## 2020-01-16 DIAGNOSIS — M5126 Other intervertebral disc displacement, lumbar region: Secondary | ICD-10-CM | POA: Diagnosis present

## 2020-01-16 DIAGNOSIS — C8339 Diffuse large B-cell lymphoma, extranodal and solid organ sites: Secondary | ICD-10-CM | POA: Diagnosis present

## 2020-01-16 DIAGNOSIS — M1990 Unspecified osteoarthritis, unspecified site: Secondary | ICD-10-CM | POA: Diagnosis present

## 2020-01-16 DIAGNOSIS — C6290 Malignant neoplasm of unspecified testis, unspecified whether descended or undescended: Secondary | ICD-10-CM | POA: Diagnosis not present

## 2020-01-16 DIAGNOSIS — R531 Weakness: Secondary | ICD-10-CM | POA: Diagnosis not present

## 2020-01-16 DIAGNOSIS — G501 Atypical facial pain: Secondary | ICD-10-CM | POA: Diagnosis not present

## 2020-01-16 DIAGNOSIS — Z20822 Contact with and (suspected) exposure to covid-19: Secondary | ICD-10-CM | POA: Diagnosis present

## 2020-01-16 DIAGNOSIS — D849 Immunodeficiency, unspecified: Secondary | ICD-10-CM | POA: Diagnosis present

## 2020-01-16 DIAGNOSIS — I10 Essential (primary) hypertension: Secondary | ICD-10-CM | POA: Diagnosis present

## 2020-01-16 DIAGNOSIS — E1052 Type 1 diabetes mellitus with diabetic peripheral angiopathy with gangrene: Secondary | ICD-10-CM | POA: Diagnosis not present

## 2020-01-16 DIAGNOSIS — A021 Salmonella sepsis: Secondary | ICD-10-CM | POA: Diagnosis not present

## 2020-01-16 DIAGNOSIS — G9341 Metabolic encephalopathy: Secondary | ICD-10-CM | POA: Diagnosis not present

## 2020-01-16 LAB — URINALYSIS, ROUTINE W REFLEX MICROSCOPIC
Bacteria, UA: NONE SEEN
Bilirubin Urine: NEGATIVE
Glucose, UA: >=500 mg/dL — AB
Hgb urine dipstick: NEGATIVE
Ketones, ur: NEGATIVE mg/dL
Leukocytes,Ua: NEGATIVE
Nitrite: NEGATIVE
Protein, ur: NEGATIVE mg/dL
Specific Gravity, Urine: 1.014 (ref 1.005–1.030)
pH: 5 (ref 5.0–8.0)

## 2020-01-16 LAB — RESPIRATORY PANEL BY PCR

## 2020-01-16 LAB — CBC WITH DIFFERENTIAL/PLATELET
Abs Immature Granulocytes: 0.74 10*3/uL — ABNORMAL HIGH (ref 0.00–0.07)
Basophils Absolute: 0.1 10*3/uL (ref 0.0–0.1)
Basophils Relative: 1 %
Eosinophils Absolute: 0.1 10*3/uL (ref 0.0–0.5)
Eosinophils Relative: 0 %
HCT: 23.7 % — ABNORMAL LOW (ref 39.0–52.0)
Hemoglobin: 8.1 g/dL — ABNORMAL LOW (ref 13.0–17.0)
Immature Granulocytes: 6 %
Lymphocytes Relative: 6 %
Lymphs Abs: 0.7 10*3/uL (ref 0.7–4.0)
MCH: 32.1 pg (ref 26.0–34.0)
MCHC: 34.2 g/dL (ref 30.0–36.0)
MCV: 94 fL (ref 80.0–100.0)
Monocytes Absolute: 1 10*3/uL (ref 0.1–1.0)
Monocytes Relative: 9 %
Neutro Abs: 9 10*3/uL — ABNORMAL HIGH (ref 1.7–7.7)
Neutrophils Relative %: 78 %
Platelets: 271 10*3/uL (ref 150–400)
RBC: 2.52 MIL/uL — ABNORMAL LOW (ref 4.22–5.81)
RDW: 16.2 % — ABNORMAL HIGH (ref 11.5–15.5)
WBC: 11.6 10*3/uL — ABNORMAL HIGH (ref 4.0–10.5)
nRBC: 0.3 % — ABNORMAL HIGH (ref 0.0–0.2)

## 2020-01-16 LAB — RESPIRATORY PANEL BY RT PCR (FLU A&B, COVID)
Influenza A by PCR: NEGATIVE
Influenza B by PCR: NEGATIVE
SARS Coronavirus 2 by RT PCR: NEGATIVE

## 2020-01-16 LAB — COMPREHENSIVE METABOLIC PANEL
ALT: 20 U/L (ref 0–44)
AST: 20 U/L (ref 15–41)
Albumin: 3.4 g/dL — ABNORMAL LOW (ref 3.5–5.0)
Alkaline Phosphatase: 88 U/L (ref 38–126)
Anion gap: 13 (ref 5–15)
BUN: 18 mg/dL (ref 8–23)
CO2: 24 mmol/L (ref 22–32)
Calcium: 9.4 mg/dL (ref 8.9–10.3)
Chloride: 98 mmol/L (ref 98–111)
Creatinine, Ser: 1.38 mg/dL — ABNORMAL HIGH (ref 0.61–1.24)
GFR calc Af Amer: 60 mL/min — ABNORMAL LOW (ref >60)
GFR calc non Af Amer: 51 mL/min — ABNORMAL LOW (ref >60)
Glucose, Bld: 258 mg/dL — ABNORMAL HIGH (ref 70–99)
Potassium: 3.5 mmol/L (ref 3.5–5.1)
Sodium: 135 mmol/L (ref 135–145)
Total Bilirubin: 0.1 mg/dL — ABNORMAL LOW (ref 0.3–1.2)
Total Protein: 6.7 g/dL (ref 6.5–8.1)

## 2020-01-16 LAB — APTT: aPTT: 41 seconds — ABNORMAL HIGH (ref 24–36)

## 2020-01-16 LAB — PROTIME-INR
INR: 1.1 (ref 0.8–1.2)
Prothrombin Time: 14.2 seconds (ref 11.4–15.2)

## 2020-01-16 LAB — LACTIC ACID, PLASMA
Lactic Acid, Venous: 1.5 mmol/L (ref 0.5–1.9)
Lactic Acid, Venous: 1.5 mmol/L (ref 0.5–1.9)

## 2020-01-16 LAB — GLUCOSE, CAPILLARY: Glucose-Capillary: 133 mg/dL — ABNORMAL HIGH (ref 70–99)

## 2020-01-16 MED ORDER — SODIUM CHLORIDE 0.9% FLUSH
3.0000 mL | Freq: Two times a day (BID) | INTRAVENOUS | Status: DC
  Administered 2020-01-16 – 2020-01-21 (×6): 3 mL via INTRAVENOUS

## 2020-01-16 MED ORDER — LACTATED RINGERS IV SOLN: INTRAVENOUS | Status: AC

## 2020-01-16 MED ORDER — SENNOSIDES-DOCUSATE SODIUM 8.6-50 MG PO TABS
2.0000 | ORAL_TABLET | Freq: Every evening | ORAL | Status: DC | PRN
  Administered 2020-01-17: 2 via ORAL
  Filled 2020-01-16: qty 2

## 2020-01-16 MED ORDER — ACETAMINOPHEN 325 MG PO TABS
650.0000 mg | ORAL_TABLET | Freq: Four times a day (QID) | ORAL | Status: DC | PRN
  Administered 2020-01-16 – 2020-01-19 (×7): 650 mg via ORAL
  Filled 2020-01-16 (×8): qty 2

## 2020-01-16 MED ORDER — SODIUM CHLORIDE 0.9 % IV SOLN
2.0000 g | Freq: Two times a day (BID) | INTRAVENOUS | Status: DC
  Administered 2020-01-16 – 2020-01-19 (×6): 2 g via INTRAVENOUS
  Filled 2020-01-16 (×6): qty 2

## 2020-01-16 MED ORDER — GUAIFENESIN-DM 100-10 MG/5ML PO SYRP
5.0000 mL | ORAL_SOLUTION | ORAL | Status: DC | PRN
  Administered 2020-01-16 – 2020-01-21 (×8): 5 mL via ORAL
  Filled 2020-01-16 (×9): qty 10

## 2020-01-16 MED ORDER — INSULIN ASPART 100 UNIT/ML ~~LOC~~ SOLN
0.0000 [IU] | Freq: Every day | SUBCUTANEOUS | Status: DC
  Filled 2020-01-16: qty 0.05

## 2020-01-16 MED ORDER — ONDANSETRON HCL 4 MG/2ML IJ SOLN
4.0000 mg | Freq: Four times a day (QID) | INTRAMUSCULAR | Status: DC | PRN
  Administered 2020-01-17 – 2020-01-18 (×2): 4 mg via INTRAVENOUS
  Filled 2020-01-16 (×3): qty 2

## 2020-01-16 MED ORDER — LACTATED RINGERS IV BOLUS (SEPSIS)
1000.0000 mL | Freq: Once | INTRAVENOUS | Status: AC
  Administered 2020-01-16: 1000 mL via INTRAVENOUS

## 2020-01-16 MED ORDER — METRONIDAZOLE IN NACL 5-0.79 MG/ML-% IV SOLN
500.0000 mg | Freq: Three times a day (TID) | INTRAVENOUS | Status: DC
  Administered 2020-01-16 – 2020-01-18 (×6): 500 mg via INTRAVENOUS
  Filled 2020-01-16 (×6): qty 100

## 2020-01-16 MED ORDER — ONDANSETRON HCL 4 MG PO TABS: 4.0000 mg | ORAL_TABLET | Freq: Four times a day (QID) | ORAL | Status: DC | PRN

## 2020-01-16 MED ORDER — VANCOMYCIN HCL 750 MG/150ML IV SOLN
750.0000 mg | Freq: Two times a day (BID) | INTRAVENOUS | Status: DC
  Administered 2020-01-17 – 2020-01-19 (×5): 750 mg via INTRAVENOUS
  Filled 2020-01-16 (×8): qty 150

## 2020-01-16 MED ORDER — METRONIDAZOLE IN NACL 5-0.79 MG/ML-% IV SOLN: 500.0000 mg | Freq: Three times a day (TID) | INTRAVENOUS | Status: DC

## 2020-01-16 MED ORDER — PANTOPRAZOLE SODIUM 40 MG PO TBEC
40.0000 mg | DELAYED_RELEASE_TABLET | Freq: Every day | ORAL | Status: DC
  Administered 2020-01-16 – 2020-01-21 (×6): 40 mg via ORAL
  Filled 2020-01-16 (×6): qty 1

## 2020-01-16 MED ORDER — IBUPROFEN 200 MG PO TABS
100.0000 mg | ORAL_TABLET | Freq: Three times a day (TID) | ORAL | Status: DC | PRN
  Administered 2020-01-16: 100 mg via ORAL
  Filled 2020-01-16: qty 1

## 2020-01-16 MED ORDER — LORAZEPAM 0.5 MG PO TABS
0.5000 mg | ORAL_TABLET | Freq: Four times a day (QID) | ORAL | Status: DC | PRN
  Administered 2020-01-17 – 2020-01-18 (×2): 0.5 mg via ORAL
  Filled 2020-01-16 (×2): qty 1

## 2020-01-16 MED ORDER — ACETAMINOPHEN 325 MG PO TABS
650.0000 mg | ORAL_TABLET | Freq: Once | ORAL | Status: AC
  Administered 2020-01-16: 650 mg via ORAL
  Filled 2020-01-16: qty 2

## 2020-01-16 MED ORDER — METRONIDAZOLE IN NACL 5-0.79 MG/ML-% IV SOLN
500.0000 mg | Freq: Once | INTRAVENOUS | Status: AC
  Administered 2020-01-16: 500 mg via INTRAVENOUS
  Filled 2020-01-16: qty 100

## 2020-01-16 MED ORDER — ACETAMINOPHEN 650 MG RE SUPP: 650.0000 mg | Freq: Four times a day (QID) | RECTAL | Status: DC | PRN

## 2020-01-16 MED ORDER — VANCOMYCIN HCL IN DEXTROSE 1-5 GM/200ML-% IV SOLN: 1000.0000 mg | Freq: Once | INTRAVENOUS | Status: DC

## 2020-01-16 MED ORDER — SODIUM CHLORIDE 0.9 % IV SOLN
2.0000 g | Freq: Once | INTRAVENOUS | Status: AC
  Administered 2020-01-16: 2 g via INTRAVENOUS
  Filled 2020-01-16: qty 2

## 2020-01-16 MED ORDER — INSULIN GLARGINE 100 UNIT/ML ~~LOC~~ SOLN
30.0000 [IU] | Freq: Every day | SUBCUTANEOUS | Status: DC
  Administered 2020-01-17 – 2020-01-20 (×3): 30 [IU] via SUBCUTANEOUS
  Filled 2020-01-16 (×4): qty 0.3

## 2020-01-16 MED ORDER — SODIUM CHLORIDE 0.9 % IV BOLUS
1000.0000 mL | Freq: Once | INTRAVENOUS | Status: AC
  Administered 2020-01-16: 1000 mL via INTRAVENOUS

## 2020-01-16 MED ORDER — INSULIN ASPART 100 UNIT/ML ~~LOC~~ SOLN
0.0000 [IU] | Freq: Three times a day (TID) | SUBCUTANEOUS | Status: DC
  Administered 2020-01-17 – 2020-01-18 (×3): 2 [IU] via SUBCUTANEOUS
  Administered 2020-01-18 – 2020-01-20 (×2): 3 [IU] via SUBCUTANEOUS
  Administered 2020-01-20: 2 [IU] via SUBCUTANEOUS
  Administered 2020-01-21: 3 [IU] via SUBCUTANEOUS
  Filled 2020-01-16: qty 0.15

## 2020-01-16 MED ORDER — HEPARIN SODIUM (PORCINE) 5000 UNIT/ML IJ SOLN
5000.0000 [IU] | Freq: Three times a day (TID) | INTRAMUSCULAR | Status: DC
  Administered 2020-01-16 – 2020-01-21 (×12): 5000 [IU] via SUBCUTANEOUS
  Filled 2020-01-16 (×12): qty 1

## 2020-01-16 MED ORDER — VANCOMYCIN HCL 1750 MG/350ML IV SOLN
1750.0000 mg | Freq: Once | INTRAVENOUS | Status: AC
  Administered 2020-01-16: 1750 mg via INTRAVENOUS
  Filled 2020-01-16: qty 350

## 2020-01-16 NOTE — Progress Notes (Signed)
[facility] Progress Note   Patrick Bautista [id number] Sep 05, 1950 69 y.o.  Patrick Bautista is managed by Dr. Sullivan Lone  Actively treated with chemotherapy/immunotherapy/hormonal therapy: yes  Current therapy: Ruxience, Cytoxan, Adriamycin, vincristine with Udenyca support  Last treated: 01/02/2020 (cycle 5, day 1)  Next scheduled appointment with provider: 01/23/2020  Assessment: Plan:    Cough - Plan: DG Chest 2 View  Fever, unspecified fever cause - Plan: DG Chest 2 View  Diffuse large B-cell lymphoma, unspecified body region ([place])   Cough, fever, and facial pressure refractory to antibiotics and Tylenol: The patient was referred for a chest x-ray which returned showing no acute issues of concern.  After discussion with the patient's oncologist the patient was taken to the emergency room for evaluation and management.  Diffuse large B-cell lymphoma: [name] is status post cycle 5, day 1 of Ruxience, Cytoxan, Adriamycin, vincristine with Udenyca support with his last cycle beginning on 01/02/2020.  He is scheduled to return for cycle 6 of therapy on 01/23/2020.  Please see After Visit Summary for patient specific instructions.  Future Appointments  Date Time Provider [place]  01/23/2020  8:45 AM [facility] None  01/23/2020  9:00 AM [facility] [place] FLUSH [facility] None  01/23/2020  9:40 AM Brunetta Genera, MD [facility] None  01/23/2020 10:45 AM [facility] INFUSION [facility] None    Orders Placed This Encounter  Procedures  . DG Chest 2 View       Subjective:   Patient ID:  Patrick Bautista is a 69 y.o. (DOB 11-30-1950) male.  Chief Complaint:  Chief Complaint  Patient presents with  . Facial Pain    HPI Patrick Bautista  is a 69 y.o. male with a diagnosis of a diffuse large B-cell lymphoma.  He is managed by Dr. Sullivan Lone and is status post cycle 5, day 1 of Ruxience, Cytoxan, Adriamycin, vincristine with Udenyca support with  his last cycle beginning on 01/02/2020.  He reports that he has had significant fatigue with his last cycle of chemotherapy.  He thought he was developing a sinus infection earlier this week.  He has a history of recurrent sinus infections and called earlier this week stating that he was having a sore throat, minimally productive cough, frontal sinus pressure and pain fevers.  He contacted his primary care provider on Wednesday and was prescribed a Z-Pak.  Despite this he has continued to have fevers from 10 2-1 03.  He has been taking Tylenol regularly but reports that his fevers recur as the Tylenol is wearing off.  He reports only having contact with 2 individuals both of whom have tested negative this week for COVID-19.  He also reports being in a bathroom passing out while he was at the urinal.  He hit the bridge of his nose.  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 Large left-sided testicular mass consistent with known testicular cancer. 2. No findings for metastatic disease involving the chest, abdomen, or pelvis. 3. A few small scattered retroperitoneal lymph nodes but no mass or overt adenopathy. " 09/30/2019 PET/CT ([id number]) revealed "1. Small right adrenal nodule exhibits intense tracer uptake with SUV max of 13.03. Compared with the study from 08/26/2019 this appears to represent a new finding. This nodule has mean Hounsfield units of 31.5 compared with mean  Hounsfield units of 1.44 on 08/26/2019. Cannot rule out right adrenal gland involvement. 2. No FDG avid (Deauville criteria 3 or greater) lymph nodes or mass identified within the remainder of the neck, chest, abdomen or pelvis. 3. Aortic atherosclerosis, in addition to RCA and LAD coronary artery disease. Please note that  although the presence of coronary artery calcium documents the presence of coronary artery disease, the severity of this disease and any potential stenosis cannot be assessed on this non-gated CT examination. Assessment for potential risk factor modification, dietary therapy or pharmacologic therapy may be warranted, if clinically indicated. Aortic Atherosclerosis (ICD10-I70.0)." Begun on chemotherapy with Ruxience, Cytoxan, Adriamycin, vincristine with Udenyca support.  Medications: I have reviewed the patient's current medications.  Allergies:  Allergies  Allergen Reactions  . Hydrocodone Nausea And Vomiting  . Percocet [Oxycodone-Acetaminophen] Nausea And Vomiting  . Codeine Itching  . Penicillins Itching    Has patient had a PCN reaction causing immediate rash, facial/tongue/throat swelling, SOB or lightheadedness with hypotension: No Has patient had a PCN reaction causing severe rash involving mucus membranes or skin necrosis: No Has patient had a PCN reaction that required hospitalization No Has patient had a PCN reaction occurring within the last 10 years: No If all of the above answers are "NO", then may proceed with Cephalosporin use.   . Tape Itching and Rash     Paper Tape causes blisters; please use Coban wrap if possible    Past Medical History:  Diagnosis Date  . Arthritis   . 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 Diverticulitis 15 yrs ago  . History of kidney stones    right ureteral stone and one in 2017  . Hypertension   . 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 colonscopy  15 yrs ago  . CYSTOSCOPY/URETEROSCOPY/HOLMIUM LASER/STENT PLACEMENT Right 02/16/2017   Procedure: CYSTOSCOPY/RETROGRADE/URETEROSCOPY/HOLMIUM LASER/STENT PLACEMENT;  Surgeon: Ardis Hughs, MD;  Location: [facility];  Service: Urology;  Laterality: Right;  NEEDS 60 MIN FOR PROCEDURE  . IR IMAGING GUIDED PORT INSERTION   09/19/2019  . MOUTH SURGERY  20 yrs ago  . ORCHIECTOMY Left 09/03/2019   Procedure: LEFT ORCHIECTOMY;  Surgeon: Ardis Hughs, MD;  Location: [facility];  Service: Urology;  Laterality: Left;    No family history on file.  Social History   Socioeconomic History  . Marital status: Legally Separated    Spouse name: Not on file  . Number of children: Not on file  . Years of education: Not on file  . Highest education level: Not on file  Occupational History  . Not on file  Tobacco Use  . Smoking status: Never Smoker  . Smokeless tobacco: Never Used  Vaping Use  . Vaping Use: Never used  Substance and Sexual Activity  . Alcohol use: No  . Drug use: No  . Sexual activity: Not on file  Other Topics Concern  . Not on file  Social History Narrative   Pt states "Potential reaction after covid vaccination" States "Experienced left fingers numb and left inner thigh numb, left testicular sweating, all within 5 hours of vaccination".   Social Determinants of Health   Financial Resource Strain:   . Difficulty of Paying Living Expenses: Not on file  Food Insecurity:   . Worried About Charity fundraiser in the Last Year: Not on file  . Ran Out of  Food in the Last Year: Not on file  Transportation Needs:   . Lack of Transportation (Medical): Not on file  . Lack of Transportation (Non-Medical): Not on file  Physical Activity:   . Days of Exercise per Week: Not on file  . Minutes of Exercise per Session: Not on file  Stress:   . Feeling of Stress : Not on file  Social Connections:   . Frequency of Communication with Friends and Family: Not on file  . Frequency of Social Gatherings with Friends and Family: Not on file  . Attends Religious Services: Not on file  . Active Member of Clubs or Organizations: Not on file  . Attends Archivist Meetings: Not on file  . Marital Status: Not on file  Intimate Partner Violence:   . Fear of Current or Ex-Partner: Not on file  .  Emotionally Abused: Not on file  . Physically Abused: Not on file  . Sexually Abused: Not on file    Past Medical History, Surgical history, Social history, and Family history were reviewed and updated as appropriate.   Please see review of systems for further details on the patient's review from today.   Review of Systems:  Review of Systems  Constitutional: Positive for chills, diaphoresis, fatigue and fever.  HENT: Positive for sinus pressure, sinus pain and sore throat. Negative for congestion, postnasal drip and trouble swallowing.   Respiratory: Positive for cough and shortness of breath. Negative for wheezing.   Cardiovascular: Negative for chest pain and palpitations.  Gastrointestinal: Negative for constipation, diarrhea, nausea and vomiting.  Genitourinary: Negative for difficulty urinating and dysuria.  Neurological: Negative for headaches.    Objective:   Physical Exam:  BP 111/63 (BP Location: Left Arm, Patient Position: Sitting)   Pulse (!) 122 Comment: Liza RN was aware.  Temp 98 F (36.7 C) (Tympanic)   Resp 18   Ht 5' 10.5" (1.791 m)   Wt 205 lb 6.4 oz (93.2 kg)   SpO2 98%   BMI 29.06 kg/m  ECOG: 1  Physical Exam Constitutional:      General: He is not in acute distress.    Appearance: Normal appearance. He is not ill-appearing, toxic-appearing or diaphoretic.  HENT:     Head: Normocephalic.     Comments: A contusion was noted on the bridge of the nose. Neurological:     Mental Status: He is alert.     Coordination: Coordination normal.     Gait: Gait normal.  Psychiatric:        Mood and Affect: Mood normal.        Behavior: Behavior normal.        Thought Content: Thought content normal.        Judgment: Judgment normal.    The remainder of the patient's physical exam was deferred as this was to occur after he had his chest x-ray done but it was decided to take him directly from radiology to the emergency room.  Lab Review:     Component  Value Date/Time   NA 135 01/16/2020 1108   K 3.5 01/16/2020 1108   CL 98 01/16/2020 1108   CO2 24 01/16/2020 1108   GLUCOSE 258 (H) 01/16/2020 1108   BUN 18 01/16/2020 1108   CREATININE 1.38 (H) 01/16/2020 1108   CREATININE 1.60 (H) 01/02/2020 0845   CALCIUM 9.4 01/16/2020 1108   PROT 6.7 01/16/2020 1108   ALBUMIN 3.4 (L) 01/16/2020 1108   AST 20 01/16/2020 1108  AST 13 (L) 01/02/2020 0845   ALT 20 01/16/2020 1108   ALT 17 01/02/2020 0845   ALKPHOS 88 01/16/2020 1108   BILITOT 0.1 (L) 01/16/2020 1108   BILITOT 0.3 01/02/2020 0845   GFRNONAA 51 (L) 01/16/2020 1108   GFRNONAA 43 (L) 01/02/2020 0845   GFRAA 60 (L) 01/16/2020 1108   GFRAA 50 (L) 01/02/2020 0845       Component Value Date/Time   WBC 11.6 (H) 01/16/2020 1108   RBC 2.52 (L) 01/16/2020 1108   HGB 8.1 (L) 01/16/2020 1108   HGB 10.2 (L) 01/02/2020 0845   HCT 23.7 (L) 01/16/2020 1108   PLT 271 01/16/2020 1108   PLT 307 01/02/2020 0845   MCV 94.0 01/16/2020 1108   MCH 32.1 01/16/2020 1108   MCHC 34.2 01/16/2020 1108   RDW 16.2 (H) 01/16/2020 1108   LYMPHSABS 0.7 01/16/2020 1108   MONOABS 1.0 01/16/2020 1108   EOSABS 0.1 01/16/2020 1108   BASOSABS 0.1 01/16/2020 1108   -------------------------------  Imaging from last 24 hours (if applicable):  Radiology interpretation: DG Chest 2 View  Result Date: 01/16/2020 CLINICAL DATA:  Lymphoma with cough and recurrent fever EXAM: CHEST - 2 VIEW COMPARISON:  Chest CT 08/26/2019 FINDINGS: Normal heart size and mediastinal contours. Porta catheter on the right with tip at the SVC. There is no edema, consolidation, effusion, or pneumothorax. Spondylosis. IMPRESSION: No evidence of acute disease. Electronically Signed   By: Monte Fantasia M.D.   On: 01/16/2020 10:17   DG Fluoro Guide Spinal/SI Jt Inj Left  Result Date: 12/26/2019 Etheleen Mayhew, MD     12/26/2019  2:56 PM Lumbar puncture performed at L3-L4 for intrathecal chemotherapy infusion.  12 mg methotrexate  administered intrathecally.  No complications.  Please see dictation in PACS for additional details.   CT Maxillofacial Wo Contrast  Result Date: 01/16/2020 CLINICAL DATA:  Syncope with nasal trauma. Sinus pressure, cough. History of B-cell lymphoma EXAM: CT MAXILLOFACIAL WITHOUT CONTRAST TECHNIQUE: Multidetector CT imaging of the maxillofacial structures was performed. Multiplanar CT image reconstructions were also generated. COMPARISON:  None. FINDINGS: Osseous: No acute maxillofacial bone fracture. Bony orbital walls intact. Mandible intact. Temporomandibular joints aligned without dislocation. Orbits: Negative. No traumatic or inflammatory finding. Sinuses: Paranasal sinuses are well aerated and clear. No mucosal thickening or air-fluid level. Pneumatization of the bilateral middle nasal turbinates. Bilateral mastoid air cells are clear. Soft tissues: No soft tissue swelling. No masses identified. No lacrimal gland enlargement. Limited intracranial: No significant or unexpected finding. IMPRESSION: 1. Negative for acute maxillofacial bone fracture. 2. Paranasal sinuses are well aerated and clear. Electronically Signed   By: Davina Poke D.O.   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 EXAM: DIAGNOSTIC LUMBAR PUNCTURE UNDER FLUOROSCOPIC GUIDANCE INTRATHECAL CHEMOTHERAPY INJECTION FLUOROSCOPY TIME:  Fluoroscopy Time:  1 minutes and 24 seconds Radiation Exposure Index (if provided by the fluoroscopic device): 13.6 mGy PROCEDURE: Informed consent was obtained from the patient prior to the procedure, including potential complications of headache, allergy, and pain. With the patient prone, the lower back was prepped with Betadine. 1% Lidocaine was used for local anesthesia. Lumbar puncture was performed at the L3-L4 level using a 20 gauge needle with return of  clear CSF. No CSF was obtained for laboratory studies. 12 mg of methotrexate was injected intrathecally. The patient tolerated the procedure well and there  were no apparent complications. IMPRESSION: 1. Successful uncomplicated fluoroscopic guided lumbar puncture for intrathecal chemotherapy injection, as above. Electronically Signed   By: Vinnie Langton M.D.   On: 12/26/2019 14:43        This case was discussed with Dr. Irene Limbo. He expresses agreement with my management of this patient.

## 2020-01-16 NOTE — ED Notes (Signed)
Patient transported to CT 

## 2020-01-16 NOTE — Progress Notes (Signed)
Pharmacy Antibiotic Note  Patrick Bautista is a 69 y.o. male admitted on 01/16/2020 with sepsis.  Pharmacy has been consulted for vancomycin and cefepime dosing.  Vancomycin 1750 mg IV x 1 administered in ED today at 1239 Cefepime 2 g IV x 1 administered in ED today at 1234  Plan: Vancomycin 750 mg IV every 12 hours for goal vancomycin trough 15-20 Cefepime 2 g IV every 12 hours Monitor clinical picture, renal function, vancomcin trough if indicated F/U C&S, abx deescalation / LOT   Height: 5\' 10"  (177.8 cm) Weight: 93.2 kg (205 lb 6.4 oz) IBW/kg (Calculated) : 73  Temp (24hrs), Avg:99.2 F (37.3 C), Min:98 F (36.7 C), Max:100.9 F (38.3 C)  Recent Labs  Lab 01/16/20 1108  WBC 11.6*  CREATININE 1.38*  LATICACIDVEN 1.5    Estimated Creatinine Clearance: 57.1 mL/min (A) (by C-G formula based on SCr of 1.38 mg/dL (H)).    Allergies  Allergen Reactions  . Hydrocodone Nausea And Vomiting  . Percocet [Oxycodone-Acetaminophen] Nausea And Vomiting  . Codeine Itching  . Penicillins Itching    Has patient had a PCN reaction causing immediate rash, facial/tongue/throat swelling, SOB or lightheadedness with hypotension: No Has patient had a PCN reaction causing severe rash involving mucus membranes or skin necrosis: No Has patient had a PCN reaction that required hospitalization No Has patient had a PCN reaction occurring within the last 10 years: No If all of the above answers are "NO", then may proceed with Cephalosporin use.   . Tape Itching and Rash     Paper Tape causes blisters; please use Coban wrap if possible      Microbiology results: 9/24 BCx: sent 9/24 UCx: sent  Thank you for allowing pharmacy to be a part of this patient's care.  Efraim Kaufmann, PharmD, BCPS 01/16/2020 7:17 PM

## 2020-01-16 NOTE — H&P (Signed)
.  History and Physical    Patrick Bautista GHW:[id number] DOB: Sep 14, 1950 DOA: 01/16/2020  PCP: [name], L.[name], MD  Patient coming from: Home  Chief Complaint: Fevers  HPI: Patrick Bautista is a 69 y.o. male with medical history significant of B-cell lymphoma. Presents with increasing fatigue and fever. Reports symptoms for last week. Initially started with sinus drainage. He spoke with his physician and they recommended saline flushes. This didn't help. He started having fevers up to 103 degrees. He talked with his physician again and he was given a z-pack. He completed all but the last day of the z-pack. He went to this oncologist and was recommended ED follow up due to fever and low blood pressures. He denies any sick contacts. He denies any other alleviating or aggravating factors.   ED Course: Workup revealed fevers, elevated RR, and elevated HR. CT sinus obtained, as well as CXR. Both were unremarkable. COVID screen negative. Flu screen negative. EDP spoke with oncology. [facility] called for admission.    Review of Systems:  Remainder of 10 point review of systems is otherwise negative for all not mentioned in HPI.   Past Medical History:  Diagnosis Date  . Arthritis   . Diabetes mellitus without complication ([place])    type 2  . Diverticulitis 15 yrs ago  . History of kidney stones    right ureteral stone and one in 2017  . Hypertension   . 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 colonscopy  15 yrs ago  . CYSTOSCOPY/URETEROSCOPY/HOLMIUM LASER/STENT PLACEMENT Right 02/16/2017   Procedure: CYSTOSCOPY/RETROGRADE/URETEROSCOPY/HOLMIUM LASER/STENT PLACEMENT;  Surgeon: Ardis Hughs, MD;  Location: [facility];  Service: Urology;  Laterality: Right;  NEEDS 60 MIN FOR PROCEDURE  . IR IMAGING GUIDED PORT INSERTION  09/19/2019  . MOUTH SURGERY  20 yrs ago  . ORCHIECTOMY Left 09/03/2019   Procedure: LEFT ORCHIECTOMY;  Surgeon: Ardis Hughs, MD;   Location: [facility];  Service: Urology;  Laterality: Left;     reports that he has never smoked. He has never used smokeless tobacco. He reports that he does not drink alcohol and does not use drugs.  Allergies  Allergen Reactions  . Hydrocodone Nausea And Vomiting  . Percocet [Oxycodone-Acetaminophen] Nausea And Vomiting  . Codeine Itching  . Penicillins Itching    Has patient had a PCN reaction causing immediate rash, facial/tongue/throat swelling, SOB or lightheadedness with hypotension: No Has patient had a PCN reaction causing severe rash involving mucus membranes or skin necrosis: No Has patient had a PCN reaction that required hospitalization No Has patient had a PCN reaction occurring within the last 10 years: No If all of the above answers are "NO", then may proceed with Cephalosporin use.   . Tape Itching and Rash     Paper Tape causes blisters; please use Coban wrap if possible    History reviewed. No pertinent family history.  Prior to Admission medications   Medication Sig Start Date End Date Taking? Authorizing Provider  acetaminophen (TYLENOL) 500 MG tablet Take 500-1,000 mg by mouth every 6 (six) hours as needed for moderate pain.    Yes [provider]  amLODipine (NORVASC) 2.5 MG tablet Take 2.5 mg by mouth daily. 2.5 mg + 5 mg =7.5 mg total daily dose   Yes [provider]  amLODipine (NORVASC) 5 MG tablet Take 5 mg by mouth daily. 2.5 mg + 5 mg =7.5  mg total daily dose 10/29/15  Yes [provider]  ergocalciferol (VITAMIN D2) 1.25 MG (50000 UT) capsule Take 1 capsule (50,000 Units total) by mouth 2 (two) times a week. 10/09/19  Yes Brunetta Genera, MD  fluocinonide cream (LIDEX) 3.97 % Apply 1 application topically 2 (two) times daily as needed (to access port).    Yes [provider]  HUMALOG KWIKPEN 100 UNIT/ML KwikPen Inject 1-5 Units into the skin See admin instructions. Sliding scale : up to 4 times a day. 09/23/19  Yes  [provider]  loratadine (CLARITIN) 10 MG tablet Take 1 tablet (10 mg total) by mouth See admin instructions. Patient states he takes for 7 days: from 3rd day post chemo until 10th day post chemo Patient taking differently: Take 10 mg by mouth daily.  01/05/20  Yes Brunetta Genera, MD  LORazepam (ATIVAN) 0.5 MG tablet TAKE 1 TABLET BY MOUTH EVERY 6 HOURS AS NEEDED (NAUSEA OR VOMITING). Patient taking differently: Take 0.5 mg by mouth every 6 (six) hours as needed for anxiety.  01/09/20  Yes Brunetta Genera, MD  olmesartan-hydrochlorothiazide (BENICAR HCT) 40-25 MG tablet Take 1 tablet by mouth daily. 07/31/19  Yes [provider]  pantoprazole (PROTONIX) 40 MG tablet TAKE 1 TABLET BY MOUTH DAILY BEFORE BREAKFAST Patient taking differently: Take 40 mg by mouth daily.  12/25/19  Yes Brunetta Genera, MD  senna-docusate (SENNA S) 8.6-50 MG tablet Take 2 tablets by mouth at bedtime as needed for mild constipation or moderate constipation. 10/22/19  Yes Kale, Cloria [place], MD  TRESIBA FLEXTOUCH 100 UNIT/ML FlexTouch Pen Inject 40 Units into the skin daily.  09/23/19  Yes [provider]  B-D UF III MINI PEN NEEDLES 31G X 5 MM MISC Inject into the skin 4 (four) times daily. 11/04/19   [provider]  citalopram (CELEXA) 10 MG tablet TAKE 1 TABLET BY MOUTH EVERY DAY Patient not taking: Reported on 01/16/2020 10/09/19   Brunetta Genera, MD  lidocaine-prilocaine (EMLA) cream Apply to affected area once Patient not taking: Reported on 01/16/2020 10/06/19   Brunetta Genera, MD  ondansetron (ZOFRAN) 8 MG tablet Take 1 tablet (8 mg total) by mouth 2 (two) times daily as needed for refractory nausea / vomiting. Start on day 3 after cyclophosphamide chemotherapy. Patient not taking: Reported on 01/16/2020 10/06/19   Brunetta Genera, MD  [facility] VERIO test strip 2 (two) times daily. 10/26/19   [provider]  predniSONE (DELTASONE) 20 MG tablet Take 3  tablets (60 mg total) by mouth daily with breakfast. Take with food on days 1-5 of chemotherapy. Patient not taking: Reported on 01/16/2020 10/06/19   Brunetta Genera, MD  prochlorperazine (COMPAZINE) 10 MG tablet Take 1 tablet (10 mg total) by mouth every 6 (six) hours as needed (Nausea or vomiting). Patient not taking: Reported on 01/16/2020 10/06/19   Brunetta Genera, MD  traMADol (ULTRAM) 50 MG tablet Take 1-2 tablets (50-100 mg total) by mouth every 6 (six) hours as needed for moderate pain. Patient not taking: Reported on 01/16/2020 09/03/19   Ardis Hughs, MD    Physical Exam: Vitals:   01/16/20 1630 01/16/20 1715 01/16/20 1726 01/16/20 1730  BP: 123/72 108/62  (!) 104/55  Pulse: (!) 113 (!) 114  (!) 113  Resp: (!) 23 (!) 23  (!) 32  Temp:   (!) 100.9 F (38.3 C)   TempSrc:   Oral   SpO2: 99% 94%  91%  Weight:  Height:        General: 69 y.o. male resting in bed in NAD Eyes: PERRL, normal sclera ENMT: Nares patent w/o discharge, orophaynx clear, dentition normal, ears w/o discharge/lesions/ulcers Neck: Supple, trachea midline Cardiovascular: RRR, +S1, S2, no m/g/r, equal pulses throughout Respiratory: CTABL, no w/r/r, normal WOB GI: BS+, NDNT, no masses noted, no organomegaly noted MSK: No e/c/c Skin: No rashes, bruises, ulcerations noted Neuro: A&O x 3, no focal deficits Psyc: Appropriate interaction and affect, calm/cooperative  Labs on Admission: I have personally reviewed following labs and imaging studies  CBC: Recent Labs  Lab 01/16/20 1108  WBC 11.6*  NEUTROABS 9.0*  HGB 8.1*  HCT 23.7*  MCV 94.0  PLT 818   Basic Metabolic Panel: Recent Labs  Lab 01/16/20 1108  NA 135  K 3.5  CL 98  CO2 24  GLUCOSE 258*  BUN 18  CREATININE 1.38*  CALCIUM 9.4   GFR: Estimated Creatinine Clearance: 57.1 mL/min (A) (by C-G formula based on SCr of 1.38 mg/dL (H)). Liver Function Tests: Recent Labs  Lab 01/16/20 1108  AST 20  ALT 20  ALKPHOS  88  BILITOT 0.1*  PROT 6.7  ALBUMIN 3.4*   No results for input(s): LIPASE, AMYLASE in the last 168 hours. No results for input(s): AMMONIA in the last 168 hours. Coagulation Profile: Recent Labs  Lab 01/16/20 1108  INR 1.1   Cardiac Enzymes: No results for input(s): CKTOTAL, CKMB, CKMBINDEX, TROPONINI in the last 168 hours. BNP (last 3 results) No results for input(s): PROBNP in the last 8760 hours. HbA1C: No results for input(s): HGBA1C in the last 72 hours. CBG: No results for input(s): GLUCAP in the last 168 hours. Lipid Profile: No results for input(s): CHOL, HDL, LDLCALC, TRIG, CHOLHDL, LDLDIRECT in the last 72 hours. Thyroid Function Tests: No results for input(s): TSH, T4TOTAL, FREET4, T3FREE, THYROIDAB in the last 72 hours. Anemia Panel: No results for input(s): VITAMINB12, FOLATE, FERRITIN, TIBC, IRON, RETICCTPCT in the last 72 hours. Urine analysis:    Component Value Date/Time   COLORURINE YELLOW 01/16/2020 1121   APPEARANCEUR CLEAR 01/16/2020 1121   LABSPEC 1.014 01/16/2020 1121   PHURINE 5.0 01/16/2020 1121   GLUCOSEU >=500 (A) 01/16/2020 1121   HGBUR NEGATIVE 01/16/2020 1121   BILIRUBINUR NEGATIVE 01/16/2020 1121   KETONESUR NEGATIVE 01/16/2020 1121   PROTEINUR NEGATIVE 01/16/2020 1121   NITRITE NEGATIVE 01/16/2020 1121   LEUKOCYTESUR NEGATIVE 01/16/2020 1121    Radiological Exams on Admission: DG Chest 2 View  Result Date: 01/16/2020 CLINICAL DATA:  Lymphoma with cough and recurrent fever EXAM: CHEST - 2 VIEW COMPARISON:  Chest CT 08/26/2019 FINDINGS: Normal heart size and mediastinal contours. Porta catheter on the right with tip at the SVC. There is no edema, consolidation, effusion, or pneumothorax. Spondylosis. IMPRESSION: No evidence of acute disease. Electronically Signed   By: Monte Fantasia M.D.   On: 01/16/2020 10:17   CT Maxillofacial Wo Contrast  Result Date: 01/16/2020 CLINICAL DATA:  Syncope with nasal trauma. Sinus pressure, cough.  History of B-cell lymphoma EXAM: CT MAXILLOFACIAL WITHOUT CONTRAST TECHNIQUE: Multidetector CT imaging of the maxillofacial structures was performed. Multiplanar CT image reconstructions were also generated. COMPARISON:  None. FINDINGS: Osseous: No acute maxillofacial bone fracture. Bony orbital walls intact. Mandible intact. Temporomandibular joints aligned without dislocation. Orbits: Negative. No traumatic or inflammatory finding. Sinuses: Paranasal sinuses are well aerated and clear. No mucosal thickening or air-fluid level. Pneumatization of the bilateral middle nasal turbinates. Bilateral mastoid air cells are clear. Soft  tissues: No soft tissue swelling. No masses identified. No lacrimal gland enlargement. Limited intracranial: No significant or unexpected finding. IMPRESSION: 1. Negative for acute maxillofacial bone fracture. 2. Paranasal sinuses are well aerated and clear. Electronically Signed   By: Davina Poke D.O.   On: 01/16/2020 12:43    EKG: Independently reviewed. NSR  Assessment/Plan Sepsis FUO     - admit to inpatient telemetry     - UA, CXR, CT sinuses are clear     - Bld Cx, UCx have been sent     - COVID/Flu negative, respiratory panel is pending     - no obvious sores     - no diarrhea     - currently on cefepime, flagyl, vanc and still having fevers; continue for now     - last chemo (B-cell lymphoma) two [place] ago.     - EDP has talked to Onco, they will also see     - fluids     - PRN APAP/ibuprofen  B-cell lymphoma     - last chemo two [place] ago per patient report     - Onco informed of admission, they will follow (Dr. Irene Limbo primary oncologist).  DM2     - check A1c     - SSI, lantus (takes tresiba at home), DM-diet  Normocytic anemia     - no evidence of bleed     - check iron studies  HTN     - pressures are soft right now; hold home meds for now  DVT prophylaxis: heparin Code Status: DNI  Family Communication: None at bedside  Consults called:  EDP spoke with Onco  Admission status: Inpatient d/t severity of illness and ongoing need for diagnostics not appropriate for outpatient.  Status is: Inpatient  Remains inpatient appropriate because:Inpatient level of care appropriate due to severity of illness   Dispo: The patient is from: Home              Anticipated d/c is to: Home              Anticipated d/c date is: 3 days              Patient currently is not medically stable to d/c.  Time spent coordinating admission: 75 minutes  [facility]  If 7PM-7AM, please contact night-coverage [email or web address]  01/16/2020, 6:14 PM

## 2020-01-16 NOTE — Progress Notes (Signed)
A consult was received from an ED physician for Vancomycin and Cefepime per pharmacy dosing.  The patient's profile has been reviewed for ht/wt/allergies/indication/available labs.   A one time order has been placed for Cefepime 2g, Vancomycin 1750mg .  Further antibiotics/pharmacy consults should be ordered by admitting physician if indicated.                       Thank you,  Gretta Arab PharmD, BCPS Clinical Pharmacist [facility] main pharmacy [phone] 01/16/2020 11:25 AM

## 2020-01-16 NOTE — ED Triage Notes (Addendum)
Patient brought over from [facility] by [place], [place] with c/o recurrent fevers 102-103, weakness, and pale.  Hx of B-cell lymphoma.  Patient was prescribed a zpak on Wednesday for a sinus infection.  Patient passed out once this week while using the bathroom and hit the bridge of his nose.  Patient last took tylenol this morning.  Per Lucianne Lei, [place] patient has been tachy (120s), productive cough, and frontal sinus pressure/pain.  Patient has 2 known contacts that were negative for covid, patient himself has not been tested.  Has been taking APAP Q4hrs for fevers.  Patient's last cancer treatment was 2 [place] ago, has had 5 treatments of Rituxan.

## 2020-01-16 NOTE — ED Notes (Signed)
Tried to call report and staff stated the patients room has not been approved yet.

## 2020-01-16 NOTE — Progress Notes (Signed)
Pt transported to [facility] via w/c with belongings by PA Lucianne Lei who called report by phone to ED Charge RN Manuela Schwartz.

## 2020-01-16 NOTE — Progress Notes (Signed)
   01/16/20 2055  Assess: MEWS Score  Temp (!) 101 F (38.3 C)  BP (!) 99/56  Pulse Rate (!) 102  Resp (!) 24  SpO2 96 %  O2 Device Room Air  Assess: MEWS Score  MEWS Temp 1  MEWS Systolic 1  MEWS Pulse 1  MEWS RR 1  MEWS LOC 0  MEWS Score 4  MEWS Score Color Red  Assess: if the MEWS score is Yellow or Red  Were vital signs taken at a resting state? Yes  Focused Assessment No change from prior assessment  Early Detection of Sepsis Score *See Row Information* High  MEWS guidelines implemented *See Row Information* Yes  Treat  MEWS Interventions Escalated (See documentation below)  Take Vital Signs  Increase Vital Sign Frequency  Red: Q 1hr X 4 then Q 4hr X 4, if remains red, continue Q 4hrs  Escalate  MEWS: Escalate Red: discuss with charge nurse/RN and provider, consider discussing with RRT  Notify: Charge Nurse/RN  Name of Charge Nurse/RN Notified Rich Fuchs   Date Charge Nurse/RN Notified 01/16/20  Time Charge Nurse/RN Notified 2055  Notify: Provider  Provider Name/Title Jeannette Corpus NP   Date Provider Notified 01/16/20  Time Provider Notified 2120  Notification Type Page  Notification Reason Change in status  Response See new orders  Date of Provider Response 01/16/20  Time of Provider Response 2141

## 2020-01-16 NOTE — ED Notes (Signed)
Patient back from CT.

## 2020-01-16 NOTE — Patient Instructions (Signed)

## 2020-01-16 NOTE — ED Notes (Signed)
Attempted to call report, no answer from the floor.

## 2020-01-16 NOTE — ED Provider Notes (Addendum)
[facility] DEPT Provider Note   CSN: [id number] Arrival date & time: 01/16/20  1025     History Chief Complaint  Patient presents with  . Fever  . Cough  . Facial Pain  . Weakness    Patrick Bautista is a 69 y.o. male.  HPI   Pt states he is getting chemo for lymphoma.  This was diagnosed after he received a Covid vaccination when he shortly thereafter started having testicular swelling.  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  Over the last several days he has been feeling increased fatigue.  He also started having sinus drainage.  He took otc meds and then started a zpack.  He has been having fevers to 102 103.  He has been coughing.  He has some soreness in this chest.  He does have persistent sinus congestion pt has been vaccinated for covid.  Patient was seen in the cancer center today.  They were concerned with his fever and hypotension.  He was sent to the ED for further treatment  Past Medical History:  Diagnosis Date  . Arthritis   . Diabetes mellitus without complication ([place])    type 2  . Diverticulitis 15 yrs ago  . History of kidney stones    right ureteral stone and one in 2017  . Hypertension   . 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 Port-A-Cath in place 11/19/2019  . 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 Kidney stones   . Acute pyelonephritis 11/05/2015  . Acute diverticulitis 11/05/2015  . AKI (acute kidney injury) ([place]) 11/04/2015    Past Surgical History:  Procedure Laterality Date  . colonscopy  15 yrs ago  . CYSTOSCOPY/URETEROSCOPY/HOLMIUM LASER/STENT PLACEMENT Right 02/16/2017   Procedure: CYSTOSCOPY/RETROGRADE/URETEROSCOPY/HOLMIUM LASER/STENT PLACEMENT;  Surgeon: Ardis Hughs, MD;  Location: [facility];  Service: Urology;  Laterality: Right;  NEEDS 60 MIN FOR PROCEDURE  . IR IMAGING GUIDED  PORT INSERTION  09/19/2019  . MOUTH SURGERY  20 yrs ago  . ORCHIECTOMY Left 09/03/2019   Procedure: LEFT ORCHIECTOMY;  Surgeon: Ardis Hughs, MD;  Location: [facility];  Service: Urology;  Laterality: Left;       History reviewed. No pertinent family history.  Social History   Tobacco Use  . Smoking status: Never Smoker  . Smokeless tobacco: Never Used  Vaping Use  . Vaping Use: Never used  Substance Use Topics  . Alcohol use: No  . Drug use: No    Home Medications Prior to Admission medications   Medication Sig Start Date End Date Taking? Authorizing Provider  acetaminophen (TYLENOL) 500 MG tablet Take 500-1,000 mg by mouth every 6 (six) hours as needed (pain.).     [provider]  ALPRAZolam Duanne Moron) 0.25 MG tablet Take 0.25 mg by mouth at bedtime as needed for sleep.    [provider]  amLODipine (NORVASC) 2.5 MG tablet Take 2.5 mg by mouth daily. 2.5 mg + 5 mg =7.5 mg total daily dose    [provider]  amLODipine (NORVASC) 5 MG tablet Take 5 mg by mouth daily. 2.5 mg + 5 mg =7.5 mg total daily dose 10/29/15   [provider]  B-D UF III MINI PEN NEEDLES 31G X 5 MM MISC Inject into the skin 4 (four) times daily. 11/04/19   [provider]  citalopram (CELEXA) 10 MG tablet TAKE 1 TABLET BY MOUTH EVERY DAY 10/09/19   Brunetta Genera, MD  ergocalciferol (VITAMIN D2) 1.25 MG (50000 UT) capsule Take 1 capsule (50,000 Units total) by mouth 2 (two) times a week. 10/09/19   Brunetta Genera, MD  fluocinonide cream (LIDEX) 1.61 % Apply 1 application topically 2 (two) times daily as needed (skin irritation.).    [provider]  glimepiride (AMARYL) 1 MG tablet Take 1 mg by mouth daily. 01/24/17   [provider]  Cleda Clarks 100 UNIT/ML KwikPen  09/23/19   [provider]  lidocaine-prilocaine (EMLA) cream Apply to affected area once 10/06/19   Brunetta Genera, MD  loratadine (CLARITIN) 10 MG tablet  Take 1 tablet (10 mg total) by mouth See admin instructions. Patient states he takes for 7 days: from 3rd day post chemo until 10th day post chemo 01/05/20   Brunetta Genera, MD  LORazepam (ATIVAN) 0.5 MG tablet TAKE 1 TABLET BY MOUTH EVERY 6 HOURS AS NEEDED (NAUSEA OR VOMITING). 01/09/20   Brunetta Genera, MD  olmesartan-hydrochlorothiazide (BENICAR HCT) 40-25 MG tablet Take 1 tablet by mouth daily. 07/31/19   [provider]  ondansetron (ZOFRAN) 8 MG tablet Take 1 tablet (8 mg total) by mouth 2 (two) times daily as needed for refractory nausea / vomiting. Start on day 3 after cyclophosphamide chemotherapy. 10/06/19   Brunetta Genera, MD  [facility] VERIO test strip 2 (two) times daily. 10/26/19   [provider]  pantoprazole (PROTONIX) 40 MG tablet TAKE 1 TABLET BY MOUTH DAILY BEFORE BREAKFAST 12/25/19   Brunetta Genera, MD  predniSONE (DELTASONE) 20 MG tablet Take 3 tablets (60 mg total) by mouth daily with breakfast. Take with food on days 1-5 of chemotherapy. 10/06/19   Brunetta Genera, MD  prochlorperazine (COMPAZINE) 10 MG tablet Take 1 tablet (10 mg total) by mouth every 6 (six) hours as needed (Nausea or vomiting). 10/06/19   Brunetta Genera, MD  senna-docusate (SENNA S) 8.6-50 MG tablet Take 2 tablets by mouth at bedtime as needed for mild constipation or moderate constipation. 10/22/19   Brunetta Genera, MD  traMADol (ULTRAM) 50 MG tablet Take 1-2 tablets (50-100 mg total) by mouth every 6 (six) hours as needed for moderate pain. 09/03/19   Ardis Hughs, MD  TRESIBA FLEXTOUCH 100 UNIT/ML FlexTouch Pen  09/23/19   [provider]    Allergies    Hydrocodone, Percocet [oxycodone-acetaminophen], Codeine, Penicillins, and Tape  Review of Systems   Review of Systems  All other systems reviewed and are negative.   Physical Exam Updated Vital Signs BP 96/79   Pulse 99   Temp 98.6 F (37 C) (Oral)   Resp (!) 27   Ht 1.778 m (5\' 10" )    Wt 93.2 kg   SpO2 99%   BMI 29.47 kg/m   Physical Exam Vitals and nursing note reviewed.  Constitutional:      Appearance: He is well-developed. He is not toxic-appearing or diaphoretic.  HENT:     Head: Normocephalic and atraumatic.     Right Ear: External ear normal.     Left Ear: External ear normal.     Nose: No rhinorrhea.     Comments: No erythema or swelling of the face noted Eyes:     General: No scleral icterus.       Right eye: No discharge.        Left eye:  No discharge.     Conjunctiva/sclera: Conjunctivae normal.  Neck:     Trachea: No tracheal deviation.  Cardiovascular:     Rate and Rhythm: Normal rate and regular rhythm.  Pulmonary:     Effort: Pulmonary effort is normal. No respiratory distress.     Breath sounds: Normal breath sounds. No stridor. No wheezing or rales.  Abdominal:     General: Bowel sounds are normal. There is no distension.     Palpations: Abdomen is soft.     Tenderness: There is no abdominal tenderness. There is no guarding or rebound.  Musculoskeletal:        General: No tenderness.     Cervical back: Neck supple.  Skin:    General: Skin is warm and dry.     Findings: No rash.  Neurological:     Mental Status: He is alert.     Cranial Nerves: No cranial nerve deficit (no facial droop, extraocular movements intact, no slurred speech).     Sensory: No sensory deficit.     Motor: No abnormal muscle tone or seizure activity.     Coordination: Coordination normal.     ED Results / Procedures / Treatments   Labs (all labs ordered are listed, but only abnormal results are displayed) Labs Reviewed  COMPREHENSIVE METABOLIC PANEL - Abnormal; Notable for the following components:      Result Value   Glucose, Bld 258 (*)    Creatinine, Ser 1.38 (*)    Albumin 3.4 (*)    Total Bilirubin 0.1 (*)    GFR calc non Af Amer 51 (*)    GFR calc Af Amer 60 (*)    All other components within normal limits  CBC WITH DIFFERENTIAL/PLATELET -  Abnormal; Notable for the following components:   WBC 11.6 (*)    RBC 2.52 (*)    Hemoglobin 8.1 (*)    HCT 23.7 (*)    RDW 16.2 (*)    nRBC 0.3 (*)    Neutro Abs 9.0 (*)    Abs Immature Granulocytes 0.74 (*)    All other components within normal limits  APTT - Abnormal; Notable for the following components:   aPTT 41 (*)    All other components within normal limits  URINALYSIS, ROUTINE W REFLEX MICROSCOPIC - Abnormal; Notable for the following components:   Glucose, UA >=500 (*)    All other components within normal limits  RESPIRATORY PANEL BY RT PCR (FLU A&B, COVID)  CULTURE, BLOOD (ROUTINE X 2)  CULTURE, BLOOD (ROUTINE X 2)  URINE CULTURE  LACTIC ACID, PLASMA  PROTIME-INR    EKG EKG Interpretation  Date/Time:  Friday January 16 2020 11:18:32 EDT Ventricular Rate:  96 PR Interval:    QRS Duration: 96 QT Interval:  358 QTC Calculation: 453 R Axis:   6 Text Interpretation: Sinus rhythm Borderline prolonged PR interval Abnormal R-wave progression, early transition No significant change since last tracing Confirmed by Dorie Rank [phone]) on 01/16/2020 11:22:37 AM   Radiology DG Chest 2 View  Result Date: 01/16/2020 CLINICAL DATA:  Lymphoma with cough and recurrent fever EXAM: CHEST - 2 VIEW COMPARISON:  Chest CT 08/26/2019 FINDINGS: Normal heart size and mediastinal contours. Porta catheter on the right with tip at the SVC. There is no edema, consolidation, effusion, or pneumothorax. Spondylosis. IMPRESSION: No evidence of acute disease. Electronically Signed   By: Monte Fantasia M.D.   On: 01/16/2020 10:17   CT Maxillofacial Wo Contrast  Result Date: 01/16/2020 CLINICAL  DATA:  Syncope with nasal trauma. Sinus pressure, cough. History of B-cell lymphoma EXAM: CT MAXILLOFACIAL WITHOUT CONTRAST TECHNIQUE: Multidetector CT imaging of the maxillofacial structures was performed. Multiplanar CT image reconstructions were also generated. COMPARISON:  None. FINDINGS: Osseous: No  acute maxillofacial bone fracture. Bony orbital walls intact. Mandible intact. Temporomandibular joints aligned without dislocation. Orbits: Negative. No traumatic or inflammatory finding. Sinuses: Paranasal sinuses are well aerated and clear. No mucosal thickening or air-fluid level. Pneumatization of the bilateral middle nasal turbinates. Bilateral mastoid air cells are clear. Soft tissues: No soft tissue swelling. No masses identified. No lacrimal gland enlargement. Limited intracranial: No significant or unexpected finding. IMPRESSION: 1. Negative for acute maxillofacial bone fracture. 2. Paranasal sinuses are well aerated and clear. Electronically Signed   By: Davina Poke D.O.   On: 01/16/2020 12:43    Procedures Procedures (including critical care time)  Medications Ordered in ED Medications  lactated ringers infusion (has no administration in time range)  lactated ringers bolus 1,000 mL (1,000 mLs Intravenous New Bag/Given 01/16/20 1420)    And  lactated ringers bolus 1,000 mL (0 mLs Intravenous Stopped 01/16/20 1420)    And  lactated ringers bolus 1,000 mL (1,000 mLs Intravenous New Bag/Given 01/16/20 1317)  ceFEPIme (MAXIPIME) 2 g in sodium chloride 0.9 % 100 mL IVPB ( Intravenous Stopped 01/16/20 1238)  metroNIDAZOLE (FLAGYL) IVPB 500 mg ( Intravenous Stopped 01/16/20 1314)  vancomycin (VANCOREADY) IVPB 1750 mg/350 mL (1,750 mg Intravenous New Bag/Given 01/16/20 1239)    ED Course  I have reviewed the triage vital signs and the nursing notes.  Pertinent labs & imaging results that were available during my care of the patient were reviewed by me and considered in my medical decision making (see chart for details).  Clinical Course as of Jan 16 1515  Fri Jan 16, 2020  1122 Blood pressure is hypotensive at the bedside.  Will start sepsis protocol   [JK]  1122 CT of the sinuses also ordered considering the patient's focal symptoms   [JK]  1434 Patient's hemoglobin is decreased at  8.1.  Urinalysis without signs of infection.  Covid is negative.   [JK]  4401 Metabolic panel notable for hyperglycemia   [JK]  1436 Sinuses are clear on CT scan   [JK]  1436 Chest x-ray without acute findings   [JK]  1441 Discussed with Dr Irene Limbo, remains hypotensive.  Will arrange for admission   [JK]  1443 Discussed with Dr Marylyn Ishihara    [JK]    Clinical Course User Index [JK] Dorie Rank, MD   MDM Rules/Calculators/A&P                          Patient presented to ED for evaluation of fever in the setting of chemotherapy.  Patient has had cough and a fever up to 10 2-1 03.  In the ED the patient has remained with borderline blood pressures.  Laboratory tests however otherwise reassuring with no lactic acidosis.  Chest x-ray does not show pneumonia.  Sinus CT does not show any obvious infection.  Covid and flu tests are negative.  Dr.Kale recommends adding on an RSV.  I will consult the medical service for admission. Final Clinical Impression(s) / ED Diagnoses Final diagnoses:  Sepsis, due to unspecified organism, unspecified whether acute organ dysfunction present [facility])      Dorie Rank, MD 01/16/20 1459    Dorie Rank, MD 01/16/20 [phone]

## 2020-01-17 ENCOUNTER — Inpatient Hospital Stay (HOSPITAL_COMMUNITY): Payer: Medicare Other

## 2020-01-17 DIAGNOSIS — A419 Sepsis, unspecified organism: Secondary | ICD-10-CM | POA: Diagnosis not present

## 2020-01-17 LAB — URINE CULTURE: Culture: NO GROWTH

## 2020-01-17 LAB — LACTIC ACID, PLASMA: Lactic Acid, Venous: 1.5 mmol/L (ref 0.5–1.9)

## 2020-01-17 LAB — HEMOGLOBIN A1C
Hgb A1c MFr Bld: 9.6 % — ABNORMAL HIGH (ref 4.8–5.6)
Mean Plasma Glucose: 228.82 mg/dL

## 2020-01-17 LAB — PROTIME-INR
INR: 1.2 (ref 0.8–1.2)
Prothrombin Time: 14.4 seconds (ref 11.4–15.2)

## 2020-01-17 LAB — CORTISOL-AM, BLOOD: Cortisol - AM: 11.2 ug/dL (ref 6.7–22.6)

## 2020-01-17 LAB — PROCALCITONIN: Procalcitonin: 0.43 ng/mL

## 2020-01-17 MED ORDER — IBUPROFEN 200 MG PO TABS
200.0000 mg | ORAL_TABLET | Freq: Four times a day (QID) | ORAL | Status: DC | PRN
  Administered 2020-01-17 (×2): 200 mg via ORAL
  Filled 2020-01-17 (×4): qty 1

## 2020-01-17 MED ORDER — CHLORHEXIDINE GLUCONATE CLOTH 2 % EX PADS
6.0000 | MEDICATED_PAD | Freq: Every day | CUTANEOUS | Status: DC
  Administered 2020-01-17 – 2020-01-19 (×3): 6 via TOPICAL

## 2020-01-17 MED ORDER — BENZONATATE 100 MG PO CAPS
100.0000 mg | ORAL_CAPSULE | Freq: Three times a day (TID) | ORAL | Status: DC
  Administered 2020-01-17 – 2020-01-21 (×11): 100 mg via ORAL
  Filled 2020-01-17 (×11): qty 1

## 2020-01-17 MED ORDER — KETOROLAC TROMETHAMINE 15 MG/ML IJ SOLN
15.0000 mg | Freq: Once | INTRAMUSCULAR | Status: AC
  Administered 2020-01-17: 15 mg via INTRAVENOUS
  Filled 2020-01-17: qty 1

## 2020-01-17 MED ORDER — SODIUM CHLORIDE 0.9% FLUSH: 10.0000 mL | Freq: Two times a day (BID) | INTRAVENOUS | Status: DC

## 2020-01-17 MED ORDER — SODIUM CHLORIDE 0.9% FLUSH: 10.0000 mL | INTRAVENOUS | Status: DC | PRN

## 2020-01-17 NOTE — Progress Notes (Signed)
PROGRESS NOTE    Patrick Bautista  MHD:[id number] DOB: [birth year] DOA: 01/16/2020 PCP: [name], L.[name], MD  Brief Narrative:Patrick Bautista is a 69 y.o. male with medical history significant of B-cell lymphoma. Presents with increasing fatigue and fever. Reports symptoms for last week. Initially started with sinus drainage. He spoke with his physician and they recommended saline flushes. This didn't help. He started having fevers up to 103 degrees. He talked with his physician again and he was given a z-pack. He completed all but the last day of the z-pack. He went to this oncologist and was recommended ED follow up due to fever and low blood pressures. He denies any sick contacts. He denies any other alleviating or aggravating factors.   ED Course: Workup revealed fevers, elevated RR, and elevated HR. CT sinus obtained, as well as CXR. Both were unremarkable. COVID screen negative. Flu screen negative. EDP spoke with oncology. [facility] called for admission.     Assessment & Plan:   Active Problems:   Sepsis ([place])    #1 sepsis of unclear etiology present on admission-patient presented with fever T-max 103 tachycardic heart rate 105 tachypneic respiratory rate 24 blood pressure 99/55 hypotensive. Panculture done in the ER started on Vanco cefepime and Flagyl. Patient is also immunosuppressed from recent chemo with history of lymphoma non-Hodgkin's lymphoma He does not have leukocytosis or lactic acidosis procalcitonin is 0.48 however this may be masked due to immunocompromise state. Cultures from port ordered, infectious disease consulted. Tylenol and Motrin for fever. Chest x-ray clear UA negative Covid negative flu negative respiratory panel negative CT of the sinuses is clear Patient has no diarrhea ?viral fever ?fungal infection ?tumor fever  #2 history of non-Hodgkin's lymphoma status post chemo 2 Massi ago  #3 type 2 diabetes on SSI CBG (last 3)  Recent Labs    01/16/20 2244  GLUCAP  133*   #4 hypertension patient with a history of hypertension but now he is hypotensive likely from sepsis hold home meds.      Estimated body mass index is 29.47 kg/m as calculated from the following:   Height as of this encounter: 5\' 10"  (1.778 m).   Weight as of this encounter: 93.2 kg.  DVT prophylaxis: Heparin Code Status: Partial code Family Communication: None at bedside  disposition Plan:  Status is: Inpatient  Dispo: The patient is from: Home              Anticipated d/c is to: Home              Anticipated d/c date is: > 3 days              Patient currently is not medically stable to d/c.   Consultants:    id  Procedures: none Antimicrobials: Anti-infectives (From admission, onward)   Start     Dose/Rate Route Frequency Ordered Stop   01/17/20 0000  vancomycin (VANCOREADY) IVPB 750 mg/150 mL        750 mg 150 mL/hr over 60 Minutes Intravenous Every 12 hours 01/16/20 1917     01/16/20 2215  metroNIDAZOLE (FLAGYL) IVPB 500 mg  Status:  Discontinued        500 mg 100 mL/hr over 60 Minutes Intravenous Every 8 hours 01/16/20 2127 01/16/20 2131   01/16/20 2200  ceFEPIme (MAXIPIME) 2 g in sodium chloride 0.9 % 100 mL IVPB        2 g 200 mL/hr over 30 Minutes Intravenous Every 12 hours 01/16/20 1910  01/16/20 2200  metroNIDAZOLE (FLAGYL) IVPB 500 mg        500 mg 100 mL/hr over 60 Minutes Intravenous Every 8 hours 01/16/20 2041     01/16/20 1145  vancomycin (VANCOREADY) IVPB 1750 mg/350 mL        1,750 mg 175 mL/hr over 120 Minutes Intravenous  Once 01/16/20 1125 01/16/20 1508   01/16/20 1115  ceFEPIme (MAXIPIME) 2 g in sodium chloride 0.9 % 100 mL IVPB        2 g 200 mL/hr over 30 Minutes Intravenous  Once 01/16/20 1109 01/16/20 1238   01/16/20 1115  metroNIDAZOLE (FLAGYL) IVPB 500 mg        500 mg 100 mL/hr over 60 Minutes Intravenous  Once 01/16/20 1109 01/16/20 1314   01/16/20 1115  vancomycin (VANCOCIN) IVPB 1000 mg/200 mL premix  Status:  Discontinued         1,000 mg 200 mL/hr over 60 Minutes Intravenous  Once 01/16/20 1109 01/16/20 1125       Subjective: Patient resting in bed very concerned anxious about what is going on.  He is afraid he is dying. Continues to have high-grade fever Had some periods of dry cough when I was in the room with him no nausea vomiting diarrhea abdominal pain chest pain  Objective: Vitals:   01/17/20 0527 01/17/20 0724 01/17/20 0944 01/17/20 1246  BP: (!) 92/51 (!) 105/56 (!) 99/46 (!) 99/55  Pulse: 79 96 (!) 109 81  Resp: 20 (!) 21 (!) 21 20  Temp: 99.3 F (37.4 C) (!) 101.4 F (38.6 C) (!) 103 F (39.4 C) 98.5 F (36.9 C)  TempSrc: Oral Oral Oral Oral  SpO2: 95% 92% 94% 95%  Weight:      Height:        Intake/Output Summary (Last 24 hours) at 01/17/2020 1322 Last data filed at 01/17/2020 1247 Gross per 24 hour  Intake 2901.61 ml  Output 2350 ml  Net 551.61 ml   Filed Weights   01/16/20 1044 01/16/20 1351  Weight: 93.2 kg 93.2 kg    Examination: Right upper chest port in place with no erythema surrounding it no tenderness General exam: Appears calm and comfortable  Respiratory system: Clear to auscultation. Respiratory effort normal. Cardiovascular system: S1 & S2 heard, RRR. No JVD, murmurs, rubs, gallops or clicks. No pedal edema. Gastrointestinal system: Abdomen is nondistended, soft and nontender. No organomegaly or masses felt. Normal bowel sounds heard. Central nervous system: Alert and oriented. No focal neurological deficits. Extremities: Symmetric 5 x 5 power. Skin: No rashes, lesions or ulcers Psychiatry: Judgement and insight appear normal. Mood & affect appropriate.     Data Reviewed: I have personally reviewed following labs and imaging studies  CBC: Recent Labs  Lab 01/16/20 1108  WBC 11.6*  NEUTROABS 9.0*  HGB 8.1*  HCT 23.7*  MCV 94.0  PLT 098   Basic Metabolic Panel: Recent Labs  Lab 01/16/20 1108  NA 135  K 3.5  CL 98  CO2 24  GLUCOSE 258*  BUN  18  CREATININE 1.38*  CALCIUM 9.4   GFR: Estimated Creatinine Clearance: 57.1 mL/min (A) (by C-G formula based on SCr of 1.38 mg/dL (H)). Liver Function Tests: Recent Labs  Lab 01/16/20 1108  AST 20  ALT 20  ALKPHOS 88  BILITOT 0.1*  PROT 6.7  ALBUMIN 3.4*   No results for input(s): LIPASE, AMYLASE in the last 168 hours. No results for input(s): AMMONIA in the last 168 hours. Coagulation Profile: Recent  Labs  Lab 01/16/20 1108 01/17/20 0042  INR 1.1 1.2   Cardiac Enzymes: No results for input(s): CKTOTAL, CKMB, CKMBINDEX, TROPONINI in the last 168 hours. BNP (last 3 results) No results for input(s): PROBNP in the last 8760 hours. HbA1C: Recent Labs    01/16/20 2141  HGBA1C 9.6*   CBG: Recent Labs  Lab 01/16/20 2244  GLUCAP 133*   Lipid Profile: No results for input(s): CHOL, HDL, LDLCALC, TRIG, CHOLHDL, LDLDIRECT in the last 72 hours. Thyroid Function Tests: No results for input(s): TSH, T4TOTAL, FREET4, T3FREE, THYROIDAB in the last 72 hours. Anemia Panel: No results for input(s): VITAMINB12, FOLATE, FERRITIN, TIBC, IRON, RETICCTPCT in the last 72 hours. Sepsis Labs: Recent Labs  Lab 01/16/20 1108 01/16/20 2146 01/17/20 0042  PROCALCITON  --   --  0.43  LATICACIDVEN 1.5 1.5 1.5    Recent Results (from the past 240 hour(s))  Respiratory Panel by RT PCR (Flu A&B, Covid) - Nasopharyngeal Swab     Status: None   Collection Time: 01/16/20 11:09 AM   Specimen: Nasopharyngeal Swab  Result Value Ref Range Status   SARS Coronavirus 2 by RT PCR NEGATIVE NEGATIVE Final    Comment: (NOTE) SARS-CoV-2 target nucleic acids are NOT DETECTED.  The SARS-CoV-2 RNA is generally detectable in upper respiratoy specimens during the acute phase of infection. The lowest concentration of SARS-CoV-2 viral copies this assay can detect is 131 copies/mL. A negative result does not preclude SARS-Cov-2 infection and should not be used as the sole basis for treatment or other  patient management decisions. A negative result may occur with  improper specimen collection/handling, submission of specimen other than nasopharyngeal swab, presence of viral mutation(s) within the areas targeted by this assay, and inadequate number of viral copies (<131 copies/mL). A negative result must be combined with clinical observations, patient history, and epidemiological information. The expected result is Negative.  Fact Sheet for Patients:  PinkCheek.be  Fact Sheet for Healthcare Providers:  GravelBags.it  This test is no t yet approved or cleared by the [place] FDA and  has been authorized for detection and/or diagnosis of SARS-CoV-2 by FDA under an Emergency Use Authorization (EUA). This EUA will remain  in effect (meaning this test can be used) for the duration of the COVID-19 declaration under Section 564(b)(1) of the Act, 21 U.S.C. section 360bbb-3(b)(1), unless the authorization is terminated or revoked sooner.     Influenza A by PCR NEGATIVE NEGATIVE Final   Influenza B by PCR NEGATIVE NEGATIVE Final    Comment: (NOTE) The Xpert Xpress SARS-CoV-2/FLU/RSV assay is intended as an aid in  the diagnosis of influenza from Nasopharyngeal swab specimens and  should not be used as a sole basis for treatment. Nasal washings and  aspirates are unacceptable for Xpert Xpress SARS-CoV-2/FLU/RSV  testing.  Fact Sheet for Patients: PinkCheek.be  Fact Sheet for Healthcare Providers: GravelBags.it  This test is not yet approved or cleared by the [place] FDA and  has been authorized for detection and/or diagnosis of SARS-CoV-2 by  FDA under an Emergency Use Authorization (EUA). This EUA will remain  in effect (meaning this test can be used) for the duration of the  Covid-19 declaration under Section 564(b)(1) of the Act, 21  U.S.C. section  360bbb-3(b)(1), unless the authorization is  terminated or revoked. Performed at [facility], [place] [address], [place], [place] [address]   Blood Culture (routine x 2)     Status: None (Preliminary result)   Collection  Time: 01/16/20 11:13 AM   Specimen: BLOOD  Result Value Ref Range Status   Specimen Description   Final    BLOOD LEFT WRIST Performed at [facility] [address] [place], [place] [address]    Special Requests   Final    BOTTLES DRAWN AEROBIC AND ANAEROBIC Blood Culture adequate volume Performed at [facility] [address] [place], [place] [address]    Culture   Final    NO GROWTH 1 DAY Performed at [facility], [place] [address], [place], [place] [address]    Report Status PENDING  Incomplete  Urine culture     Status: None   Collection Time: 01/16/20 11:21 AM   Specimen: In/Out Cath Urine  Result Value Ref Range Status   Specimen Description   Final    IN/OUT CATH URINE Performed at [facility] [address], [place], [place] [address]    Special Requests   Final    NONE Performed at [facility], [place] [address], [place], [place] [address]    Culture   Final    NO GROWTH Performed at [facility], [place] [address], [place], [place] [address]    Report Status 01/17/2020 FINAL  Final  Blood Culture (routine x 2)     Status: None (Preliminary result)   Collection Time: 01/16/20 11:35 AM   Specimen: BLOOD  Result Value Ref Range Status   Specimen Description   Final    BLOOD RIGHT ANTECUBITAL Performed at [facility] [address], [place], [place] [address]    Special Requests   Final    BOTTLES DRAWN AEROBIC AND ANAEROBIC Blood Culture results may not be optimal due to an excessive volume of blood received in culture bottles Performed at [facility] [address], [place], [place] [address]     Culture   Final    NO GROWTH 1 DAY Performed at [facility], [place] [address] [place], [place] [address]    Report Status PENDING  Incomplete  Respiratory Panel by PCR     Status: None   Collection Time: 01/16/20  4:06 PM   Specimen: Nasopharyngeal Swab; Respiratory  Result Value Ref Range Status   Adenovirus NOT DETECTED NOT DETECTED Final   Coronavirus 229E NOT DETECTED NOT DETECTED Final    Comment: (NOTE) The Coronavirus on the Respiratory Panel, DOES NOT test for the novel  Coronavirus (2019 nCoV)    Coronavirus HKU1 NOT DETECTED NOT DETECTED Final   Coronavirus NL63 NOT DETECTED NOT DETECTED Final   Coronavirus OC43 NOT DETECTED NOT DETECTED Final   Metapneumovirus NOT DETECTED NOT DETECTED Final   Rhinovirus / Enterovirus NOT DETECTED NOT DETECTED Final   Influenza A NOT DETECTED NOT DETECTED Final   Influenza B NOT DETECTED NOT DETECTED Final   Parainfluenza Virus 1 NOT DETECTED NOT DETECTED Final   Parainfluenza Virus 2 NOT DETECTED NOT DETECTED Final   Parainfluenza Virus 3 NOT DETECTED NOT DETECTED Final   Parainfluenza Virus 4 NOT DETECTED NOT DETECTED Final   Respiratory Syncytial Virus NOT DETECTED NOT DETECTED Final   Bordetella pertussis NOT DETECTED NOT DETECTED Final   Chlamydophila pneumoniae NOT DETECTED NOT DETECTED Final   Mycoplasma pneumoniae NOT DETECTED NOT DETECTED Final    Comment: Performed at [facility], [place]. [address] [place], [place] [address]  Culture, blood (single)     Status: None (Preliminary result)  Collection Time: 01/17/20 10:41 AM   Specimen: BLOOD  Result Value Ref Range Status   Specimen Description   Final    BLOOD LEFT ANTECUBITAL Performed at [facility] [address], [place], [place] [address]    Special Requests   Final    BOTTLES DRAWN AEROBIC AND ANAEROBIC Blood Culture adequate volume Performed at [facility] [address], [place], [place] [address]    Culture    Final    NO GROWTH <12 HOURS Performed at [facility] [address] [place], [place] [address]    Report Status PENDING  Incomplete         Radiology Studies: DG Chest 2 View  Result Date: 01/16/2020 CLINICAL DATA:  Lymphoma with cough and recurrent fever EXAM: CHEST - 2 VIEW COMPARISON:  Chest CT 08/26/2019 FINDINGS: Normal heart size and mediastinal contours. Porta catheter on the right with tip at the SVC. There is no edema, consolidation, effusion, or pneumothorax. Spondylosis. IMPRESSION: No evidence of acute disease. Electronically Signed   By: Monte Fantasia M.D.   On: 01/16/2020 10:17   CT Maxillofacial Wo Contrast  Result Date: 01/16/2020 CLINICAL DATA:  Syncope with nasal trauma. Sinus pressure, cough. History of B-cell lymphoma EXAM: CT MAXILLOFACIAL WITHOUT CONTRAST TECHNIQUE: Multidetector CT imaging of the maxillofacial structures was performed. Multiplanar CT image reconstructions were also generated. COMPARISON:  None. FINDINGS: Osseous: No acute maxillofacial bone fracture. Bony orbital walls intact. Mandible intact. Temporomandibular joints aligned without dislocation. Orbits: Negative. No traumatic or inflammatory finding. Sinuses: Paranasal sinuses are well aerated and clear. No mucosal thickening or air-fluid level. Pneumatization of the bilateral middle nasal turbinates. Bilateral mastoid air cells are clear. Soft tissues: No soft tissue swelling. No masses identified. No lacrimal gland enlargement. Limited intracranial: No significant or unexpected finding. IMPRESSION: 1. Negative for acute maxillofacial bone fracture. 2. Paranasal sinuses are well aerated and clear. Electronically Signed   By: Davina Poke D.O.   On: 01/16/2020 12:43        Scheduled Meds: . Chlorhexidine Gluconate Cloth  6 each Topical Daily  . heparin  5,000 Units Subcutaneous Q8H  . insulin aspart  0-15 Units Subcutaneous TID WC  . insulin aspart  0-5 Units Subcutaneous QHS  .  insulin glargine  30 Units Subcutaneous Daily  . pantoprazole  40 mg Oral Daily  . sodium chloride flush  3 mL Intravenous Q12H   Continuous Infusions: . ceFEPime (MAXIPIME) IV 2 g (01/17/20 0942)  . metronidazole 500 mg (01/17/20 0536)  . vancomycin 750 mg (01/17/20 0133)     LOS: 1 day     Georgette Shell, MD 01/17/2020, 1:22 PM

## 2020-01-17 NOTE — Progress Notes (Signed)
Spoke with RN regarding Clinical Skills - [facility] Note about Blood Cultures from central line. Per [facility] Note, "blood cultures should NOT be drawn from a cental line unless venipuncture not possible (attemps should be made per policy prior to using cental line).  Drawing blood from a cental line can cause a false positive of bacteria in the biofilm of the line, which is why it should be avoided unless there is no other way to get the culture."  RN will reach out to MD to clarify.  I will reach out to RN to follow-up.

## 2020-01-17 NOTE — Progress Notes (Signed)
   01/17/20 2122  Assess: MEWS Score  Temp (!) 103.2 F (39.6 C)  BP 129/62  Pulse Rate (!) 108  ECG Heart Rate (!) 108  Resp (!) 21  Level of Consciousness Alert  SpO2 94 %  O2 Device Room Air  Assess: MEWS Score  MEWS Temp 2  MEWS Systolic 0  MEWS Pulse 1  MEWS RR 1  MEWS LOC 0  MEWS Score 4  MEWS Score Color Red  Assess: if the MEWS score is Yellow or Red  Were vital signs taken at a resting state? Yes  Focused Assessment No change from prior assessment  Early Detection of Sepsis Score *See Row Information* High  MEWS guidelines implemented *See Row Information* Yes  Treat  MEWS Interventions Administered prn meds/treatments;Escalated (See documentation below)  Pain Scale 0-10  Take Vital Signs  Increase Vital Sign Frequency  Red: Q 1hr X 4 then Q 4hr X 4, if remains red, continue Q 4hrs  Escalate  MEWS: Escalate Red: discuss with charge nurse/RN and provider, consider discussing with RRT  Notify: Charge Nurse/RN  Name of Charge Nurse/RN Notified Hilda RN  Date Charge Nurse/RN Notified 01/17/20  Time Charge Nurse/RN Notified 2140  Notify: Provider  Provider Name/Title Jeannette Corpus   Date Provider Notified 01/17/20  Time Provider Notified 2130  Notification Type Call  Notification Reason Change in status  Response No new orders  Document  Progress note created (see row info) Yes

## 2020-01-17 NOTE — Progress Notes (Signed)
Spoke with RN, Lynelle Smoke.  RN states she spoke with Dr. Rodena Piety who consulted with ID and wants blood cultures drawn.

## 2020-01-17 NOTE — Consult Note (Signed)
[facility]                                                                                        Patient Identification: Patient Name: Patrick Bautista MRN: [id number] Admit Date: 01/16/2020 10:25 AM Age: @AG @Today 's Date: 01/17/2020 Reason for consult: Fevers  Active Problems:   Sepsis ([place])   Antibiotics:  Vancomycin Day 2                    Cefepime Day 2                    Metronidazole Day 2   Assessment 1. Fevers  - Unclear source, this could be infectious vs non- infectious ( lymphoma, chemotherapy , others). Patient complains of similar symptoms with prior chemo. However, this time had high grade fevers.  - ANC 900, has a port in place placed 09/19/19. Rule out Infectious Cause.  - UA clean, blood cx NG in 1 day, urine cx NG  - Procalcitonin 0.43  2. 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 Last chemo on 9/10   Recommendations  Recommend one set of blood cultures ( one from the peripheral and one from the port) US of the rt side of chest to look for possible port infection given patient complained of soreness in that area few days ago and no other sources identified.  No change in current antimicrobial coverage  Monitor fever curve   Rest of the management as per the primary team.  Please call with pertinent questions and concerns.  Rosiland Oz, MD Infectious [facility]   To contact the attending provider between 8A-5P or the covering provider during after hours 5P-8A, please log into the web site [email or web address] and access using universal [place] password for that web site. If you do not have the password, please call the hospital operator. __________________________________________________________________________________________________________ HPI and Hospital Course: Patrick Bautista is a 69 y.o. male with a  PMH significant for Left testis primary B-cell lymphoma currently on chemo with last chemo on 9/10 who presented to the ED on 9/24 with complaints of increasing fatigue and fever for 1 week. Wife is also at bedside. They say patient always feels feverish and develops sinus symptoms after the chemo. However, this time the intensity was more and fever was also had higher temperature. He initially had some drainage from the sinus. He spoke with his physician and they recommended saline flushes. This didn't help. He started having fevers up to 103 degrees. He talked with his physician again and he was given a z-pack. He completed all but the last day of the z-pack and went to this oncologist and was recommended ED follow up due to fever and low blood pressures. He denies any recent sick contacts, recent travel. Has a dog at home. He had some minimal productive cough, not too bothersome. Also had some loose stools that has now resolved. Says he had some soreness in his port site few days ago. Work up with CT sinus obtained, as well as CXR were unremarkable.  COVID screen negative. Flu screen negative. Resp panel negative. Blood cultures negative. He clinically feels better after coming to the hospital.  ROS: General- Denies  chills, loss of appetite and loss of weight HEENT - denies headache, blurry vision, neck pain Chest - denies any chest pain, SOB  CVS- Denies any dizziness, palpitations Abdomen- denies any nausea, vomiting, abdominal pain and diarrhea Neuro - Denies any weakness, numbness  Psych - denies any changes in mood irritability or depressive symptoms GU- Denies any burning, dysuria or increased frequency of urination Skin - denies any rashes MSK - denies any joint pain/swelling or ROM   Past Medical History:  Diagnosis Date  . Arthritis   . Diabetes mellitus without complication ([place])    type 2  . Diverticulitis 15 yrs ago  . History of kidney stones    right ureteral stone and one in  2017  . Hypertension   . Lumbar herniated disc    L 4 L 5    Past Surgical History:  Procedure Laterality Date  . colonscopy  15 yrs ago  . CYSTOSCOPY/URETEROSCOPY/HOLMIUM LASER/STENT PLACEMENT Right 02/16/2017   Procedure: CYSTOSCOPY/RETROGRADE/URETEROSCOPY/HOLMIUM LASER/STENT PLACEMENT;  Surgeon: Ardis Hughs, MD;  Location: [facility];  Service: Urology;  Laterality: Right;  NEEDS 60 MIN FOR PROCEDURE  . IR IMAGING GUIDED PORT INSERTION  09/19/2019  . MOUTH SURGERY  20 yrs ago  . ORCHIECTOMY Left 09/03/2019   Procedure: LEFT ORCHIECTOMY;  Surgeon: Ardis Hughs, MD;  Location: [facility];  Service: Urology;  Laterality: Left;    Scheduled Meds: . Chlorhexidine Gluconate Cloth  6 each Topical Daily  . heparin  5,000 Units Subcutaneous Q8H  . insulin aspart  0-15 Units Subcutaneous TID WC  . insulin aspart  0-5 Units Subcutaneous QHS  . insulin glargine  30 Units Subcutaneous Daily  . pantoprazole  40 mg Oral Daily  . sodium chloride flush  3 mL Intravenous Q12H   Continuous Infusions: . ceFEPime (MAXIPIME) IV 2 g (01/17/20 0942)  . metronidazole 500 mg (01/17/20 0536)  . vancomycin 750 mg (01/17/20 0133)   PRN Meds:.acetaminophen **OR** acetaminophen, guaiFENesin-dextromethorphan, ibuprofen, LORazepam, ondansetron **OR** ondansetron (ZOFRAN) IV, senna-docusate  Allergies  Allergen Reactions  . Hydrocodone Nausea And Vomiting  . Percocet [Oxycodone-Acetaminophen] Nausea And Vomiting  . Codeine Itching  . Penicillins Itching    Has patient had a PCN reaction causing immediate rash, facial/tongue/throat swelling, SOB or lightheadedness with hypotension: No Has patient had a PCN reaction causing severe rash involving mucus membranes or skin necrosis: No Has patient had a PCN reaction that required hospitalization No Has patient had a PCN reaction occurring within the last 10 years: No If all of the above answers are "NO", then may proceed with Cephalosporin use.   . Tape  Itching and Rash     Paper Tape causes blisters; please use Coban wrap if possible   Social History   Socioeconomic History  . Marital status: Legally Separated    Spouse name: Not on file  . Number of children: Not on file  . Years of education: Not on file  . Highest education level: Not on file  Occupational History  . Not on file  Tobacco Use  . Smoking status: Never Smoker  . Smokeless tobacco: Never Used  Vaping Use  . Vaping Use: Never used  Substance and Sexual Activity  . Alcohol use: No  . Drug use: No  . Sexual activity: Not on file  Other Topics Concern  . Not  on file  Social History Narrative   Pt states "Potential reaction after covid vaccination" States "Experienced left fingers numb and left inner thigh numb, left testicular sweating, all within 5 hours of vaccination".   Social Determinants of Health   Financial Resource Strain:   . Difficulty of Paying Living Expenses: Not on file  Food Insecurity:   . Worried About Charity fundraiser in the Last Year: Not on file  . Ran Out of Food in the Last Year: Not on file  Transportation Needs:   . Lack of Transportation (Medical): Not on file  . Lack of Transportation (Non-Medical): Not on file  Physical Activity:   . Days of Exercise per Week: Not on file  . Minutes of Exercise per Session: Not on file  Stress:   . Feeling of Stress : Not on file  Social Connections:   . Frequency of Communication with Friends and Family: Not on file  . Frequency of Social Gatherings with Friends and Family: Not on file  . Attends Religious Services: Not on file  . Active Member of Clubs or Organizations: Not on file  . Attends Archivist Meetings: Not on file  . Marital Status: Not on file  Intimate Partner Violence:   . Fear of Current or Ex-Partner: Not on file  . Emotionally Abused: Not on file  . Physically Abused: Not on file  . Sexually Abused: Not on file    Vitals BP (!) 99/46 (BP Location: Left  Arm)   Pulse (!) 109   Temp (!) 103 F (39.4 C) (Oral)   Resp (!) 21   Ht 5\' 10"  (1.778 m)   Wt 93.2 kg   SpO2 94%   BMI 29.47 kg/m    Examination  Gen: Alert and oriented x 3, not in acute distress HEENT: Violet/AT, PERLA, EOMI, no scleral icterus, no pale conjunctivae, hearing normal, mucosa moist Neck: Supple, no lymphadenopathy Cardio: RRR, +S1 and S Resp: CTAB; no wheezes, rhonchi, or rales GI: Soft, nontender, nondistended, bowel sounds + GU: Foley in place Musc: No joint swelling or tenderness Extremities: No cyanosis, clubbing, or edema; palpable PT and DP pulses Skin: No rashes, lesions, or ecchymoses Neuro: grossly nonfocal Psych: Calm, cooperative  LINES/TUBES: Rt sided port- no erythema/tederness or swelling   METAL IMPLANT/HARDWARE:  Pertinent Microbiology Results for orders placed or performed during the hospital encounter of 01/16/20  Respiratory Panel by RT PCR (Flu A&B, Covid) - Nasopharyngeal Swab     Status: None   Collection Time: 01/16/20 11:09 AM   Specimen: Nasopharyngeal Swab  Result Value Ref Range Status   SARS Coronavirus 2 by RT PCR NEGATIVE NEGATIVE Final    Comment: (NOTE) SARS-CoV-2 target nucleic acids are NOT DETECTED.  The SARS-CoV-2 RNA is generally detectable in upper respiratoy specimens during the acute phase of infection. The lowest concentration of SARS-CoV-2 viral copies this assay can detect is 131 copies/mL. A negative result does not preclude SARS-Cov-2 infection and should not be used as the sole basis for treatment or other patient management decisions. A negative result may occur with  improper specimen collection/handling, submission of specimen other than nasopharyngeal swab, presence of viral mutation(s) within the areas targeted by this assay, and inadequate number of viral copies (<131 copies/mL). A negative result must be combined with clinical observations, patient history, and epidemiological information.  The expected result is Negative.  Fact Sheet for Patients:  PinkCheek.be  Fact Sheet for Healthcare Providers:  GravelBags.it  This test  is no t yet approved or cleared by the [place] and  has been authorized for detection and/or diagnosis of SARS-CoV-2 by FDA under an Emergency Use Authorization (EUA). This EUA will remain  in effect (meaning this test can be used) for the duration of the COVID-19 declaration under Section 564(b)(1) of the Act, 21 U.S.C. section 360bbb-3(b)(1), unless the authorization is terminated or revoked sooner.     Influenza A by PCR NEGATIVE NEGATIVE Final   Influenza B by PCR NEGATIVE NEGATIVE Final    Comment: (NOTE) The Xpert Xpress SARS-CoV-2/FLU/RSV assay is intended as an aid in  the diagnosis of influenza from Nasopharyngeal swab specimens and  should not be used as a sole basis for treatment. Nasal washings and  aspirates are unacceptable for Xpert Xpress SARS-CoV-2/FLU/RSV  testing.  Fact Sheet for Patients: PinkCheek.be  Fact Sheet for Healthcare Providers: GravelBags.it  This test is not yet approved or cleared by the [place] FDA and  has been authorized for detection and/or diagnosis of SARS-CoV-2 by  FDA under an Emergency Use Authorization (EUA). This EUA will remain  in effect (meaning this test can be used) for the duration of the  Covid-19 declaration under Section 564(b)(1) of the Act, 21  U.S.C. section 360bbb-3(b)(1), unless the authorization is  terminated or revoked. Performed at [facility], [place] [address] [place], [place] [address]   Urine culture     Status: None   Collection Time: 01/16/20 11:21 AM   Specimen: In/Out Cath Urine  Result Value Ref Range Status   Specimen Description   Final    IN/OUT CATH URINE Performed at [facility]  [address] [place], [place] [address]    Special Requests   Final    NONE Performed at [facility], [place] [address], [place], [place] [address]    Culture   Final    NO GROWTH Performed at [facility], [place] [address] [place], [place] [address]    Report Status 01/17/2020 FINAL  Final  Respiratory Panel by PCR     Status: None   Collection Time: 01/16/20  4:06 PM   Specimen: Nasopharyngeal Swab; Respiratory  Result Value Ref Range Status   Adenovirus NOT DETECTED NOT DETECTED Final   Coronavirus 229E NOT DETECTED NOT DETECTED Final    Comment: (NOTE) The Coronavirus on the Respiratory Panel, DOES NOT test for the novel  Coronavirus (2019 nCoV)    Coronavirus HKU1 NOT DETECTED NOT DETECTED Final   Coronavirus NL63 NOT DETECTED NOT DETECTED Final   Coronavirus OC43 NOT DETECTED NOT DETECTED Final   Metapneumovirus NOT DETECTED NOT DETECTED Final   Rhinovirus / Enterovirus NOT DETECTED NOT DETECTED Final   Influenza A NOT DETECTED NOT DETECTED Final   Influenza B NOT DETECTED NOT DETECTED Final   Parainfluenza Virus 1 NOT DETECTED NOT DETECTED Final   Parainfluenza Virus 2 NOT DETECTED NOT DETECTED Final   Parainfluenza Virus 3 NOT DETECTED NOT DETECTED Final   Parainfluenza Virus 4 NOT DETECTED NOT DETECTED Final   Respiratory Syncytial Virus NOT DETECTED NOT DETECTED Final   Bordetella pertussis NOT DETECTED NOT DETECTED Final   Chlamydophila pneumoniae NOT DETECTED NOT DETECTED Final   Mycoplasma pneumoniae NOT DETECTED NOT DETECTED Final    Comment: Performed at [facility], [place] [address], [place], [place] [address]     Pertinent Lab seen by me: CBC Latest Ref Rng & Units 01/16/2020 01/02/2020 12/12/2019  WBC 4.0 - 10.5 K/uL 11.6(H) 11.7(H) 10.1  Hemoglobin 13.0 - 17.0 g/dL 8.1(L) 10.2(L) 9.9(L)  Hematocrit 39 - 52 % 23.7(L) 30.8(L) 29.5(L)  Platelets 150 - 400 K/uL 271 307 416(H)   CMP Latest Ref Rng & Units 01/16/2020 01/02/2020 12/12/2019   Glucose 70 - 99 mg/dL 258(H) 213(H) 234(H)  BUN 8 - 23 mg/dL 18 28(H) 26(H)  Creatinine 0.61 - 1.24 mg/dL 1.38(H) 1.60(H) 1.30(H)  Sodium 135 - 145 mmol/L 135 134(L) 137  Potassium 3.5 - 5.1 mmol/L 3.5 4.2 4.2  Chloride 98 - 111 mmol/L 98 101 105  CO2 22 - 32 mmol/L 24 25 23   Calcium 8.9 - 10.3 mg/dL 9.4 9.9 10.2  Total Protein 6.5 - 8.1 g/dL 6.7 7.2 6.6  Total Bilirubin 0.3 - 1.2 mg/dL 0.1(L) 0.3 0.3  Alkaline Phos 38 - 126 U/L 88 109 99  AST 15 - 41 U/L 20 13(L) 16  ALT 0 - 44 U/L 20 17 24      Pertinent Imagings/Other Imagings Plain films and CT images have been personally visualized and interpreted; radiology reports have been reviewed. Decision making incorporated into the Impression / Recommendations.  CT maxillo-facial WPO contrast 01/16/20 EXAM: CT MAXILLOFACIAL WITHOUT CONTRAST  TECHNIQUE: Multidetector CT imaging of the maxillofacial structures was performed. Multiplanar CT image reconstructions were also generated.  COMPARISON:  None.  FINDINGS: Osseous: No acute maxillofacial bone fracture. Bony orbital walls intact. Mandible intact. Temporomandibular joints aligned without dislocation.  Orbits: Negative. No traumatic or inflammatory finding.  Sinuses: Paranasal sinuses are well aerated and clear. No mucosal thickening or air-fluid level. Pneumatization of the bilateral middle nasal turbinates. Bilateral mastoid air cells are clear.  Soft tissues: No soft tissue swelling. No masses identified. No lacrimal gland enlargement.  Limited intracranial: No significant or unexpected finding.  IMPRESSION: 1. Negative for acute maxillofacial bone fracture. Chest Xray 01/16/20 EXAM: CHEST - 2 VIEW  COMPARISON:  Chest CT 08/26/2019  FINDINGS: Normal heart size and mediastinal contours. Porta catheter on the right with tip at the SVC. There is no edema, consolidation, effusion, or pneumothorax. Spondylosis.  IMPRESSION: No evidence of acute  disease.

## 2020-01-17 NOTE — Progress Notes (Signed)
°   01/17/20 0944  Assess: MEWS Score  Temp (!) 103 F (39.4 C)  BP (!) 99/46  Pulse Rate (!) 109  Resp (!) 21  SpO2 94 %  Assess: MEWS Score  MEWS Temp 2  MEWS Systolic 1  MEWS Pulse 1  MEWS RR 1  MEWS LOC 0  MEWS Score 5  MEWS Score Color Red  Assess: if the MEWS score is Yellow or Red  Were vital signs taken at a resting state? Yes  Focused Assessment No change from prior assessment  Early Detection of Sepsis Score *See Row Information* Low  MEWS guidelines implemented *See Row Information* No, previously yellow, continue vital signs every 4 hours  Treat  Pain Scale 0-10  Pain Score 0  Escalate  MEWS: Escalate Red: discuss with charge nurse/RN and provider, consider discussing with RRT  Notify: Charge Nurse/RN  Name of Charge Nurse/RN Notified Eloise Mula  Date Charge Nurse/RN Notified 01/17/20  Time Charge Nurse/RN Notified 0945  Notify: Provider  Provider Name/Title mathews   Date Provider Notified 01/17/20  Time Provider Notified [phone]  Notification Type Page  Notification Reason Change in status  Response No new orders (said to give Ibuprofen on [facility] )  Date of Provider Response 01/17/20  Time of Provider Response [phone]

## 2020-01-18 DIAGNOSIS — R05 Cough: Secondary | ICD-10-CM

## 2020-01-18 DIAGNOSIS — R531 Weakness: Secondary | ICD-10-CM

## 2020-01-18 DIAGNOSIS — C6290 Malignant neoplasm of unspecified testis, unspecified whether descended or undescended: Secondary | ICD-10-CM

## 2020-01-18 DIAGNOSIS — G501 Atypical facial pain: Secondary | ICD-10-CM

## 2020-01-18 DIAGNOSIS — A419 Sepsis, unspecified organism: Secondary | ICD-10-CM | POA: Diagnosis not present

## 2020-01-18 DIAGNOSIS — R509 Fever, unspecified: Secondary | ICD-10-CM

## 2020-01-18 LAB — CBC
HCT: 18.9 % — ABNORMAL LOW (ref 39.0–52.0)
Hemoglobin: 6.3 g/dL — CL (ref 13.0–17.0)
MCH: 31.7 pg (ref 26.0–34.0)
MCHC: 33.3 g/dL (ref 30.0–36.0)
MCV: 95 fL (ref 80.0–100.0)
Platelets: 294 10*3/uL (ref 150–400)
RBC: 1.99 MIL/uL — ABNORMAL LOW (ref 4.22–5.81)
RDW: 16.7 % — ABNORMAL HIGH (ref 11.5–15.5)
WBC: 10.6 10*3/uL — ABNORMAL HIGH (ref 4.0–10.5)
nRBC: 0.4 % — ABNORMAL HIGH (ref 0.0–0.2)

## 2020-01-18 LAB — COMPREHENSIVE METABOLIC PANEL
ALT: 18 U/L (ref 0–44)
AST: 18 U/L (ref 15–41)
Albumin: 2.5 g/dL — ABNORMAL LOW (ref 3.5–5.0)
Alkaline Phosphatase: 62 U/L (ref 38–126)
Anion gap: 10 (ref 5–15)
BUN: 14 mg/dL (ref 8–23)
CO2: 23 mmol/L (ref 22–32)
Calcium: 8.3 mg/dL — ABNORMAL LOW (ref 8.9–10.3)
Chloride: 105 mmol/L (ref 98–111)
Creatinine, Ser: 1.25 mg/dL — ABNORMAL HIGH (ref 0.61–1.24)
GFR calc Af Amer: >60 mL/min (ref >60)
GFR calc non Af Amer: 58 mL/min — ABNORMAL LOW (ref >60)
Glucose, Bld: 96 mg/dL (ref 70–99)
Potassium: 3.7 mmol/L (ref 3.5–5.1)
Sodium: 138 mmol/L (ref 135–145)
Total Bilirubin: 0.3 mg/dL (ref 0.3–1.2)
Total Protein: 5.4 g/dL — ABNORMAL LOW (ref 6.5–8.1)

## 2020-01-18 LAB — PREPARE RBC (CROSSMATCH)

## 2020-01-18 MED ORDER — SODIUM CHLORIDE 0.9% IV SOLUTION: Freq: Once | INTRAVENOUS | Status: AC

## 2020-01-18 NOTE — Plan of Care (Signed)

## 2020-01-18 NOTE — Consult Note (Addendum)
Chief Complaint: Patient was seen in consultation today for  Chief Complaint  Patient presents with  . Fever  . Cough  . Facial Pain  . Weakness    Referring Physician(s): Dr. Marin Olp  Supervising Physician: Daryll Brod  Patient Status: [facility] - In-pt  History of Present Illness: Patrick Bautista is a 69 y.o. male with a medical history of DM, kidney stones, diverticulitis and testicular cancer. In March of 2021 the patient developed left testicular swelling. A scrotal ultrasound was performed May 2021 that demonstrated a tumor or amorphous material. A left radical orchiectomy was done 09/03/2019. Surgical pathology revealed diffuse large B-cell lymphoma. IR placed a port-a-catheter 09/16/19.   Patrick Bautista was sent to the [facility] ED 01/16/20 by the [facility] for recurrent fevers, weakness, tachycardia and syncope. Covid negative. ED work up was mostly unremarkable and the patient was admitted for fever of unknown origin. The patient has completed 5 of 6 cycles of chemotherapy and per Dr. Antonieta Pert note, the patient does not wish to have any further chemotherapy.   Interventional Radiology has been asked to evaluate this patient for a port-a-catheter removal.   Past Medical History:  Diagnosis Date  . Arthritis   . Diabetes mellitus without complication ([place])    type 2  . Diverticulitis 15 yrs ago  . History of kidney stones    right ureteral stone and one in 2017  . Hypertension   . 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 colonscopy  15 yrs ago  . CYSTOSCOPY/URETEROSCOPY/HOLMIUM LASER/STENT PLACEMENT Right 02/16/2017   Procedure: CYSTOSCOPY/RETROGRADE/URETEROSCOPY/HOLMIUM LASER/STENT PLACEMENT;  Surgeon: Ardis Hughs, MD;  Location: [facility] ORS;  Service: Urology;  Laterality: Right;  NEEDS 60 MIN FOR PROCEDURE  . IR IMAGING GUIDED PORT INSERTION  09/19/2019  . MOUTH SURGERY  20 yrs ago  . ORCHIECTOMY Left 09/03/2019    Procedure: LEFT ORCHIECTOMY;  Surgeon: Ardis Hughs, MD;  Location: [facility] ORS;  Service: Urology;  Laterality: Left;    Allergies: Hydrocodone, Percocet [oxycodone-acetaminophen], Codeine, Penicillins, and Tape  Medications: Prior to Admission medications   Medication Sig Start Date End Date Taking? Authorizing Provider  acetaminophen (TYLENOL) 500 MG tablet Take 500-1,000 mg by mouth every 6 (six) hours as needed for moderate pain.    Yes [provider]  amLODipine (NORVASC) 2.5 MG tablet Take 2.5 mg by mouth daily. 2.5 mg + 5 mg =7.5 mg total daily dose   Yes [provider]  amLODipine (NORVASC) 5 MG tablet Take 5 mg by mouth daily. 2.5 mg + 5 mg =7.5 mg total daily dose 10/29/15  Yes [provider]  ergocalciferol (VITAMIN D2) 1.25 MG (50000 UT) capsule Take 1 capsule (50,000 Units total) by mouth 2 (two) times a week. 10/09/19  Yes Brunetta Genera, MD  fluocinonide cream (LIDEX) 8.18 % Apply 1 application topically 2 (two) times daily as needed (to access port).    Yes [provider]  HUMALOG KWIKPEN 100 UNIT/ML KwikPen Inject 1-5 Units into the skin See admin instructions. Sliding scale : up to 4 times a day. 09/23/19  Yes [provider]  loratadine (CLARITIN) 10 MG tablet Take 1 tablet (10 mg total) by mouth See admin instructions. Patient states he takes for 7 days: from 3rd day post chemo until 10th day post chemo Patient taking differently: Take 10 mg by mouth daily.  01/05/20  Yes Brunetta Genera,  MD  LORazepam (ATIVAN) 0.5 MG tablet TAKE 1 TABLET BY MOUTH EVERY 6 HOURS AS NEEDED (NAUSEA OR VOMITING). Patient taking differently: Take 0.5 mg by mouth every 6 (six) hours as needed for anxiety.  01/09/20  Yes Brunetta Genera, MD  olmesartan-hydrochlorothiazide (BENICAR HCT) 40-25 MG tablet Take 1 tablet by mouth daily. 07/31/19  Yes [provider]  pantoprazole (PROTONIX) 40 MG tablet TAKE 1 TABLET BY MOUTH DAILY BEFORE  BREAKFAST Patient taking differently: Take 40 mg by mouth daily.  12/25/19  Yes Brunetta Genera, MD  senna-docusate (SENNA S) 8.6-50 MG tablet Take 2 tablets by mouth at bedtime as needed for mild constipation or moderate constipation. 10/22/19  Yes Kale, Cloria [place], MD  TRESIBA FLEXTOUCH 100 UNIT/ML FlexTouch Pen Inject 40 Units into the skin daily.  09/23/19  Yes [provider]  B-D UF III MINI PEN NEEDLES 31G X 5 MM MISC Inject into the skin 4 (four) times daily. 11/04/19   [provider]  citalopram (CELEXA) 10 MG tablet TAKE 1 TABLET BY MOUTH EVERY DAY Patient not taking: Reported on 01/16/2020 10/09/19   Brunetta Genera, MD  lidocaine-prilocaine (EMLA) cream Apply to affected area once Patient not taking: Reported on 01/16/2020 10/06/19   Brunetta Genera, MD  ondansetron (ZOFRAN) 8 MG tablet Take 1 tablet (8 mg total) by mouth 2 (two) times daily as needed for refractory nausea / vomiting. Start on day 3 after cyclophosphamide chemotherapy. Patient not taking: Reported on 01/16/2020 10/06/19   Brunetta Genera, MD  [facility] VERIO test strip 2 (two) times daily. 10/26/19   [provider]  predniSONE (DELTASONE) 20 MG tablet Take 3 tablets (60 mg total) by mouth daily with breakfast. Take with food on days 1-5 of chemotherapy. Patient not taking: Reported on 01/16/2020 10/06/19   Brunetta Genera, MD  prochlorperazine (COMPAZINE) 10 MG tablet Take 1 tablet (10 mg total) by mouth every 6 (six) hours as needed (Nausea or vomiting). Patient not taking: Reported on 01/16/2020 10/06/19   Brunetta Genera, MD  traMADol (ULTRAM) 50 MG tablet Take 1-2 tablets (50-100 mg total) by mouth every 6 (six) hours as needed for moderate pain. Patient not taking: Reported on 01/16/2020 09/03/19   Ardis Hughs, MD     History reviewed. No pertinent family history.  Social History   Socioeconomic History  . Marital status: Legally Separated    Spouse name:  Not on file  . Number of children: Not on file  . Years of education: Not on file  . Highest education level: Not on file  Occupational History  . Not on file  Tobacco Use  . Smoking status: Never Smoker  . Smokeless tobacco: Never Used  Vaping Use  . Vaping Use: Never used  Substance and Sexual Activity  . Alcohol use: No  . Drug use: No  . Sexual activity: Not on file  Other Topics Concern  . Not on file  Social History Narrative   Pt states "Potential reaction after covid vaccination" States "Experienced left fingers numb and left inner thigh numb, left testicular sweating, all within 5 hours of vaccination".   Social Determinants of Health   Financial Resource Strain:   . Difficulty of Paying Living Expenses: Not on file  Food Insecurity:   . Worried About Charity fundraiser in the Last Year: Not on file  . Ran Out of Food in the Last Year: Not on file  Transportation Needs:   .  Lack of Transportation (Medical): Not on file  . Lack of Transportation (Non-Medical): Not on file  Physical Activity:   . Days of Exercise per Week: Not on file  . Minutes of Exercise per Session: Not on file  Stress:   . Feeling of Stress : Not on file  Social Connections:   . Frequency of Communication with Friends and Family: Not on file  . Frequency of Social Gatherings with Friends and Family: Not on file  . Attends Religious Services: Not on file  . Active Member of Clubs or Organizations: Not on file  . Attends Archivist Meetings: Not on file  . Marital Status: Not on file    Review of Systems: A 12 point ROS discussed and pertinent positives are indicated in the HPI above.  All other systems are negative.  Review of Systems  Constitutional: Positive for appetite change and fatigue.  Respiratory: Positive for cough and shortness of breath.   Cardiovascular: Negative for chest pain and leg swelling.  Gastrointestinal: Negative for abdominal pain, diarrhea, nausea and  vomiting.  Musculoskeletal: Negative for back pain.  Neurological: Positive for light-headedness. Negative for headaches.    Vital Signs: BP (!) 96/59 (BP Location: Left Arm)   Pulse 84   Temp 98.4 F (36.9 C) (Oral)   Resp 16   Ht 5\' 10"  (1.778 m)   Wt 205 lb 6.4 oz (93.2 kg)   SpO2 96%   BMI 29.47 kg/m   Physical Exam Constitutional:      General: He is not in acute distress.    Appearance: He is ill-appearing.  HENT:     Mouth/Throat:     Mouth: Mucous membranes are moist.     Pharynx: Oropharynx is clear.  Cardiovascular:     Rate and Rhythm: Normal rate and regular rhythm.     Pulses: Normal pulses.     Heart sounds: Normal heart sounds.     Comments: Right PAC - currently accessed.  Pulmonary:     Effort: Pulmonary effort is normal.     Breath sounds: Decreased breath sounds present.  Abdominal:     General: Abdomen is protuberant. Bowel sounds are normal.  Skin:    General: Skin is warm and dry.  Neurological:     Mental Status: He is alert and oriented to person, place, and time.     Imaging: DG Chest 2 View  Result Date: 01/16/2020 CLINICAL DATA:  Lymphoma with cough and recurrent fever EXAM: CHEST - 2 VIEW COMPARISON:  Chest CT 08/26/2019 FINDINGS: Normal heart size and mediastinal contours. Porta catheter on the right with tip at the SVC. There is no edema, consolidation, effusion, or pneumothorax. Spondylosis. IMPRESSION: No evidence of acute disease. Electronically Signed   By: Monte Fantasia M.D.   On: 01/16/2020 10:17   DG CHEST PORT 1 VIEW  Result Date: 01/17/2020 CLINICAL DATA:  Shortness of breath. EXAM: PORTABLE CHEST 1 VIEW COMPARISON:  January 16, 2020 FINDINGS: Stable position of the injectable port. Cardiomediastinal silhouette is normal. Mediastinal contours appear intact. Subtle peribronchial airspace opacities with lower lobe predominance, left greater than right. Osseous structures are without acute abnormality. Soft tissues are grossly  normal. IMPRESSION: Subtle peribronchial airspace opacities with lower lobe predominance, left greater than right may represent early or atypical pneumonia. Electronically Signed   By: Fidela Salisbury M.D.   On: 01/17/2020 20:12   DG Fluoro Guide Spinal/SI Jt Inj Left  Result Date: 12/26/2019 Etheleen Mayhew, MD  12/26/2019  2:56 PM Lumbar puncture performed at L3-L4 for intrathecal chemotherapy infusion.  12 mg methotrexate administered intrathecally.  No complications.  Please see dictation in PACS for additional details.   [place] CHEST SOFT TISSUE  Result Date: 01/17/2020 CLINICAL DATA:  Infection due to Port-A-Cath. EXAM: ULTRASOUND OF HEAD/NECK SOFT TISSUES TECHNIQUE: Ultrasound examination of the head and neck soft tissues was performed in the area of clinical concern. COMPARISON:  None. FINDINGS: Targeted sonographic evaluation in the area of clinical concern in the right chest, area around the port through the nipple were scanned. No evidence of fluid collection adjacent to the port or catheter. No significant skin thickening or edema. IMPRESSION: No evidence of fluid collection in the right chest wall related to chest port. Electronically Signed   By: Keith Rake M.D.   On: 01/17/2020 18:21   CT Maxillofacial Wo Contrast  Result Date: 01/16/2020 CLINICAL DATA:  Syncope with nasal trauma. Sinus pressure, cough. History of B-cell lymphoma EXAM: CT MAXILLOFACIAL WITHOUT CONTRAST TECHNIQUE: Multidetector CT imaging of the maxillofacial structures was performed. Multiplanar CT image reconstructions were also generated. COMPARISON:  None. FINDINGS: Osseous: No acute maxillofacial bone fracture. Bony orbital walls intact. Mandible intact. Temporomandibular joints aligned without dislocation. Orbits: Negative. No traumatic or inflammatory finding. Sinuses: Paranasal sinuses are well aerated and clear. No mucosal thickening or air-fluid level. Pneumatization of the bilateral middle nasal  turbinates. Bilateral mastoid air cells are clear. Soft tissues: No soft tissue swelling. No masses identified. No lacrimal gland enlargement. Limited intracranial: No significant or unexpected finding. IMPRESSION: 1. Negative for acute maxillofacial bone fracture. 2. Paranasal sinuses are well aerated and clear. Electronically Signed   By: Davina Poke D.O.   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 EXAM: DIAGNOSTIC LUMBAR PUNCTURE UNDER FLUOROSCOPIC GUIDANCE INTRATHECAL CHEMOTHERAPY INJECTION FLUOROSCOPY TIME:  Fluoroscopy Time:  1 minutes and 24 seconds Radiation Exposure Index (if provided by the fluoroscopic device): 13.6 mGy PROCEDURE: Informed consent was obtained from the patient prior to the procedure, including potential complications of headache, allergy, and pain. With the patient prone, the lower back was prepped with Betadine. 1% Lidocaine was used for local anesthesia. Lumbar puncture was performed at the L3-L4 level using a 20 gauge needle with return of clear CSF. No CSF was obtained for laboratory studies. 12 mg of methotrexate was injected intrathecally. The patient tolerated the procedure well and there were no apparent complications. IMPRESSION: 1. Successful uncomplicated fluoroscopic guided lumbar puncture for intrathecal chemotherapy injection, as above. Electronically Signed   By: Vinnie Langton M.D.   On: 12/26/2019 14:43    Labs:  CBC: Recent Labs    12/12/19 0905 01/02/20 0845 01/16/20 1108 01/18/20 0341  WBC 10.1 11.7* 11.6* 10.6*  HGB 9.9* 10.2* 8.1* 6.3*  HCT 29.5* 30.8* 23.7* 18.9*  PLT 416* 307 271 294    COAGS: Recent Labs    01/16/20 1108 01/17/20 0042  INR 1.1 1.2  APTT 41*  --     BMP: Recent Labs    12/12/19 0905 01/02/20 0845 01/16/20 1108 01/18/20 0341  NA 137 134* 135 138  K 4.2 4.2  3.5 3.7  CL 105 101 98 105  CO2 23 25 24 23   GLUCOSE 234* 213* 258* 96  BUN 26* 28* 18 14  CALCIUM 10.2 9.9 9.4 8.3*  CREATININE 1.30* 1.60* 1.38* 1.25*  GFRNONAA 55* 43* 51* 58*  GFRAA >60 50* 60* >60    LIVER FUNCTION TESTS: Recent Labs    12/12/19 0905 01/02/20 0845 01/16/20 1108 01/18/20 0341  BILITOT 0.3 0.3 0.1* 0.3  AST 16 13* 20 18  ALT 24 17 20 18   ALKPHOS 99 109 88 62  PROT 6.6 7.2 6.7 5.4*  ALBUMIN 3.7 3.8 3.4* 2.5*    TUMOR MARKERS: No results for input(s): AFPTM, CEA, CA199, CHROMGRNA in the last 8760 hours.  Assessment and Plan:  Hx of B-cell lymphoma and port-a-catheter placement; now with fever of unknown origin. Patient has completed 5 of 6 rounds of chemotherapy and does not wish for any further treatment. Patient and Dr. Marin Olp request for [facility] removal.   Risks and benefits of port-a-catheter removal were discussed with the patient including, but not limited to bleeding and infection.  All of the patient's questions were answered, patient is agreeable to proceed.  The patient will be NPO after midnight. The patient is getting scheduled IV vancomycin, flagyl and cefepime. He is on subcutaneous heparin which will be held the appropriate length of time.   Consent signed and in chart.  Thank you for this interesting consult.  I greatly enjoyed meeting Patrick Bautista and look forward to participating in their care.  A copy of this report was sent to the requesting provider on this date.  Electronically Signed: Soyla Dryer, AGACNP-BC [phone] 01/18/2020, 11:14 AM   I spent a total of 20 Minutes    in face to face in clinical consultation, greater than 50% of which was counseling/coordinating care for port-a-catheter removal.

## 2020-01-18 NOTE — Progress Notes (Signed)
PROGRESS NOTE    Patrick Bautista  YKZ:[id number] DOB: 1950-08-11 DOA: 01/16/2020 PCP: [name], L.[name], MD  Brief Narrative:Patrick Bautista is a 69 y.o. male with medical history significant of B-cell lymphoma. Presents with increasing fatigue and fever. Reports symptoms for last week. Initially started with sinus drainage. He spoke with his physician and they recommended saline flushes. This didn't help. He started having fevers up to 103 degrees. He talked with his physician again and he was given a z-pack. He completed all but the last day of the z-pack. He went to this oncologist and was recommended ED follow up due to fever and low blood pressures. He denies any sick contacts. He denies any other alleviating or aggravating factors.   ED Course: Workup revealed fevers, elevated RR, and elevated HR. CT sinus obtained, as well as CXR. Both were unremarkable. COVID screen negative. Flu screen negative. EDP spoke with oncology. TRH called for admission.     Assessment & Plan:   Active Problems:   Sepsis ([place])    #1 sepsis of unclear etiology present on admission-patient presented with fever T-max 103 tachycardic heart rate 105 tachypneic respiratory rate 24 blood pressure 99/55 hypotensive. Panculture done in the ER started on Vanco cefepime and Flagyl. Patient is also immunosuppressed from recent chemo with history of lymphoma non-Hodgkin's lymphoma He does not have leukocytosis or lactic acidosis procalcitonin is 0.48 however this may be masked due to immunocompromise state. Blood culture from the periphery and port no growth so far Tylenol and Motrin for fever. Chest x-ray clear UA negative Repeat chest x-ray 01/17/2020?  Early pneumonia Covid negative flu negative respiratory panel negative  CT of the sinuses is clear Patient has no diarrhea ?viral fever ?fungal infection ?tumor fever Appreciate ID and oncology input.  Port-A-Cath to be taken out by IR today.  Patient still spiking temp  though temperature curve is coming down.  #2 history of non-Hodgkin's lymphoma status post chemo 2 [place] ago, patient received intrathecal methotrexate and R CHOP.  #3 type 2 diabetes on SSI CBG (last 3)  Recent Labs    01/16/20 2244  GLUCAP 133*     #4 hypertension patient with a history of hypertension but now he is hypotensive likely from sepsis hold home meds.  #5  Anemia his hemoglobin dropped to 6.3 from 8.1 without any active bleeding noted.  Will check FOBT transfuse 1 unit of packed RBC.  This could likely be also secondary to the recent chemo.   Estimated body mass index is 29.47 kg/m as calculated from the following:   Height as of this encounter: 5\' 10"  (1.778 m).   Weight as of this encounter: 93.2 kg.  DVT prophylaxis: Heparin Code Status: Partial code Family Communication: None at bedside  disposition Plan:  Status is: Inpatient  Dispo: The patient is from: Home              Anticipated d/c is to: Home              Anticipated d/c date is: > 3 days              Patient currently is not medically stable to d/c.   Consultants:    id  Procedures: none Antimicrobials: Anti-infectives (From admission, onward)   Start     Dose/Rate Route Frequency Ordered Stop   01/17/20 0000  vancomycin (VANCOREADY) IVPB 750 mg/150 mL        750 mg 150 mL/hr over 60 Minutes Intravenous Every 12 hours  01/16/20 1917     01/16/20 2215  metroNIDAZOLE (FLAGYL) IVPB 500 mg  Status:  Discontinued        500 mg 100 mL/hr over 60 Minutes Intravenous Every 8 hours 01/16/20 2127 01/16/20 2131   01/16/20 2200  ceFEPIme (MAXIPIME) 2 g in sodium chloride 0.9 % 100 mL IVPB        2 g 200 mL/hr over 30 Minutes Intravenous Every 12 hours 01/16/20 1910     01/16/20 2200  metroNIDAZOLE (FLAGYL) IVPB 500 mg        500 mg 100 mL/hr over 60 Minutes Intravenous Every 8 hours 01/16/20 2041     01/16/20 1145  vancomycin (VANCOREADY) IVPB 1750 mg/350 mL        1,750 mg 175 mL/hr over 120  Minutes Intravenous  Once 01/16/20 1125 01/16/20 1508   01/16/20 1115  ceFEPIme (MAXIPIME) 2 g in sodium chloride 0.9 % 100 mL IVPB        2 g 200 mL/hr over 30 Minutes Intravenous  Once 01/16/20 1109 01/16/20 1238   01/16/20 1115  metroNIDAZOLE (FLAGYL) IVPB 500 mg        500 mg 100 mL/hr over 60 Minutes Intravenous  Once 01/16/20 1109 01/16/20 1314   01/16/20 1115  vancomycin (VANCOCIN) IVPB 1000 mg/200 mL premix  Status:  Discontinued        1,000 mg 200 mL/hr over 60 Minutes Intravenous  Once 01/16/20 1109 01/16/20 1125       Subjective: Patient resting in bed has complained of a cough ever since ultrasound of the right upper chest was done.  Still having fever however temperature curve is coming down.  Chest x-ray done shows possible early pneumonia. Objective: Vitals:   01/18/20 0126 01/18/20 0529 01/18/20 0901 01/18/20 1031  BP: (!) 101/43 (!) 116/58 (!) 111/59 (!) 96/59  Pulse: 88 (!) 108 90 84  Resp: 15 17 16 16   Temp: 98.7 F (37.1 C) (!) 101 F (38.3 C) 98.5 F (36.9 C) 98.4 F (36.9 C)  TempSrc: Oral Oral Oral Oral  SpO2: (!) 86% 91% 92% 96%  Weight:      Height:        Intake/Output Summary (Last 24 hours) at 01/18/2020 1113 Last data filed at 01/18/2020 0900 Gross per 24 hour  Intake 568.68 ml  Output 1000 ml  Net -431.32 ml   Filed Weights   01/16/20 1044 01/16/20 1351  Weight: 93.2 kg 93.2 kg    Examination: Right upper chest port in place with no erythema surrounding it no tenderness General exam: Appears calm and comfortable  Respiratory system: Few scattered rhonchi otherwise clear to auscultation. Respiratory effort normal. Cardiovascular system: S1 & S2 heard, RRR. No JVD, murmurs, rubs, gallops or clicks. No pedal edema. Gastrointestinal system: Abdomen is nondistended, soft and nontender. No organomegaly or masses felt. Normal bowel sounds heard. Central nervous system: Alert and oriented. No focal neurological deficits. Extremities: Symmetric  5 x 5 power. Skin: No rashes, lesions or ulcers Psychiatry: Judgement and insight appear normal. Mood & affect appropriate.     Data Reviewed: I have personally reviewed following labs and imaging studies  CBC: Recent Labs  Lab 01/16/20 1108 01/18/20 0341  WBC 11.6* 10.6*  NEUTROABS 9.0*  --   HGB 8.1* 6.3*  HCT 23.7* 18.9*  MCV 94.0 95.0  PLT 271 785   Basic Metabolic Panel: Recent Labs  Lab 01/16/20 1108 01/18/20 0341  NA 135 138  K 3.5 3.7  CL 98  105  CO2 24 23  GLUCOSE 258* 96  BUN 18 14  CREATININE 1.38* 1.25*  CALCIUM 9.4 8.3*   GFR: Estimated Creatinine Clearance: 63.1 mL/min (A) (by C-G formula based on SCr of 1.25 mg/dL (H)). Liver Function Tests: Recent Labs  Lab 01/16/20 1108 01/18/20 0341  AST 20 18  ALT 20 18  ALKPHOS 88 62  BILITOT 0.1* 0.3  PROT 6.7 5.4*  ALBUMIN 3.4* 2.5*   No results for input(s): LIPASE, AMYLASE in the last 168 hours. No results for input(s): AMMONIA in the last 168 hours. Coagulation Profile: Recent Labs  Lab 01/16/20 1108 01/17/20 0042  INR 1.1 1.2   Cardiac Enzymes: No results for input(s): CKTOTAL, CKMB, CKMBINDEX, TROPONINI in the last 168 hours. BNP (last 3 results) No results for input(s): PROBNP in the last 8760 hours. HbA1C: Recent Labs    01/16/20 2141  HGBA1C 9.6*   CBG: Recent Labs  Lab 01/16/20 2244  GLUCAP 133*   Lipid Profile: No results for input(s): CHOL, HDL, LDLCALC, TRIG, CHOLHDL, LDLDIRECT in the last 72 hours. Thyroid Function Tests: No results for input(s): TSH, T4TOTAL, FREET4, T3FREE, THYROIDAB in the last 72 hours. Anemia Panel: No results for input(s): VITAMINB12, FOLATE, FERRITIN, TIBC, IRON, RETICCTPCT in the last 72 hours. Sepsis Labs: Recent Labs  Lab 01/16/20 1108 01/16/20 2146 01/17/20 0042  PROCALCITON  --   --  0.43  LATICACIDVEN 1.5 1.5 1.5    Recent Results (from the past 240 hour(s))  Respiratory Panel by RT PCR (Flu A&B, Covid) - Nasopharyngeal Swab      Status: None   Collection Time: 01/16/20 11:09 AM   Specimen: Nasopharyngeal Swab  Result Value Ref Range Status   SARS Coronavirus 2 by RT PCR NEGATIVE NEGATIVE Final    Comment: (NOTE) SARS-CoV-2 target nucleic acids are NOT DETECTED.  The SARS-CoV-2 RNA is generally detectable in upper respiratoy specimens during the acute phase of infection. The lowest concentration of SARS-CoV-2 viral copies this assay can detect is 131 copies/mL. A negative result does not preclude SARS-Cov-2 infection and should not be used as the sole basis for treatment or other patient management decisions. A negative result may occur with  improper specimen collection/handling, submission of specimen other than nasopharyngeal swab, presence of viral mutation(s) within the areas targeted by this assay, and inadequate number of viral copies (<131 copies/mL). A negative result must be combined with clinical observations, patient history, and epidemiological information. The expected result is Negative.  Fact Sheet for Patients:  PinkCheek.be  Fact Sheet for Healthcare Providers:  GravelBags.it  This test is no t yet approved or cleared by the [place] FDA and  has been authorized for detection and/or diagnosis of SARS-CoV-2 by FDA under an Emergency Use Authorization (EUA). This EUA will remain  in effect (meaning this test can be used) for the duration of the COVID-19 declaration under Section 564(b)(1) of the Act, 21 U.S.C. section 360bbb-3(b)(1), unless the authorization is terminated or revoked sooner.     Influenza A by PCR NEGATIVE NEGATIVE Final   Influenza B by PCR NEGATIVE NEGATIVE Final    Comment: (NOTE) The Xpert Xpress SARS-CoV-2/FLU/RSV assay is intended as an aid in  the diagnosis of influenza from Nasopharyngeal swab specimens and  should not be used as a sole basis for treatment. Nasal washings and  aspirates are  unacceptable for Xpert Xpress SARS-CoV-2/FLU/RSV  testing.  Fact Sheet for Patients: PinkCheek.be  Fact Sheet for Healthcare Providers: GravelBags.it  This test is  not yet approved or cleared by the [place] and  has been authorized for detection and/or diagnosis of SARS-CoV-2 by  FDA under an Emergency Use Authorization (EUA). This EUA will remain  in effect (meaning this test can be used) for the duration of the  Covid-19 declaration under Section 564(b)(1) of the Act, 21  U.S.C. section 360bbb-3(b)(1), unless the authorization is  terminated or revoked. Performed at [facility], [place] [address] [place], [place] [address]   Blood Culture (routine x 2)     Status: None (Preliminary result)   Collection Time: 01/16/20 11:13 AM   Specimen: BLOOD  Result Value Ref Range Status   Specimen Description   Final    BLOOD LEFT WRIST Performed at [facility] [address] [place], [place] [address]    Special Requests   Final    BOTTLES DRAWN AEROBIC AND ANAEROBIC Blood Culture adequate volume Performed at [facility] [address] [place], [place] [address]    Culture   Final    NO GROWTH 2 DAYS Performed at [facility] [address], [place], [place] [address]    Report Status PENDING  Incomplete  Urine culture     Status: None   Collection Time: 01/16/20 11:21 AM   Specimen: In/Out Cath Urine  Result Value Ref Range Status   Specimen Description   Final    IN/OUT CATH URINE Performed at [facility] [address] [place], [place] [address]    Special Requests   Final    NONE Performed at [facility], [place] [address], [place], [place] [address]    Culture   Final    NO GROWTH Performed at [facility], [place] [address] [place], [place] [address]    Report Status 01/17/2020 FINAL  Final   Blood Culture (routine x 2)     Status: None (Preliminary result)   Collection Time: 01/16/20 11:35 AM   Specimen: BLOOD  Result Value Ref Range Status   Specimen Description   Final    BLOOD RIGHT ANTECUBITAL Performed at [facility] [address], [place], [place] [address]    Special Requests   Final    BOTTLES DRAWN AEROBIC AND ANAEROBIC Blood Culture results may not be optimal due to an excessive volume of blood received in culture bottles Performed at [facility] [address], [place], [place] [address]    Culture   Final    NO GROWTH 2 DAYS Performed at [facility] [address], [place], [place] [address]    Report Status PENDING  Incomplete  Respiratory Panel by PCR     Status: None   Collection Time: 01/16/20  4:06 PM   Specimen: Nasopharyngeal Swab; Respiratory  Result Value Ref Range Status   Adenovirus NOT DETECTED NOT DETECTED Final   Coronavirus 229E NOT DETECTED NOT DETECTED Final    Comment: (NOTE) The Coronavirus on the Respiratory Panel, DOES NOT test for the novel  Coronavirus (2019 nCoV)    Coronavirus HKU1 NOT DETECTED NOT DETECTED Final   Coronavirus NL63 NOT DETECTED NOT DETECTED Final   Coronavirus OC43 NOT DETECTED NOT DETECTED Final   Metapneumovirus NOT DETECTED NOT DETECTED Final   Rhinovirus / Enterovirus NOT DETECTED NOT DETECTED Final   Influenza A NOT DETECTED NOT DETECTED Final   Influenza B NOT DETECTED NOT DETECTED Final   Parainfluenza Virus 1  NOT DETECTED NOT DETECTED Final   Parainfluenza Virus 2 NOT DETECTED NOT DETECTED Final   Parainfluenza Virus 3 NOT DETECTED NOT DETECTED Final   Parainfluenza Virus 4 NOT DETECTED NOT DETECTED Final   Respiratory Syncytial Virus NOT DETECTED NOT DETECTED Final   Bordetella pertussis NOT DETECTED NOT DETECTED Final   Chlamydophila pneumoniae NOT DETECTED NOT DETECTED Final   Mycoplasma pneumoniae NOT DETECTED NOT DETECTED Final    Comment:  Performed at [facility], [place] [address] [place], [place] [address]  Culture, blood (single)     Status: None (Preliminary result)   Collection Time: 01/17/20 10:41 AM   Specimen: BLOOD  Result Value Ref Range Status   Specimen Description   Final    BLOOD LEFT ANTECUBITAL Performed at [facility] [address], [place], [place] [address]    Special Requests   Final    BOTTLES DRAWN AEROBIC AND ANAEROBIC Blood Culture adequate volume Performed at [facility] [address] [place], [place] [address]    Culture   Final    NO GROWTH < 24 HOURS Performed at [facility] [address], [place],  [address]    Report Status PENDING  Incomplete         Radiology Studies: DG CHEST PORT 1 VIEW  Result Date: 01/17/2020 CLINICAL DATA:  Shortness of breath. EXAM: PORTABLE CHEST 1 VIEW COMPARISON:  January 16, 2020 FINDINGS: Stable position of the injectable port. Cardiomediastinal silhouette is normal. Mediastinal contours appear intact. Subtle peribronchial airspace opacities with lower lobe predominance, left greater than right. Osseous structures are without acute abnormality. Soft tissues are grossly normal. IMPRESSION: Subtle peribronchial airspace opacities with lower lobe predominance, left greater than right may represent early or atypical pneumonia. Electronically Signed   By: Fidela Salisbury M.D.   On: 01/17/2020 20:12   [place] CHEST SOFT TISSUE  Result Date: 01/17/2020 CLINICAL DATA:  Infection due to Port-A-Cath. EXAM: ULTRASOUND OF HEAD/NECK SOFT TISSUES TECHNIQUE: Ultrasound examination of the head and neck soft tissues was performed in the area of clinical concern. COMPARISON:  None. FINDINGS: Targeted sonographic evaluation in the area of clinical concern in the right chest, area around the port through the nipple were scanned. No evidence of fluid collection adjacent to the port or catheter. No significant skin  thickening or edema. IMPRESSION: No evidence of fluid collection in the right chest wall related to chest port. Electronically Signed   By: Keith Rake M.D.   On: 01/17/2020 18:21   CT Maxillofacial Wo Contrast  Result Date: 01/16/2020 CLINICAL DATA:  Syncope with nasal trauma. Sinus pressure, cough. History of B-cell lymphoma EXAM: CT MAXILLOFACIAL WITHOUT CONTRAST TECHNIQUE: Multidetector CT imaging of the maxillofacial structures was performed. Multiplanar CT image reconstructions were also generated. COMPARISON:  None. FINDINGS: Osseous: No acute maxillofacial bone fracture. Bony orbital walls intact. Mandible intact. Temporomandibular joints aligned without dislocation. Orbits: Negative. No traumatic or inflammatory finding. Sinuses: Paranasal sinuses are well aerated and clear. No mucosal thickening or air-fluid level. Pneumatization of the bilateral middle nasal turbinates. Bilateral mastoid air cells are clear. Soft tissues: No soft tissue swelling. No masses identified. No lacrimal gland enlargement. Limited intracranial: No significant or unexpected finding. IMPRESSION: 1. Negative for acute maxillofacial bone fracture. 2. Paranasal sinuses are well aerated and clear. Electronically Signed   By: Davina Poke D.O.   On: 01/16/2020 12:43        Scheduled Meds: . benzonatate  100 mg Oral  TID  . Chlorhexidine Gluconate Cloth  6 each Topical Daily  . heparin  5,000 Units Subcutaneous Q8H  . insulin aspart  0-15 Units Subcutaneous TID WC  . insulin aspart  0-5 Units Subcutaneous QHS  . insulin glargine  30 Units Subcutaneous Daily  . pantoprazole  40 mg Oral Daily  . sodium chloride flush  10-40 mL Intracatheter Q12H  . sodium chloride flush  3 mL Intravenous Q12H   Continuous Infusions: . ceFEPime (MAXIPIME) IV 2 g (01/18/20 0907)  . metronidazole 500 mg (01/18/20 0542)  . vancomycin 750 mg (01/18/20 0033)     LOS: 2 days     Georgette Shell, MD 01/18/2020, 11:13 AM

## 2020-01-18 NOTE — Progress Notes (Signed)
[facility] Infectious Diseases Follow Up Note  Patient Identification: Patient Name: Patrick Bautista MRN: [id number] Admit Date: 01/16/2020 10:25 AM Age: 69 y.o.Today's Date: 01/18/2020   Reason for Visit: Follow up on fevers  Active Problems:   Sepsis ([place])  Antibiotics:  Vancomycin Day 3                    Cefepime Day 3                    Metronidazole Day 3  Assessment 1. Fevers  - fever curve has been trending down. Complains of some mild cough with Chest xray 9/25 with findings of early PNA. His chest Xray on admit did not have PNA. Possible health care acquired PNA - [place] of chest did not show any evidence of port infection   2. 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 Last chemo on 9/10   Recommendations  - DC metronidazole and continue Vanc and cefepime for coverage of HAP  - Plans for removal of portacath noted with no plans for further chemo and possible radiation therapy. Please send tip for cultures when removed - Sputum cx if able to produce phlegm - Follow fever curve - Monitor CBC and CMP while on IV abx   Dr Juleen China will follow tomorrow.   Rest of the management as per the primary team. Thank you for the consult. Please page with pertinent questions or concerns.  Rosiland Oz, MD Infectious [facility]   To contact the attending provider between 8A-5P or the covering provider during after hours 5P-8A, please log into the web site [email or web address] and access using universal [place] password for that web site. If you do not have the password, please call the hospital operator. ______________________________________________________________________ Subjective patient seen and examined at the bedside. He is lying in bed and getting blood transfused. He is still febrile and has got tylenol. Some cough but otherwise feels well than yesterday. He  feels better that he doesnot need further chemo.   Objective BP 114/68   Pulse 92   Temp 98.9 F (37.2 C) (Oral)   Resp 18   Ht 5\' 10"  (1.778 m)   Wt 93.2 kg   SpO2 92%   BMI 29.47 kg/m    Gen: Alert and oriented x 3, not in acute distress HEENT: [place]/AT, PERLA, EOMI, no scleral icterus, no pale conjunctivae, hearing normal, mucosa moist Neck: Supple, no lymphadenopathy Cardio: RRR, +S1 and S Resp: CTAB; CRACKLES AT THE BASES  GI: Soft, nontender, nondistended, bowel sounds + GU: Foley in place Musc: No joint swelling or tenderness Extremities: No cyanosis, clubbing, or edema; palpable PT and DP pulses Skin: No rashes, lesions, or ecchymoses Neuro: grossly nonfocal Psych: Calm, cooperative  LINES/TUBES: Rt sided port- no erythema/tederness or swelling   Microbiology Results for orders placed or performed during the hospital encounter of 01/16/20  Respiratory Panel by RT PCR (Flu A&B, Covid) - Nasopharyngeal Swab     Status: None   Collection Time: 01/16/20 11:09 AM   Specimen: Nasopharyngeal Swab  Result Value Ref Range Status   SARS Coronavirus 2 by RT PCR NEGATIVE NEGATIVE Final    Comment: (NOTE) SARS-CoV-2 target nucleic acids are NOT DETECTED.  The SARS-CoV-2 RNA is generally detectable in upper respiratoy specimens during the acute phase of infection. The lowest concentration of SARS-CoV-2 viral copies this assay can detect is 131 copies/mL. A negative result does  not preclude SARS-Cov-2 infection and should not be used as the sole basis for treatment or other patient management decisions. A negative result may occur with  improper specimen collection/handling, submission of specimen other than nasopharyngeal swab, presence of viral mutation(s) within the areas targeted by this assay, and inadequate number of viral copies (<131 copies/mL). A negative result must be combined with clinical observations, patient history, and epidemiological information.  The expected result is Negative.  Fact Sheet for Patients:  PinkCheek.be  Fact Sheet for Healthcare Providers:  GravelBags.it  This test is no t yet approved or cleared by the [place] FDA and  has been authorized for detection and/or diagnosis of SARS-CoV-2 by FDA under an Emergency Use Authorization (EUA). This EUA will remain  in effect (meaning this test can be used) for the duration of the COVID-19 declaration under Section 564(b)(1) of the Act, 21 U.S.C. section 360bbb-3(b)(1), unless the authorization is terminated or revoked sooner.     Influenza A by PCR NEGATIVE NEGATIVE Final   Influenza B by PCR NEGATIVE NEGATIVE Final    Comment: (NOTE) The Xpert Xpress SARS-CoV-2/FLU/RSV assay is intended as an aid in  the diagnosis of influenza from Nasopharyngeal swab specimens and  should not be used as a sole basis for treatment. Nasal washings and  aspirates are unacceptable for Xpert Xpress SARS-CoV-2/FLU/RSV  testing.  Fact Sheet for Patients: PinkCheek.be  Fact Sheet for Healthcare Providers: GravelBags.it  This test is not yet approved or cleared by the [place] FDA and  has been authorized for detection and/or diagnosis of SARS-CoV-2 by  FDA under an Emergency Use Authorization (EUA). This EUA will remain  in effect (meaning this test can be used) for the duration of the  Covid-19 declaration under Section 564(b)(1) of the Act, 21  U.S.C. section 360bbb-3(b)(1), unless the authorization is  terminated or revoked. Performed at [facility], [place] [address], [place], [place] [address]   Blood Culture (routine x 2)     Status: None (Preliminary result)   Collection Time: 01/16/20 11:13 AM   Specimen: BLOOD  Result Value Ref Range Status   Specimen Description   Final    BLOOD LEFT WRIST Performed at [facility] [address] [place], [place] [address]    Special Requests   Final    BOTTLES DRAWN AEROBIC AND ANAEROBIC Blood Culture adequate volume Performed at [facility] [address], [place], [place] [address]    Culture   Final    NO GROWTH 2 DAYS Performed at [facility] [address] [place], [place] [address]    Report Status PENDING  Incomplete  Urine culture     Status: None   Collection Time: 01/16/20 11:21 AM   Specimen: In/Out Cath Urine  Result Value Ref Range Status   Specimen Description   Final    IN/OUT CATH URINE Performed at [facility] [address], [place], [place] [address]    Special Requests   Final    NONE Performed at [facility], [place] [address], [place], [place] [address]    Culture   Final    NO GROWTH Performed at [facility], [place] [address], [place],  [address]    Report Status 01/17/2020 FINAL  Final  Blood Culture (routine x 2)     Status: None (Preliminary result)   Collection Time: 01/16/20 11:35 AM   Specimen: BLOOD  Result  Value Ref Range Status   Specimen Description   Final    BLOOD RIGHT ANTECUBITAL Performed at [facility] [address] [place], [place] [address]    Special Requests   Final    BOTTLES DRAWN AEROBIC AND ANAEROBIC Blood Culture results may not be optimal due to an excessive volume of blood received in culture bottles Performed at [facility] [address], [place], [place] [address]    Culture   Final    NO GROWTH 2 DAYS Performed at [facility] [address], [place], [place] [address]    Report Status PENDING  Incomplete  Respiratory Panel by PCR     Status: None   Collection Time: 01/16/20  4:06 PM   Specimen: Nasopharyngeal Swab; Respiratory  Result Value Ref Range Status   Adenovirus NOT DETECTED NOT DETECTED Final   Coronavirus 229E NOT DETECTED NOT DETECTED  Final    Comment: (NOTE) The Coronavirus on the Respiratory Panel, DOES NOT test for the novel  Coronavirus (2019 nCoV)    Coronavirus HKU1 NOT DETECTED NOT DETECTED Final   Coronavirus NL63 NOT DETECTED NOT DETECTED Final   Coronavirus OC43 NOT DETECTED NOT DETECTED Final   Metapneumovirus NOT DETECTED NOT DETECTED Final   Rhinovirus / Enterovirus NOT DETECTED NOT DETECTED Final   Influenza A NOT DETECTED NOT DETECTED Final   Influenza B NOT DETECTED NOT DETECTED Final   Parainfluenza Virus 1 NOT DETECTED NOT DETECTED Final   Parainfluenza Virus 2 NOT DETECTED NOT DETECTED Final   Parainfluenza Virus 3 NOT DETECTED NOT DETECTED Final   Parainfluenza Virus 4 NOT DETECTED NOT DETECTED Final   Respiratory Syncytial Virus NOT DETECTED NOT DETECTED Final   Bordetella pertussis NOT DETECTED NOT DETECTED Final   Chlamydophila pneumoniae NOT DETECTED NOT DETECTED Final   Mycoplasma pneumoniae NOT DETECTED NOT DETECTED Final    Comment: Performed at [facility], [place]. [address] [place], [place] [address]  Culture, blood (single)     Status: None (Preliminary result)   Collection Time: 01/17/20 10:41 AM   Specimen: BLOOD  Result Value Ref Range Status   Specimen Description   Final    BLOOD LEFT ANTECUBITAL Performed at [facility] [address], [place], [place] [address]    Special Requests   Final    BOTTLES DRAWN AEROBIC AND ANAEROBIC Blood Culture adequate volume Performed at [facility] [address] [place], [place] [address]    Culture   Final    NO GROWTH < 24 HOURS Performed at [facility] [address], [place],  [address]    Report Status PENDING  Incomplete    Pertinent Lab. CBC Latest Ref Rng & Units 01/18/2020 01/16/2020 01/02/2020  WBC 4.0 - 10.5 K/uL 10.6(H) 11.6(H) 11.7(H)  Hemoglobin 13.0 - 17.0 g/dL 6.3(LL) 8.1(L) 10.2(L)  Hematocrit 39 - 52 % 18.9(L) 23.7(L) 30.8(L)  Platelets 150 - 400 K/uL 294  271 307   CMP Latest Ref Rng & Units 01/18/2020 01/16/2020 01/02/2020  Glucose 70 - 99 mg/dL 96 258(H) 213(H)  BUN 8 - 23 mg/dL 14 18 28(H)  Creatinine 0.61 - 1.24 mg/dL 1.25(H) 1.38(H) 1.60(H)  Sodium 135 - 145 mmol/L 138 135 134(L)  Potassium 3.5 - 5.1 mmol/L 3.7 3.5 4.2  Chloride 98 - 111 mmol/L 105 98 101  CO2 22 - 32 mmol/L 23 24 25   Calcium 8.9 - 10.3 mg/dL 8.3(L) 9.4 9.9  Total  Protein 6.5 - 8.1 g/dL 5.4(L) 6.7 7.2  Total Bilirubin 0.3 - 1.2 mg/dL 0.3 0.1(L) 0.3  Alkaline Phos 38 - 126 U/L 62 88 109  AST 15 - 41 U/L 18 20 13(L)  ALT 0 - 44 U/L 18 20 17     Pertinent Imaging today Plain films and CT images have been personally visualized and interpreted; radiology reports have been reviewed. Decision making incorporated into the Impression / Recommendations.  [place] chest 01/17/20 FINDINGS: Targeted sonographic evaluation in the area of clinical concern in the right chest, area around the port through the nipple were scanned. No evidence of fluid collection adjacent to the port or catheter. No significant skin thickening or edema.  IMPRESSION: No evidence of fluid collection in the right chest wall related to chest port.   Chest Xray 01/17/20 FINDINGS: Stable position of the injectable port.  Cardiomediastinal silhouette is normal. Mediastinal contours appear intact.  Subtle peribronchial airspace opacities with lower lobe predominance, left greater than right.  Osseous structures are without acute abnormality. Soft tissues are grossly normal.  IMPRESSION: Subtle peribronchial airspace opacities with lower lobe predominance, left greater than right may represent early or atypical pneumonia.

## 2020-01-18 NOTE — Consult Note (Signed)
Referral MD  Reason for Referral: Fevers of unclear etiology; testicular lymphoma-status post chemotherapy  Chief Complaint  Patient presents with  . Fever  . Cough  . Facial Pain  . Weakness  : I really hate being in the hospital.  HPI: Mr. Hoeg is a very nice 69 year old white male.  He is incredibly interesting to talk to.  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  He lives up in [place].  He grows tomatoes.  I think he has had the had 3000.  I am incredibly impressed by this.  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  He underwent orchiectomy.  He had no disease elsewhere.  He has received R-CHOP for 5 cycles.  His last chemotherapy I think was on September 10.  He also has been getting intrathecal methotrexate.  He began to feel weak.  He began to have high fevers.  I do think he was neutropenic.  He was admitted on the 24th.  When he came in, his white cell count was 11.6.  Hemoglobin 8.1.  Platelet count 271,000.  Today, his white cell count is 10.6.  Hemoglobin 6.3.  Platelet count 294,000.  He still having temperatures.  He has been seen by Infectious Disease.  There is concerned that the Port-A-Cath might be the reason for the temperatures.  His blood cultures are negative.  He is on Maxipime, Flagyl and vancomycin.  He has had no problems with nausea or vomiting.  He had no mouth sores.  He has had no rashes.  There has been no diarrhea.  He has been having some issues with his sinuses.  He was on some antibiotics for this.  The he was on the Z-Pak.  He really is not too happy about having to have a blood transfusion.  I told him that this will help him feel better.  I looked at his Port-A-Cath.  This looks okay.  However, he is having temperatures.  His T-max yesterday was 103.2.  There is no rashes.  He has had no headache.  Overall, his performance status is ECOG  1.     Past Medical History:  Diagnosis Date  . Arthritis   . Diabetes mellitus without complication ([place])    type 2  . Diverticulitis 15 yrs ago  . History of kidney stones    right ureteral stone and one in 2017  . Hypertension   . 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 colonscopy  15 yrs ago  . CYSTOSCOPY/URETEROSCOPY/HOLMIUM LASER/STENT PLACEMENT Right 02/16/2017   Procedure: CYSTOSCOPY/RETROGRADE/URETEROSCOPY/HOLMIUM LASER/STENT PLACEMENT;  Surgeon: Ardis Hughs, MD;  Location: [facility];  Service: Urology;  Laterality: Right;  NEEDS 60 MIN FOR PROCEDURE  . IR IMAGING GUIDED PORT INSERTION  09/19/2019  . MOUTH SURGERY  20 yrs ago  . ORCHIECTOMY Left 09/03/2019   Procedure: LEFT ORCHIECTOMY;  Surgeon: Ardis Hughs, MD;  Location: [facility];  Service: Urology;  Laterality: Left;  :   Current Facility-Administered Medications:  .  acetaminophen (TYLENOL) tablet 650 mg, 650 mg, Oral, Q6H PRN, 650 mg at 01/18/20 0540 **OR** acetaminophen (TYLENOL) suppository 650 mg, 650 mg, Rectal, Q6H PRN, Marylyn Ishihara, Tyrone A, DO .  benzonatate (TESSALON) capsule 100 mg, 100 mg, Oral, TID, Georgette Shell, MD, 100 mg at 01/17/20 2112 .  ceFEPIme (MAXIPIME) 2 g in sodium chloride 0.9 % 100 mL IVPB, 2 g, Intravenous, Q12H, Efraim Kaufmann, RPH, Last Rate: 200 mL/hr at 01/17/20 2113, 2 g at 01/17/20 2113 .  Chlorhexidine Gluconate Cloth 2 % PADS 6 each, 6 each, Topical, Daily, Kyle, Tyrone A, DO, 6 each at 01/17/20 0943 .  guaiFENesin-dextromethorphan (ROBITUSSIN DM) 100-10 MG/5ML syrup 5 mL, 5 mL, Oral, Q4H PRN, Kyle, Tyrone A, DO, 5 mL at 01/17/20 2108 .  heparin injection 5,000 Units, 5,000 Units, Subcutaneous, Q8H, Kyle, Tyrone A, DO, 5,000 Units at 01/18/20 0541 .  ibuprofen (ADVIL) tablet 200 mg, 200 mg, Oral, Q6H PRN, Georgette Shell, MD, 200 mg at 01/17/20 2125 .  insulin aspart (novoLOG) injection 0-15 Units, 0-15 Units,  Subcutaneous, TID WC, Kyle, Tyrone A, DO, 2 Units at 01/17/20 1838 .  insulin aspart (novoLOG) injection 0-5 Units, 0-5 Units, Subcutaneous, QHS, Kyle, Tyrone A, DO .  insulin glargine (LANTUS) injection 30 Units, 30 Units, Subcutaneous, Daily, Kyle, Tyrone A, DO, 30 Units at 01/17/20 1025 .  LORazepam (ATIVAN) tablet 0.5 mg, 0.5 mg, Oral, Q6H PRN, Marylyn Ishihara, Tyrone A, DO, 0.5 mg at 01/17/20 2257 .  metroNIDAZOLE (FLAGYL) IVPB 500 mg, 500 mg, Intravenous, Q8H, Blount, Xenia T, NP, Last Rate: 100 mL/hr at 01/18/20 0542, 500 mg at 01/18/20 0542 .  ondansetron (ZOFRAN) tablet 4 mg, 4 mg, Oral, Q6H PRN **OR** ondansetron (ZOFRAN) injection 4 mg, 4 mg, Intravenous, Q6H PRN, Kyle, Tyrone A, DO, 4 mg at 01/17/20 2323 .  pantoprazole (PROTONIX) EC tablet 40 mg, 40 mg, Oral, Daily, Kyle, Tyrone A, DO, 40 mg at 01/17/20 0942 .  senna-docusate (Senokot-S) tablet 2 tablet, 2 tablet, Oral, QHS PRN, Marylyn Ishihara, Tyrone A, DO, 2 tablet at 01/17/20 0906 .  sodium chloride flush (NS) 0.9 % injection 10-40 mL, 10-40 mL, Intracatheter, Q12H, Georgette Shell, MD .  sodium chloride flush (NS) 0.9 % injection 10-40 mL, 10-40 mL, Intracatheter, PRN, Georgette Shell, MD .  sodium chloride flush (NS) 0.9 % injection 3 mL, 3 mL, Intravenous, Q12H, Kyle, Tyrone A, DO, 3 mL at 01/17/20 0957 .  vancomycin (VANCOREADY) IVPB 750 mg/150 mL, 750 mg, Intravenous, Q12H, Efraim Kaufmann, RPH, Last Rate: 150 mL/hr at 01/18/20 0033, 750 mg at 01/18/20 0033:  . benzonatate  100 mg Oral TID  . Chlorhexidine Gluconate Cloth  6 each Topical Daily  . heparin  5,000 Units Subcutaneous Q8H  . insulin aspart  0-15 Units Subcutaneous TID WC  . insulin aspart  0-5 Units Subcutaneous QHS  . insulin glargine  30 Units Subcutaneous Daily  . pantoprazole  40 mg Oral Daily  . sodium chloride flush  10-40 mL Intracatheter Q12H  . sodium chloride flush  3 mL Intravenous Q12H  :  Allergies  Allergen Reactions  . Hydrocodone Nausea And Vomiting   . Percocet [Oxycodone-Acetaminophen] Nausea And Vomiting  . Codeine Itching  . Penicillins Itching    Has patient had a PCN reaction causing immediate rash, facial/tongue/throat swelling, SOB or lightheadedness with hypotension: No Has patient had a PCN reaction causing severe rash involving mucus membranes or skin necrosis: No Has patient had a PCN reaction that required hospitalization No Has patient had a PCN reaction occurring within the last 10 years: No If all of the above answers are "NO", then may proceed with Cephalosporin use.   . Tape Itching and Rash     Paper Tape causes blisters; please use Coban wrap if possible  :  History  reviewed. No pertinent family history.:  Social History   Socioeconomic History  . Marital status: Legally Separated    Spouse name: Not on file  . Number of children: Not on file  . Years of education: Not on file  . Highest education level: Not on file  Occupational History  . Not on file  Tobacco Use  . Smoking status: Never Smoker  . Smokeless tobacco: Never Used  Vaping Use  . Vaping Use: Never used  Substance and Sexual Activity  . Alcohol use: No  . Drug use: No  . Sexual activity: Not on file  Other Topics Concern  . Not on file  Social History Narrative   Pt states "Potential reaction after covid vaccination" States "Experienced left fingers numb and left inner thigh numb, left testicular sweating, all within 5 hours of vaccination".   Social Determinants of Health   Financial Resource Strain:   . Difficulty of Paying Living Expenses: Not on file  Food Insecurity:   . Worried About Charity fundraiser in the Last Year: Not on file  . Ran Out of Food in the Last Year: Not on file  Transportation Needs:   . Lack of Transportation (Medical): Not on file  . Lack of Transportation (Non-Medical): Not on file  Physical Activity:   . Days of Exercise per Week: Not on file  . Minutes of Exercise per Session: Not on file  Stress:    . Feeling of Stress : Not on file  Social Connections:   . Frequency of Communication with Friends and Family: Not on file  . Frequency of Social Gatherings with Friends and Family: Not on file  . Attends Religious Services: Not on file  . Active Member of Clubs or Organizations: Not on file  . Attends Archivist Meetings: Not on file  . Marital Status: Not on file  Intimate Partner Violence:   . Fear of Current or Ex-Partner: Not on file  . Emotionally Abused: Not on file  . Physically Abused: Not on file  . Sexually Abused: Not on file  :  Review of Systems  Constitutional: Positive for fever and malaise/fatigue.  HENT: Positive for sinus pain.   Eyes: Negative.   Respiratory: Negative.   Cardiovascular: Negative.   Gastrointestinal: Negative.   Genitourinary: Negative.   Musculoskeletal: Negative.   Skin: Negative.   Neurological: Negative.   Endo/Heme/Allergies: Negative.   Psychiatric/Behavioral: Negative.      Exam:  This is a well-developed and well-nourished white male in no obvious distress.  Vital signs show temperature of 101.  Pulse 108.  Blood pressure 116/58.  His head neck exam shows no ocular or oral lesions.  There are no palpable cervical or supraclavicular lymph nodes.  Lungs are clear bilaterally.  Cardiac exam regular rate and rhythm with no murmurs, rubs or bruits.  Abdomen is soft.  He has good bowel sounds.  There is no fluid wave.  There is no palpable liver or spleen tip.  Extremities shows no clubbing, cyanosis or edema.  There actually may be a little bit of edema.  Skin exam shows no rashes, ecchymoses or petechia.  The Port-A-Cath site does not show any obvious erythema.    Patient Vitals for the past 24 hrs:  BP Temp Temp src Pulse Resp SpO2  01/18/20 0529 (!) 116/58 (!) 101 F (38.3 C) Oral (!) 108 17 91 %  01/18/20 0126 (!) 101/43 98.7 F (37.1 C) Oral 88 15 (!) 86 %  01/18/20 0024 (!) 93/55 99 F (37.2 C) Oral 90 17 91 %   01/17/20 2321 (!) 102/55 99.8 F (37.7 C) Oral 93 17 94 %  01/17/20 2122 129/62 (!) 103.2 F (39.6 C) Oral (!) 108 (!) 21 94 %  01/17/20 1733 (!) 104/55 98.2 F (36.8 C) Oral 85 18 98 %  01/17/20 1246 (!) 99/55 98.5 F (36.9 C) Oral 81 20 95 %  01/17/20 0944 (!) 99/46 (!) 103 F (39.4 C) Oral (!) 109 (!) 21 94 %     Recent Labs    01/16/20 1108 01/18/20 0341  WBC 11.6* 10.6*  HGB 8.1* 6.3*  HCT 23.7* 18.9*  PLT 271 294   Recent Labs    01/16/20 1108 01/18/20 0341  NA 135 138  K 3.5 3.7  CL 98 105  CO2 24 23  GLUCOSE 258* 96  BUN 18 14  CREATININE 1.38* 1.25*  CALCIUM 9.4 8.3*    Blood smear review: None  Pathology: None    Assessment and Plan: Mr. Haydon is a 69 year old white male.  He has a testicular lymphoma.  He has been treated with aggressive chemotherapy with R-CHOP and intrathecal methotrexate.  He has had 5 cycles of chemotherapy to date.  I think he has had 2 intrathecal methotrexate cycles.  He now has these fevers.  We do have to worry about the Port-A-Cath.  I realize that his cultures are negative.  He still having temperatures despite the antibiotics.  He says he is not going to take any more chemotherapy.  He has had 5 out of 6 cycles.  There is no disease noted outside of the left testicle.  Is going to get testicular radiation.  I probably would consider getting the Port-A-Cath out.  Again, he does not wish to have any further chemotherapy.  Despite antibiotics he still is having temperatures.  I know this is a difficult "call" but I think it makes sense to remove the Port-A-Cath.  He is not neutropenic.  This is a blessing.  I think this would clearly complicate matters.  We will see if interventional radiology can remove the Port-A-Cath today.  He is can have a blood transfusion.  This is reasonable.  I understand this.  I will let Dr. Irene Limbo know of his admission.  I am sure he will follow-up tomorrow with Mr. Burmester.  I know Mr. Corella  is getting outstanding care from all staff up on 4 E.  He does have a strong faith.  We will stay in prayer for him.  Lattie Haw, MD  Alison Murray 8:12

## 2020-01-18 NOTE — Progress Notes (Signed)
0345- Hgb 6.3, hospitalist on call informed, with orders to transfuse 2 units PRBC after typing and crossmatching.

## 2020-01-19 ENCOUNTER — Inpatient Hospital Stay (HOSPITAL_COMMUNITY): Payer: Medicare Other

## 2020-01-19 DIAGNOSIS — A021 Salmonella sepsis: Secondary | ICD-10-CM

## 2020-01-19 DIAGNOSIS — J189 Pneumonia, unspecified organism: Secondary | ICD-10-CM

## 2020-01-19 DIAGNOSIS — G7281 Critical illness myopathy: Secondary | ICD-10-CM

## 2020-01-19 DIAGNOSIS — R6520 Severe sepsis without septic shock: Secondary | ICD-10-CM

## 2020-01-19 DIAGNOSIS — A419 Sepsis, unspecified organism: Secondary | ICD-10-CM | POA: Diagnosis not present

## 2020-01-19 HISTORY — PX: IR REMOVAL TUN ACCESS W/ PORT W/O FL MOD SED: IMG2290

## 2020-01-19 LAB — CBC WITH DIFFERENTIAL/PLATELET
Abs Immature Granulocytes: 0.22 10*3/uL — ABNORMAL HIGH (ref 0.00–0.07)
Basophils Absolute: 0.1 10*3/uL (ref 0.0–0.1)
Basophils Relative: 0 %
Eosinophils Absolute: 0.1 10*3/uL (ref 0.0–0.5)
Eosinophils Relative: 1 %
HCT: 25.5 % — ABNORMAL LOW (ref 39.0–52.0)
Hemoglobin: 8.4 g/dL — ABNORMAL LOW (ref 13.0–17.0)
Immature Granulocytes: 2 %
Lymphocytes Relative: 6 %
Lymphs Abs: 0.7 10*3/uL (ref 0.7–4.0)
MCH: 29.9 pg (ref 26.0–34.0)
MCHC: 32.9 g/dL (ref 30.0–36.0)
MCV: 90.7 fL (ref 80.0–100.0)
Monocytes Absolute: 0.9 10*3/uL (ref 0.1–1.0)
Monocytes Relative: 8 %
Neutro Abs: 9.3 10*3/uL — ABNORMAL HIGH (ref 1.7–7.7)
Neutrophils Relative %: 83 %
Platelets: 335 10*3/uL (ref 150–400)
RBC: 2.81 MIL/uL — ABNORMAL LOW (ref 4.22–5.81)
RDW: 16.8 % — ABNORMAL HIGH (ref 11.5–15.5)
WBC: 11.3 10*3/uL — ABNORMAL HIGH (ref 4.0–10.5)
nRBC: 0.5 % — ABNORMAL HIGH (ref 0.0–0.2)

## 2020-01-19 LAB — COMPREHENSIVE METABOLIC PANEL
ALT: 17 U/L (ref 0–44)
AST: 23 U/L (ref 15–41)
Albumin: 2.6 g/dL — ABNORMAL LOW (ref 3.5–5.0)
Alkaline Phosphatase: 58 U/L (ref 38–126)
Anion gap: 7 (ref 5–15)
BUN: 13 mg/dL (ref 8–23)
CO2: 24 mmol/L (ref 22–32)
Calcium: 8.5 mg/dL — ABNORMAL LOW (ref 8.9–10.3)
Chloride: 107 mmol/L (ref 98–111)
Creatinine, Ser: 1.2 mg/dL (ref 0.61–1.24)
GFR calc Af Amer: >60 mL/min (ref >60)
GFR calc non Af Amer: >60 mL/min (ref >60)
Glucose, Bld: 93 mg/dL (ref 70–99)
Potassium: 3.9 mmol/L (ref 3.5–5.1)
Sodium: 138 mmol/L (ref 135–145)
Total Bilirubin: 0.4 mg/dL (ref 0.3–1.2)
Total Protein: 5.6 g/dL — ABNORMAL LOW (ref 6.5–8.1)

## 2020-01-19 LAB — TYPE AND SCREEN
ABO/RH(D): O POS
Antibody Screen: NEGATIVE
Unit division: 0
Unit division: 0

## 2020-01-19 LAB — CBC
HCT: 25.5 % — ABNORMAL LOW (ref 39.0–52.0)
Hemoglobin: 8.7 g/dL — ABNORMAL LOW (ref 13.0–17.0)
MCH: 31.2 pg (ref 26.0–34.0)
MCHC: 34.1 g/dL (ref 30.0–36.0)
MCV: 91.4 fL (ref 80.0–100.0)
Platelets: 349 10*3/uL (ref 150–400)
RBC: 2.79 MIL/uL — ABNORMAL LOW (ref 4.22–5.81)
RDW: 17 % — ABNORMAL HIGH (ref 11.5–15.5)
WBC: 9.3 10*3/uL (ref 4.0–10.5)
nRBC: 0.4 % — ABNORMAL HIGH (ref 0.0–0.2)

## 2020-01-19 LAB — GLUCOSE, CAPILLARY
Glucose-Capillary: 63 mg/dL — ABNORMAL LOW (ref 70–99)
Glucose-Capillary: 83 mg/dL (ref 70–99)
Glucose-Capillary: 205 mg/dL — ABNORMAL HIGH (ref 70–99)
Glucose-Capillary: 147 mg/dL — ABNORMAL HIGH (ref 70–99)
Glucose-Capillary: 124 mg/dL — ABNORMAL HIGH (ref 70–99)
Glucose-Capillary: 126 mg/dL — ABNORMAL HIGH (ref 70–99)
Glucose-Capillary: 86 mg/dL (ref 70–99)
Glucose-Capillary: 153 mg/dL — ABNORMAL HIGH (ref 70–99)
Glucose-Capillary: 126 mg/dL — ABNORMAL HIGH (ref 70–99)
Glucose-Capillary: 113 mg/dL — ABNORMAL HIGH (ref 70–99)
Glucose-Capillary: 85 mg/dL (ref 70–99)
Glucose-Capillary: 95 mg/dL (ref 70–99)
Glucose-Capillary: 80 mg/dL (ref 70–99)

## 2020-01-19 LAB — BPAM RBC
Blood Product Expiration Date: 202110262359
Blood Product Expiration Date: 202110262359
ISSUE DATE / TIME: 202109261047
ISSUE DATE / TIME: 202109261437
Unit Type and Rh: 5100
Unit Type and Rh: 5100

## 2020-01-19 LAB — MRSA PCR SCREENING: MRSA by PCR: NEGATIVE

## 2020-01-19 LAB — OCCULT BLOOD X 1 CARD TO LAB, STOOL: Fecal Occult Bld: NEGATIVE

## 2020-01-19 MED ORDER — DEXTROSE-NACL 5-0.9 % IV SOLN: INTRAVENOUS | Status: DC

## 2020-01-19 MED ORDER — FLUMAZENIL 0.5 MG/5ML IV SOLN
INTRAVENOUS | Status: AC
  Filled 2020-01-19: qty 5

## 2020-01-19 MED ORDER — VANCOMYCIN HCL IN DEXTROSE 1-5 GM/200ML-% IV SOLN
INTRAVENOUS | Status: AC | PRN
  Administered 2020-01-19: 1000 mg via INTRAVENOUS

## 2020-01-19 MED ORDER — MIDAZOLAM HCL 2 MG/2ML IJ SOLN
INTRAMUSCULAR | Status: AC
  Filled 2020-01-19: qty 4

## 2020-01-19 MED ORDER — NALOXONE HCL 0.4 MG/ML IJ SOLN
INTRAMUSCULAR | Status: AC
  Filled 2020-01-19: qty 1

## 2020-01-19 MED ORDER — PHENOL 1.4 % MT LIQD
1.0000 | OROMUCOSAL | Status: DC | PRN
  Administered 2020-01-19: 1 via OROMUCOSAL

## 2020-01-19 MED ORDER — LIDOCAINE HCL 1 % IJ SOLN
INTRAMUSCULAR | Status: AC
  Filled 2020-01-19: qty 20

## 2020-01-19 MED ORDER — VANCOMYCIN HCL IN DEXTROSE 1-5 GM/200ML-% IV SOLN
INTRAVENOUS | Status: AC
  Filled 2020-01-19: qty 200

## 2020-01-19 MED ORDER — LIDOCAINE HCL (PF) 1 % IJ SOLN
INTRAMUSCULAR | Status: AC | PRN
  Administered 2020-01-19: 10 mL via INTRADERMAL

## 2020-01-19 MED ORDER — FENTANYL CITRATE (PF) 100 MCG/2ML IJ SOLN
INTRAMUSCULAR | Status: AC | PRN
Start: 2020-01-19 — End: 2020-01-19
  Administered 2020-01-19 (×2): 50 ug via INTRAVENOUS

## 2020-01-19 MED ORDER — MIDAZOLAM HCL 2 MG/2ML IJ SOLN
INTRAMUSCULAR | Status: AC | PRN
  Administered 2020-01-19 (×2): 2 mg via INTRAVENOUS

## 2020-01-19 MED ORDER — SODIUM CHLORIDE 0.9 % IV SOLN
2.0000 g | Freq: Three times a day (TID) | INTRAVENOUS | Status: DC
  Administered 2020-01-19 – 2020-01-20 (×3): 2 g via INTRAVENOUS
  Filled 2020-01-19 (×3): qty 2

## 2020-01-19 MED ORDER — VANCOMYCIN HCL IN DEXTROSE 1-5 GM/200ML-% IV SOLN: 1000.0000 mg | Freq: Once | INTRAVENOUS | Status: AC

## 2020-01-19 MED ORDER — FENTANYL CITRATE (PF) 100 MCG/2ML IJ SOLN
INTRAMUSCULAR | Status: AC
Start: 2020-01-19 — End: 2020-01-20
  Filled 2020-01-19: qty 2

## 2020-01-19 NOTE — Progress Notes (Signed)
   01/19/20 0020  Assess: MEWS Score  Temp (!) 101.9 F (38.8 C)  BP 112/62  Pulse Rate 88  ECG Heart Rate 92  Resp 20  Level of Consciousness Alert  SpO2 92 %  O2 Device Room Air  Assess: MEWS Score  MEWS Temp 2  MEWS Systolic 0  MEWS Pulse 0  MEWS RR 0  MEWS LOC 0  MEWS Score 2  MEWS Score Color Yellow  Assess: if the MEWS score is Yellow or Red  Were vital signs taken at a resting state? Yes  Focused Assessment No change from prior assessment  Early Detection of Sepsis Score *See Row Information* Low  MEWS guidelines implemented *See Row Information* Yes  Treat  MEWS Interventions Administered prn meds/treatments  Pain Scale 0-10  Take Vital Signs  Increase Vital Sign Frequency  Yellow: Q 2hr X 2 then Q 4hr X 2, if remains yellow, continue Q 4hrs  Escalate  MEWS: Escalate Yellow: discuss with charge nurse/RN and consider discussing with provider and RRT  Notify: Charge Nurse/RN  Name of Charge Nurse/RN Notified Duffy Rhody RN  Date Charge Nurse/RN Notified 01/19/20  Time Charge Nurse/RN Notified 0150  Document  Patient Outcome Other (Comment) (continue monitoring)

## 2020-01-19 NOTE — Progress Notes (Signed)
VAST to bedside to cap port. Pt has already had port removed. Dressing in place to right chest dry and intact.

## 2020-01-19 NOTE — Progress Notes (Addendum)
HEMATOLOGY-ONCOLOGY PROGRESS NOTE  SUBJECTIVE: Continues to feel weak.  Still having intermittent fevers with a temp up to 101.9 around midnight.  Remains on cefepime and vancomycin. blood cultures obtained on admission with no growth x3 days.  Repeat blood cultures obtained on 9/25 with no growth to date.  Urine culture with no growth.  For removal of Port-A-Cath by IR later today.  Infectious disease is following the patient.  The patient is frustrated that it took so long to treat his acute illness.  Requesting to change care to another medical oncologist.  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 Initial Diagnosis   Diffuse large B cell lymphoma ([place])   10/10/2019 -  Chemotherapy   The patient had DOXOrubicin (ADRIAMYCIN) chemo injection 112 mg, 50 mg/m2 = 112 mg, Intravenous,  Once, 5 of 6 cycles Administration: 112 mg (10/10/2019), 112 mg (11/01/2019), 112 mg (11/21/2019), 112 mg (12/12/2019), 112 mg (01/02/2020) palonosetron (ALOXI) injection 0.25 mg, 0.25 mg, Intravenous,  Once, 5 of 6 cycles Administration: 0.25 mg (10/10/2019), 0.25 mg (11/01/2019), 0.25 mg (11/21/2019), 0.25 mg (12/12/2019), 0.25 mg (01/02/2020) pegfilgrastim-cbqv (UDENYCA) injection 6 mg, 6 mg, Subcutaneous, Once, 5 of 6 cycles Administration: 6 mg (10/11/2019), 6 mg (11/03/2019), 6 mg (11/22/2019), 6 mg (12/15/2019), 6 mg (01/05/2020) vinCRIStine (ONCOVIN) 2 mg in sodium chloride 0.9 % 50 mL chemo infusion, 2 mg (100 % of original dose 2 mg), Intravenous,  Once, 5 of 6 cycles Dose modification: 2 mg (original dose 2 mg, Cycle 1, Reason: Provider Judgment) Administration: 2 mg (10/10/2019), 2 mg (11/01/2019), 2 mg (11/21/2019), 2 mg (12/12/2019), 2 mg (01/02/2020) methotrexate (PF) 12 mg, hydrocortisone sodium succinate (SOLU-CORTEF) 50 mg in sodium chloride (PF) 0.9 % INTRATHECAL chemo injection, , Intrathecal,  Once, 3 of 4 cycles Administration:  (12/08/2019),  (12/26/2019) cyclophosphamide (CYTOXAN) 1,120 mg in  sodium chloride 0.9 % 250 mL chemo infusion, 500 mg/m2 = 1,120 mg (66.7 % of original dose 750 mg/m2), Intravenous,  Once, 5 of 6 cycles Dose modification: 500 mg/m2 (original dose 750 mg/m2, Cycle 1, Reason: Provider Judgment) Administration: 1,120 mg (10/10/2019), 1,120 mg (11/01/2019), 1,120 mg (11/21/2019), 1,120 mg (12/12/2019), 1,120 mg (01/02/2020) fosaprepitant (EMEND) 150 mg in sodium chloride 0.9 % 145 mL IVPB, 150 mg, Intravenous,  Once, 5 of 6 cycles Administration: 150 mg (10/10/2019), 150 mg (11/01/2019), 150 mg (11/21/2019), 150 mg (12/12/2019), 150 mg (01/02/2020) ondansetron (ZOFRAN) 8 mg, dexamethasone (DECADRON) 4 mg in sodium chloride 0.9 % 50 mL IVPB, , Intravenous,  Once, 3 of 4 cycles riTUXimab-pvvr (RUXIENCE) 800 mg in sodium chloride 0.9 % 250 mL (2.4242 mg/mL) infusion, 375 mg/m2 = 800 mg, Intravenous,  Once, 5 of 6 cycles Administration: 800 mg (10/10/2019), 800 mg (11/01/2019), 800 mg (11/21/2019), 800 mg (12/12/2019), 800 mg (01/02/2020)  for chemotherapy treatment.       REVIEW OF SYSTEMS:   Constitutional: Still with generalized fatigue and intermittent Eyes: Denies blurriness of vision Ears, nose, mouth, throat, and face: Denies mucositis or sore throat Respiratory: Reports coughing Cardiovascular: Denies palpitation, chest discomfort Gastrointestinal:  Denies nausea, heartburn or change in bowel habits Skin: Denies abnormal skin rashes Lymphatics: Denies new lymphadenopathy or easy bruising Neurological:Denies numbness, tingling or new weaknesses Behavioral/Psych: Mood is stable, no new changes  Extremities: No lower extremity edema All other systems were reviewed with the patient and are negative.  I have reviewed the past medical history, past surgical history, social history and family history with the patient and they are unchanged from previous  note.   PHYSICAL EXAMINATION: ECOG PERFORMANCE STATUS: 1 - Symptomatic but completely ambulatory  Vitals:   01/19/20  1435 01/19/20 1448  BP: 110/60 109/65  Pulse: 84   Resp: 16   Temp:    SpO2: 98%    Filed Weights   01/16/20 1044 01/16/20 1351  Weight: 93.2 kg 93.2 kg    Intake/Output from previous day: 09/26 0701 - 09/27 0700 In: 2509.8 [P.O.:1170; Blood:739.8; IV Piggyback:600] Out: 1326 [Urine:1325; Stool:1]  GENERAL:alert, no distress and comfortable SKIN: skin color, texture, turgor are normal, no rashes or significant lesions EYES: normal, Conjunctiva are pink and non-injected, sclera clear OROPHARYNX:no exudate, no erythema and lips, buccal mucosa, and tongue normal  LUNGS: clear to auscultation and percussion with normal breathing effort HEART: regular rate & rhythm and no murmurs and no lower extremity edema ABDOMEN:abdomen soft, non-tender and normal bowel sounds NEURO: alert & oriented x 3 with fluent speech, no focal motor/sensory deficits  Port-A-Cath without erythema or edema  LABORATORY DATA:  I have reviewed the data as listed CMP Latest Ref Rng & Units 01/19/2020 01/18/2020 01/16/2020  Glucose 70 - 99 mg/dL 93 96 258(H)  BUN 8 - 23 mg/dL 13 14 18   Creatinine 0.61 - 1.24 mg/dL 1.20 1.25(H) 1.38(H)  Sodium 135 - 145 mmol/L 138 138 135  Potassium 3.5 - 5.1 mmol/L 3.9 3.7 3.5  Chloride 98 - 111 mmol/L 107 105 98  CO2 22 - 32 mmol/L 24 23 24   Calcium 8.9 - 10.3 mg/dL 8.5(L) 8.3(L) 9.4  Total Protein 6.5 - 8.1 g/dL 5.6(L) 5.4(L) 6.7  Total Bilirubin 0.3 - 1.2 mg/dL 0.4 0.3 0.1(L)  Alkaline Phos 38 - 126 U/L 58 62 88  AST 15 - 41 U/L 23 18 20   ALT 0 - 44 U/L 17 18 20     Lab Results  Component Value Date   WBC 9.3 01/19/2020   HGB 8.7 (L) 01/19/2020   HCT 25.5 (L) 01/19/2020   MCV 91.4 01/19/2020   PLT 349 01/19/2020   NEUTROABS 9.3 (H) 01/18/2020    DG Chest 2 View  Result Date: 01/16/2020 CLINICAL DATA:  Lymphoma with cough and recurrent fever EXAM: CHEST - 2 VIEW COMPARISON:  Chest CT 08/26/2019 FINDINGS: Normal heart size and mediastinal contours. Porta catheter  on the right with tip at the SVC. There is no edema, consolidation, effusion, or pneumothorax. Spondylosis. IMPRESSION: No evidence of acute disease. Electronically Signed   By: Monte Fantasia M.D.   On: 01/16/2020 10:17   DG CHEST PORT 1 VIEW  Result Date: 01/17/2020 CLINICAL DATA:  Shortness of breath. EXAM: PORTABLE CHEST 1 VIEW COMPARISON:  January 16, 2020 FINDINGS: Stable position of the injectable port. Cardiomediastinal silhouette is normal. Mediastinal contours appear intact. Subtle peribronchial airspace opacities with lower lobe predominance, left greater than right. Osseous structures are without acute abnormality. Soft tissues are grossly normal. IMPRESSION: Subtle peribronchial airspace opacities with lower lobe predominance, left greater than right may represent early or atypical pneumonia. Electronically Signed   By: Fidela Salisbury M.D.   On: 01/17/2020 20:12   DG Fluoro Guide Spinal/SI Jt Inj Left  Result Date: 12/26/2019 Etheleen Mayhew, MD     12/26/2019  2:56 PM Lumbar puncture performed at L3-L4 for intrathecal chemotherapy infusion.  12 mg methotrexate administered intrathecally.  No complications.  Please see dictation in PACS for additional details.   [place] CHEST SOFT TISSUE  Result Date: 01/17/2020 CLINICAL DATA:  Infection due to Port-A-Cath. EXAM: ULTRASOUND OF HEAD/NECK SOFT  TISSUES TECHNIQUE: Ultrasound examination of the head and neck soft tissues was performed in the area of clinical concern. COMPARISON:  None. FINDINGS: Targeted sonographic evaluation in the area of clinical concern in the right chest, area around the port through the nipple were scanned. No evidence of fluid collection adjacent to the port or catheter. No significant skin thickening or edema. IMPRESSION: No evidence of fluid collection in the right chest wall related to chest port. Electronically Signed   By: Keith Rake M.D.   On: 01/17/2020 18:21   CT Maxillofacial Wo Contrast  Result  Date: 01/16/2020 CLINICAL DATA:  Syncope with nasal trauma. Sinus pressure, cough. History of B-cell lymphoma EXAM: CT MAXILLOFACIAL WITHOUT CONTRAST TECHNIQUE: Multidetector CT imaging of the maxillofacial structures was performed. Multiplanar CT image reconstructions were also generated. COMPARISON:  None. FINDINGS: Osseous: No acute maxillofacial bone fracture. Bony orbital walls intact. Mandible intact. Temporomandibular joints aligned without dislocation. Orbits: Negative. No traumatic or inflammatory finding. Sinuses: Paranasal sinuses are well aerated and clear. No mucosal thickening or air-fluid level. Pneumatization of the bilateral middle nasal turbinates. Bilateral mastoid air cells are clear. Soft tissues: No soft tissue swelling. No masses identified. No lacrimal gland enlargement. Limited intracranial: No significant or unexpected finding. IMPRESSION: 1. Negative for acute maxillofacial bone fracture. 2. Paranasal sinuses are well aerated and clear. Electronically Signed   By: Davina Poke D.O.   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 EXAM: DIAGNOSTIC LUMBAR PUNCTURE UNDER FLUOROSCOPIC GUIDANCE INTRATHECAL CHEMOTHERAPY INJECTION FLUOROSCOPY TIME:  Fluoroscopy Time:  1 minutes and 24 seconds Radiation Exposure Index (if provided by the fluoroscopic device): 13.6 mGy PROCEDURE: Informed consent was obtained from the patient prior to the procedure, including potential complications of headache, allergy, and pain. With the patient prone, the lower back was prepped with Betadine. 1% Lidocaine was used for local anesthesia. Lumbar puncture was performed at the L3-L4 level using a 20 gauge needle with return of clear CSF. No CSF was obtained for laboratory studies. 12 mg of methotrexate was injected intrathecally. The patient tolerated the procedure  well and there were no apparent complications. IMPRESSION: 1. Successful uncomplicated fluoroscopic guided lumbar puncture for intrathecal chemotherapy injection, as above. Electronically Signed   By: Vinnie Langton M.D.   On: 12/26/2019 14:43    ASSESSMENT AND PLAN: 1.  Left testicular large B-cell lymphoma 2.  Sepsis -possibly due to early pneumonia noted on chest x-ray 9/25 3.  Anemia 4.  Hypertension 5.  Diabetes  -The patient has completed 5 of 6 planned cycles of chemotherapy.  The patient has indicated that he is not interested in any additional chemotherapy.  The patient would like to switch his medical oncology care to Dr. Marin Olp.  I have spoken with Dr. Marin Olp who has agreed to take over his care.  Further recommendations regarding any additional treatment per Dr. Marin Olp. -Patient has sepsis of unclear etiology.  Port-A-Cath to be removed today.  He does have early pneumonia noted on chest x-ray.  Blood cultures and urine culture remain negative.  Infectious disease is following closely.  Continue IV antibiotics per hospitalist and infectious disease. -The patient has received 2 units PRBCs this admission.  Hemoglobin is stable today at 8.7.  Monitor closely and transfuse for hemoglobin less than 8. -Management of chronic medical conditions per hospitalist.   LOS: 3 days  Mikey Bussing, DNP, AGPCNP-BC, AOCNP 01/19/20   ADDENDUM: Mr. Heber feels a lot better now that the Port-A-Cath is out.  He says that his breathing is better.  He does not have any type of congestion.  I am still not sure how this would be related to the Port-A-Cath removal.  He is still having spiking temperatures.  All of his cultures have been negative.  It will be interesting to see what the catheter tip culture is out, if anything.  He had 2 units of blood yesterday.  I think this is helped.  His white cell count is 9.3.  Hemoglobin 8.7.  Platelet count 349,000.  His BUN is 13 creatinine 1.2.  Liver  functions tests are normal.  Hopefully, once his fever is normalized, he can be put off antibiotics.  I would then watch him for a day.    He had a chest x-ray on 01/17/2020.  This shows some subtle lower lobe airspace opacities.  I think an incentive spirometer would help him out quite a bit.  Hopefully, he will be able to move around little bit more.  I would like to think that he should be able to get out of the hospital in a couple days.  Again this is going to depend on his temperature curve.  I know that he is getting outstanding care from all the staff on [address].  His girlfriend was with him today.  I had a nice chat with her.   Lattie Haw, MD  Darlyn Chamber 29:11

## 2020-01-19 NOTE — Progress Notes (Signed)
[facility]  Date of Admission:  01/16/2020   Total days of antibiotics: 4         Current antibiotics:         Day 4 vancomycin        Day 4 cefepime  Reason for visit: Follow up on fevers  Interval History: Febrile overnight 101.9 Fahrenheit Cultures negative No new imaging this morning Planning for port removal today  Assessment: Patrick Bautista is a 69 y.o. male. His fever curve has been trending down with broad spectrum antimicrobial coverage. He subjectively feels better. His leukocytosis is better. His cultures remain negative. He does continue to endorse mild respiratory symptoms with September 25 chest x-ray concerning for early pneumonia. Oxygen saturation is adequate. He is planning to go for removal of his port sometime today.  Recommendations:  --Continue vancomycin and cefepime for pneumonia coverage --Agree with removal of Port-A-Cath with no plans for further chemo per patient --Check MRSA nares PCR. If negative and cultures remain negative will DC vancomycin --Follow fever curve --Lab monitoring while on IV antibiotics  Principal Problem:   Sepsis ([place]) Active Problems:   Diffuse large B cell lymphoma ([place])   Port-A-Cath in place   Pneumonia    Scheduled Meds: . benzonatate  100 mg Oral TID  . Chlorhexidine Gluconate Cloth  6 each Topical Daily  . heparin  5,000 Units Subcutaneous Q8H  . insulin aspart  0-15 Units Subcutaneous TID WC  . insulin aspart  0-5 Units Subcutaneous QHS  . insulin glargine  30 Units Subcutaneous Daily  . pantoprazole  40 mg Oral Daily  . sodium chloride flush  10-40 mL Intracatheter Q12H  . sodium chloride flush  3 mL Intravenous Q12H   Continuous Infusions: . ceFEPime (MAXIPIME) IV 2 g (01/19/20 0931)  . vancomycin 750 mg (01/19/20 0020)   PRN Meds:.acetaminophen **OR** acetaminophen, guaiFENesin-dextromethorphan, ibuprofen, LORazepam, ondansetron **OR** ondansetron (ZOFRAN) IV, phenol,  senna-docusate, sodium chloride flush  Subjective: Patient reports feeling better this morning. He thinks his fevers are less frequent. He reports mild cough with phlegm, however, no significant shortness of breath. No new rashes. Tolerating ABX.  Allergies  Allergen Reactions  . Hydrocodone Nausea And Vomiting  . Percocet [Oxycodone-Acetaminophen] Nausea And Vomiting  . Codeine Itching  . Penicillins Itching    Has patient had a PCN reaction causing immediate rash, facial/tongue/throat swelling, SOB or lightheadedness with hypotension: No Has patient had a PCN reaction causing severe rash involving mucus membranes or skin necrosis: No Has patient had a PCN reaction that required hospitalization No Has patient had a PCN reaction occurring within the last 10 years: No If all of the above answers are "NO", then may proceed with Cephalosporin use.   . Tape Itching and Rash     Paper Tape causes blisters; please use Coban wrap if possible     Review of Systems: Review of Systems  Constitutional: Positive for fever.  HENT: Negative for sinus pain and sore throat.   Eyes: Negative.   Respiratory: Positive for cough and sputum production. Negative for shortness of breath and wheezing.   Cardiovascular: Negative for chest pain.  Musculoskeletal: Negative.   Skin: Negative for rash.  All other systems reviewed and are negative.    Objective: Blood pressure 124/65, pulse 76, temperature 99.7 F (37.6 C), temperature source Oral, resp. rate 18, height 5\' 10"  (1.778 m), weight 93.2 kg, SpO2 90 %. Body mass index is 29.47  kg/m.  Physical Exam Constitutional:      General: He is not in acute distress.    Appearance: Normal appearance.  HENT:     Head: Normocephalic and atraumatic.     Nose: Nose normal. No congestion.     Mouth/Throat:     Mouth: Mucous membranes are moist.     Pharynx: Oropharynx is clear.  Eyes:     General: No scleral icterus.    Extraocular Movements:  Extraocular movements intact.     Conjunctiva/sclera: Conjunctivae normal.  Cardiovascular:     Comments: Right sided port in place.  No warmth or erythema. Pulmonary:     Effort: Pulmonary effort is normal. No respiratory distress.     Breath sounds: Normal breath sounds. No wheezing.  Abdominal:     General: Abdomen is flat. There is no distension.     Palpations: Abdomen is soft.     Tenderness: There is no abdominal tenderness.  Musculoskeletal:        General: Normal range of motion.  Skin:    General: Skin is warm and dry.  Neurological:     General: No focal deficit present.     Mental Status: He is alert and oriented to person, place, and time.  Psychiatric:        Mood and Affect: Mood normal.        Behavior: Behavior normal.       Lab Results: Lab Results  Component Value Date   WBC 9.3 01/19/2020   HGB 8.7 (L) 01/19/2020   HCT 25.5 (L) 01/19/2020   MCV 91.4 01/19/2020   PLT 349 01/19/2020    Lab Results  Component Value Date   CREATININE 1.20 01/19/2020   BUN 13 01/19/2020   NA 138 01/19/2020   K 3.9 01/19/2020   CL 107 01/19/2020   CO2 24 01/19/2020    Lab Results  Component Value Date   ALT 17 01/19/2020   AST 23 01/19/2020   ALKPHOS 58 01/19/2020     Microbiology: Recent Results (from the past 240 hour(s))  Respiratory Panel by RT PCR (Flu A&B, Covid) - Nasopharyngeal Swab     Status: None   Collection Time: 01/16/20 11:09 AM   Specimen: Nasopharyngeal Swab  Result Value Ref Range Status   SARS Coronavirus 2 by RT PCR NEGATIVE NEGATIVE Final    Comment: (NOTE) SARS-CoV-2 target nucleic acids are NOT DETECTED.  The SARS-CoV-2 RNA is generally detectable in upper respiratoy specimens during the acute phase of infection. The lowest concentration of SARS-CoV-2 viral copies this assay can detect is 131 copies/mL. A negative result does not preclude SARS-Cov-2 infection and should not be used as the sole basis for treatment or other patient  management decisions. A negative result may occur with  improper specimen collection/handling, submission of specimen other than nasopharyngeal swab, presence of viral mutation(s) within the areas targeted by this assay, and inadequate number of viral copies (<131 copies/mL). A negative result must be combined with clinical observations, patient history, and epidemiological information. The expected result is Negative.  Fact Sheet for Patients:  PinkCheek.be  Fact Sheet for Healthcare Providers:  GravelBags.it  This test is no t yet approved or cleared by the [place] FDA and  has been authorized for detection and/or diagnosis of SARS-CoV-2 by FDA under an Emergency Use Authorization (EUA). This EUA will remain  in effect (meaning this test can be used) for the duration of the COVID-19 declaration under Section 564(b)(1) of the Act,  21 U.S.C. section 360bbb-3(b)(1), unless the authorization is terminated or revoked sooner.     Influenza A by PCR NEGATIVE NEGATIVE Final   Influenza B by PCR NEGATIVE NEGATIVE Final    Comment: (NOTE) The Xpert Xpress SARS-CoV-2/FLU/RSV assay is intended as an aid in  the diagnosis of influenza from Nasopharyngeal swab specimens and  should not be used as a sole basis for treatment. Nasal washings and  aspirates are unacceptable for Xpert Xpress SARS-CoV-2/FLU/RSV  testing.  Fact Sheet for Patients: PinkCheek.be  Fact Sheet for Healthcare Providers: GravelBags.it  This test is not yet approved or cleared by the [place] FDA and  has been authorized for detection and/or diagnosis of SARS-CoV-2 by  FDA under an Emergency Use Authorization (EUA). This EUA will remain  in effect (meaning this test can be used) for the duration of the  Covid-19 declaration under Section 564(b)(1) of the Act, 21  U.S.C. section 360bbb-3(b)(1),  unless the authorization is  terminated or revoked. Performed at [facility], [place] [address], [place], [place] [address]   Blood Culture (routine x 2)     Status: None (Preliminary result)   Collection Time: 01/16/20 11:13 AM   Specimen: BLOOD  Result Value Ref Range Status   Specimen Description   Final    BLOOD LEFT WRIST Performed at [facility] [address] [place], [place] [address]    Special Requests   Final    BOTTLES DRAWN AEROBIC AND ANAEROBIC Blood Culture adequate volume Performed at [facility] [address] [place], [place] [address]    Culture   Final    NO GROWTH 3 DAYS Performed at [facility], [place] [address] [place], [place] [address]    Report Status PENDING  Incomplete  Urine culture     Status: None   Collection Time: 01/16/20 11:21 AM   Specimen: In/Out Cath Urine  Result Value Ref Range Status   Specimen Description   Final    IN/OUT CATH URINE Performed at [facility] [address] [place], [place] [address]    Special Requests   Final    NONE Performed at [facility], [place] [address], [place], [place] [address]    Culture   Final    NO GROWTH Performed at [facility], [place] [address], [place], [place] [address]    Report Status 01/17/2020 FINAL  Final  Blood Culture (routine x 2)     Status: None (Preliminary result)   Collection Time: 01/16/20 11:35 AM   Specimen: BLOOD  Result Value Ref Range Status   Specimen Description   Final    BLOOD RIGHT ANTECUBITAL Performed at [facility] [address], [place], [place] [address]    Special Requests   Final    BOTTLES DRAWN AEROBIC AND ANAEROBIC Blood Culture results may not be optimal due to an excessive volume of blood received in culture bottles Performed at [facility] [address], [place], [place] [address]    Culture   Final     NO GROWTH 3 DAYS Performed at [facility], [place] [address] [place], [place] [address]    Report Status PENDING  Incomplete  Respiratory Panel by PCR     Status: None   Collection Time: 01/16/20  4:06 PM   Specimen: Nasopharyngeal Swab; Respiratory  Result Value Ref Range Status   Adenovirus NOT DETECTED NOT DETECTED  Final   Coronavirus 229E NOT DETECTED NOT DETECTED Final    Comment: (NOTE) The Coronavirus on the Respiratory Panel, DOES NOT test for the novel  Coronavirus (2019 nCoV)    Coronavirus HKU1 NOT DETECTED NOT DETECTED Final   Coronavirus NL63 NOT DETECTED NOT DETECTED Final   Coronavirus OC43 NOT DETECTED NOT DETECTED Final   Metapneumovirus NOT DETECTED NOT DETECTED Final   Rhinovirus / Enterovirus NOT DETECTED NOT DETECTED Final   Influenza A NOT DETECTED NOT DETECTED Final   Influenza B NOT DETECTED NOT DETECTED Final   Parainfluenza Virus 1 NOT DETECTED NOT DETECTED Final   Parainfluenza Virus 2 NOT DETECTED NOT DETECTED Final   Parainfluenza Virus 3 NOT DETECTED NOT DETECTED Final   Parainfluenza Virus 4 NOT DETECTED NOT DETECTED Final   Respiratory Syncytial Virus NOT DETECTED NOT DETECTED Final   Bordetella pertussis NOT DETECTED NOT DETECTED Final   Chlamydophila pneumoniae NOT DETECTED NOT DETECTED Final   Mycoplasma pneumoniae NOT DETECTED NOT DETECTED Final    Comment: Performed at [facility], [place] [address], [place], [place] [address]  Culture, blood (single)     Status: None (Preliminary result)   Collection Time: 01/17/20 10:41 AM   Specimen: BLOOD  Result Value Ref Range Status   Specimen Description   Final    BLOOD LEFT ANTECUBITAL Performed at [facility] [address], [place], [place] [address]    Special Requests   Final    BOTTLES DRAWN AEROBIC AND ANAEROBIC Blood Culture adequate volume Performed at [facility] [address] [place], [place] [address]    Culture   Final    NO  GROWTH 2 DAYS Performed at [facility] [address] [place], [place] [address]    Report Status PENDING  Incomplete    I have reviewed the micro and lab results.  Imaging: DG CHEST PORT 1 VIEW  Result Date: 01/17/2020 CLINICAL DATA:  Shortness of breath. EXAM: PORTABLE CHEST 1 VIEW COMPARISON:  January 16, 2020 FINDINGS: Stable position of the injectable port. Cardiomediastinal silhouette is normal. Mediastinal contours appear intact. Subtle peribronchial airspace opacities with lower lobe predominance, left greater than right. Osseous structures are without acute abnormality. Soft tissues are grossly normal. IMPRESSION: Subtle peribronchial airspace opacities with lower lobe predominance, left greater than right may represent early or atypical pneumonia. Electronically Signed   By: Fidela Salisbury M.D.   On: 01/17/2020 20:12   [place] CHEST SOFT TISSUE  Result Date: 01/17/2020 CLINICAL DATA:  Infection due to Port-A-Cath. EXAM: ULTRASOUND OF HEAD/NECK SOFT TISSUES TECHNIQUE: Ultrasound examination of the head and neck soft tissues was performed in the area of clinical concern. COMPARISON:  None. FINDINGS: Targeted sonographic evaluation in the area of clinical concern in the right chest, area around the port through the nipple were scanned. No evidence of fluid collection adjacent to the port or catheter. No significant skin thickening or edema. IMPRESSION: No evidence of fluid collection in the right chest wall related to chest port. Electronically Signed   By: Keith Rake M.D.   On: 01/17/2020 18:21       [facility] [phone] pager 01/19/2020, 10:14 AM

## 2020-01-19 NOTE — Progress Notes (Signed)
PROGRESS NOTE    Patrick Bautista  WIO:[id number] DOB: 10-29-1950 DOA: 01/16/2020 PCP: [name], L.[name], MD  Brief Narrative:Patrick Bautista is a 69 y.o. male with medical history significant of B-cell lymphoma. Presents with increasing fatigue and fever. Reports symptoms for last week. Initially started with sinus drainage. He spoke with his physician and they recommended saline flushes. This didn't help. He started having fevers up to 103 degrees. He talked with his physician again and he was given a z-pack. He completed all but the last day of the z-pack. He went to this oncologist and was recommended ED follow up due to fever and low blood pressures. He denies any sick contacts. He denies any other alleviating or aggravating factors.   ED Course: Workup revealed fevers, elevated RR, and elevated HR. CT sinus obtained, as well as CXR. Both were unremarkable. COVID screen negative. Flu screen negative. EDP spoke with oncology. TRH called for admission.     Assessment & Plan:   Principal Problem:   Sepsis ([place]) Active Problems:   Diffuse large B cell lymphoma ([place])   Port-A-Cath in place   Pneumonia    #1 sepsis likely secondary to HCAP though initial chest x-ray was negative -patient presented with fever T-max 103 tachycardic heart rate 105 tachypneic respiratory rate 24 blood pressure 99/55 hypotensive. Panculture done in the ER started on Vanco cefepime and Flagyl.ID stopped flagyl 9/26 Patient is also immunosuppressed from recent chemo with history of lymphoma non-Hodgkin's lymphoma He does not have leukocytosis or lactic acidosis procalcitonin is 0.48 however this may be masked due to immunocompromise state. Blood culture from the periphery and port no growth so far Tylenol and Motrin for fever.  Initial Chest x-ray clear UA negative Repeat chest x-ray 01/17/2020?  Early pneumonia Covid negative flu negative respiratory panel negative  CT of the sinuses is clear Patient has no  diarrhea ?viral fever ?fungal infection ?tumor fever Appreciate ID and oncology input.   Port-A-Cath to be taken out by IR today.  Patient still spiking temp though temperature curve is coming down.  #2 history of non-Hodgkin's lymphoma status post chemo 2 [place] ago, patient received intrathecal methotrexate and R CHOP.  #3 type 2 diabetes on SSI CBG (last 3)  Recent Labs    01/16/20 2244  GLUCAP 133*     #4 hypertension- patient with a history of hypertension but now he is hypotensive likely from sepsis hold home meds.  Blood pressure improved 124/65.  #5  Anemia his hemoglobin dropped to 6.3 from 8.1 without any active bleeding noted.  Could be secondary to recent chemo.  He transfused 1 unit of packed RBC.  Hemoglobin went to 8.7 from 6.3.  FOBT negative.  Estimated body mass index is 29.47 kg/m as calculated from the following:   Height as of this encounter: 5\' 10"  (1.778 m).   Weight as of this encounter: 93.2 kg.  DVT prophylaxis: Heparin Code Status: Partial code Family Communication: None at bedside  disposition Plan:  Status is: Inpatient  Dispo: The patient is from: Home              Anticipated d/c is to: Home              Anticipated d/c date is: > 3 days              Patient currently is not medically stable to d/c.    Consultants:    id  Procedures: none Antimicrobials: Anti-infectives (From admission, onward)   Start  Dose/Rate Route Frequency Ordered Stop   01/17/20 0000  vancomycin (VANCOREADY) IVPB 750 mg/150 mL        750 mg 150 mL/hr over 60 Minutes Intravenous Every 12 hours 01/16/20 1917     01/16/20 2215  metroNIDAZOLE (FLAGYL) IVPB 500 mg  Status:  Discontinued        500 mg 100 mL/hr over 60 Minutes Intravenous Every 8 hours 01/16/20 2127 01/16/20 2131   01/16/20 2200  ceFEPIme (MAXIPIME) 2 g in sodium chloride 0.9 % 100 mL IVPB        2 g 200 mL/hr over 30 Minutes Intravenous Every 12 hours 01/16/20 1910     01/16/20 2200   metroNIDAZOLE (FLAGYL) IVPB 500 mg  Status:  Discontinued        500 mg 100 mL/hr over 60 Minutes Intravenous Every 8 hours 01/16/20 2041 01/18/20 1852   01/16/20 1145  vancomycin (VANCOREADY) IVPB 1750 mg/350 mL        1,750 mg 175 mL/hr over 120 Minutes Intravenous  Once 01/16/20 1125 01/16/20 1508   01/16/20 1115  ceFEPIme (MAXIPIME) 2 g in sodium chloride 0.9 % 100 mL IVPB        2 g 200 mL/hr over 30 Minutes Intravenous  Once 01/16/20 1109 01/16/20 1238   01/16/20 1115  metroNIDAZOLE (FLAGYL) IVPB 500 mg        500 mg 100 mL/hr over 60 Minutes Intravenous  Once 01/16/20 1109 01/16/20 1314   01/16/20 1115  vancomycin (VANCOCIN) IVPB 1000 mg/200 mL premix  Status:  Discontinued        1,000 mg 200 mL/hr over 60 Minutes Intravenous  Once 01/16/20 1109 01/16/20 1125       Subjective: He is feeling better still has a fever but better than yesterday temperature coming down No nausea vomiting has occasional cough Objective: Vitals:   01/19/20 0020 01/19/20 0232 01/19/20 0535 01/19/20 0828  BP: 112/62 (!) 122/59 (!) 103/55 124/65  Pulse: 88 94 71 76  Resp: 20 17 20 18   Temp: (!) 101.9 F (38.8 C) 99.4 F (37.4 C) 98.8 F (37.1 C) 99.7 F (37.6 C)  TempSrc: Oral Oral Oral Oral  SpO2: 92% 94% 94% 90%  Weight:      Height:        Intake/Output Summary (Last 24 hours) at 01/19/2020 1132 Last data filed at 01/19/2020 4627 Gross per 24 hour  Intake 2172.75 ml  Output 1126 ml  Net 1046.75 ml   Filed Weights   01/16/20 1044 01/16/20 1351  Weight: 93.2 kg 93.2 kg    Examination: Right upper chest port in place with no erythema surrounding it no tenderness General exam: Appears calm and comfortable  Respiratory system: Few scattered rhonchi otherwise clear to auscultation. Respiratory effort normal. Cardiovascular system: S1 & S2 heard, RRR. No JVD, murmurs, rubs, gallops or clicks. No pedal edema. Gastrointestinal system: Abdomen is nondistended, soft and nontender. No  organomegaly or masses felt. Normal bowel sounds heard. Central nervous system: Alert and oriented. No focal neurological deficits. Extremities: Symmetric 5 x 5 power. Skin: No rashes, lesions or ulcers Psychiatry: Judgement and insight appear normal. Mood & affect appropriate.     Data Reviewed: I have personally reviewed following labs and imaging studies  CBC: Recent Labs  Lab 01/16/20 1108 01/18/20 0341 01/18/20 2327 01/19/20 0630  WBC 11.6* 10.6* 11.3* 9.3  NEUTROABS 9.0*  --  9.3*  --   HGB 8.1* 6.3* 8.4* 8.7*  HCT 23.7* 18.9* 25.5* 25.5*  MCV 94.0 95.0 90.7 91.4  PLT 271 294 335 102   Basic Metabolic Panel: Recent Labs  Lab 01/16/20 1108 01/18/20 0341 01/19/20 0630  NA 135 138 138  K 3.5 3.7 3.9  CL 98 105 107  CO2 24 23 24   GLUCOSE 258* 96 93  BUN 18 14 13   CREATININE 1.38* 1.25* 1.20  CALCIUM 9.4 8.3* 8.5*   GFR: Estimated Creatinine Clearance: 65.7 mL/min (by C-G formula based on SCr of 1.2 mg/dL). Liver Function Tests: Recent Labs  Lab 01/16/20 1108 01/18/20 0341 01/19/20 0630  AST 20 18 23   ALT 20 18 17   ALKPHOS 88 62 58  BILITOT 0.1* 0.3 0.4  PROT 6.7 5.4* 5.6*  ALBUMIN 3.4* 2.5* 2.6*   No results for input(s): LIPASE, AMYLASE in the last 168 hours. No results for input(s): AMMONIA in the last 168 hours. Coagulation Profile: Recent Labs  Lab 01/16/20 1108 01/17/20 0042  INR 1.1 1.2   Cardiac Enzymes: No results for input(s): CKTOTAL, CKMB, CKMBINDEX, TROPONINI in the last 168 hours. BNP (last 3 results) No results for input(s): PROBNP in the last 8760 hours. HbA1C: Recent Labs    01/16/20 2141  HGBA1C 9.6*   CBG: Recent Labs  Lab 01/16/20 2244  GLUCAP 133*   Lipid Profile: No results for input(s): CHOL, HDL, LDLCALC, TRIG, CHOLHDL, LDLDIRECT in the last 72 hours. Thyroid Function Tests: No results for input(s): TSH, T4TOTAL, FREET4, T3FREE, THYROIDAB in the last 72 hours. Anemia Panel: No results for input(s):  VITAMINB12, FOLATE, FERRITIN, TIBC, IRON, RETICCTPCT in the last 72 hours. Sepsis Labs: Recent Labs  Lab 01/16/20 1108 01/16/20 2146 01/17/20 0042  PROCALCITON  --   --  0.43  LATICACIDVEN 1.5 1.5 1.5    Recent Results (from the past 240 hour(s))  Respiratory Panel by RT PCR (Flu A&B, Covid) - Nasopharyngeal Swab     Status: None   Collection Time: 01/16/20 11:09 AM   Specimen: Nasopharyngeal Swab  Result Value Ref Range Status   SARS Coronavirus 2 by RT PCR NEGATIVE NEGATIVE Final    Comment: (NOTE) SARS-CoV-2 target nucleic acids are NOT DETECTED.  The SARS-CoV-2 RNA is generally detectable in upper respiratoy specimens during the acute phase of infection. The lowest concentration of SARS-CoV-2 viral copies this assay can detect is 131 copies/mL. A negative result does not preclude SARS-Cov-2 infection and should not be used as the sole basis for treatment or other patient management decisions. A negative result may occur with  improper specimen collection/handling, submission of specimen other than nasopharyngeal swab, presence of viral mutation(s) within the areas targeted by this assay, and inadequate number of viral copies (<131 copies/mL). A negative result must be combined with clinical observations, patient history, and epidemiological information. The expected result is Negative.  Fact Sheet for Patients:  PinkCheek.be  Fact Sheet for Healthcare Providers:  GravelBags.it  This test is no t yet approved or cleared by the [place] FDA and  has been authorized for detection and/or diagnosis of SARS-CoV-2 by FDA under an Emergency Use Authorization (EUA). This EUA will remain  in effect (meaning this test can be used) for the duration of the COVID-19 declaration under Section 564(b)(1) of the Act, 21 U.S.C. section 360bbb-3(b)(1), unless the authorization is terminated or revoked sooner.     Influenza  A by PCR NEGATIVE NEGATIVE Final   Influenza B by PCR NEGATIVE NEGATIVE Final    Comment: (NOTE) The Xpert Xpress SARS-CoV-2/FLU/RSV assay is intended as an aid in  the diagnosis of influenza from Nasopharyngeal swab specimens and  should not be used as a sole basis for treatment. Nasal washings and  aspirates are unacceptable for Xpert Xpress SARS-CoV-2/FLU/RSV  testing.  Fact Sheet for Patients: PinkCheek.be  Fact Sheet for Healthcare Providers: GravelBags.it  This test is not yet approved or cleared by the [place] FDA and  has been authorized for detection and/or diagnosis of SARS-CoV-2 by  FDA under an Emergency Use Authorization (EUA). This EUA will remain  in effect (meaning this test can be used) for the duration of the  Covid-19 declaration under Section 564(b)(1) of the Act, 21  U.S.C. section 360bbb-3(b)(1), unless the authorization is  terminated or revoked. Performed at [facility], [place] [address] [place], [place] [address]   Blood Culture (routine x 2)     Status: None (Preliminary result)   Collection Time: 01/16/20 11:13 AM   Specimen: BLOOD  Result Value Ref Range Status   Specimen Description   Final    BLOOD LEFT WRIST Performed at [place] [address] [place], [place] [address]    Special Requests   Final    BOTTLES DRAWN AEROBIC AND ANAEROBIC Blood Culture adequate volume Performed at [place] [address], [place], [place] [address]    Culture   Final    NO GROWTH 3 DAYS Performed at [facility], [place] [address], [place], [place] [address]    Report Status PENDING  Incomplete  Urine culture     Status: None   Collection Time: 01/16/20 11:21 AM   Specimen: In/Out Cath Urine  Result Value Ref Range Status   Specimen Description   Final    IN/OUT CATH URINE Performed at [place]  [address], [place], [place] [address]    Special Requests   Final    NONE Performed at [facility], [place] [address] [place], [place] [address]    Culture   Final    NO GROWTH Performed at [facility], [place] [address], [place], [place] [address]    Report Status 01/17/2020 FINAL  Final  Blood Culture (routine x 2)     Status: None (Preliminary result)   Collection Time: 01/16/20 11:35 AM   Specimen: BLOOD  Result Value Ref Range Status   Specimen Description   Final    BLOOD RIGHT ANTECUBITAL Performed at [place] [address], [place], [place] [address]    Special Requests   Final    BOTTLES DRAWN AEROBIC AND ANAEROBIC Blood Culture results may not be optimal due to an excessive volume of blood received in culture bottles Performed at [facility] [place] [address], [place], [place] [address]    Culture   Final    NO GROWTH 3 DAYS Performed at [facility], [place] [address] [place], [place] [address]    Report Status PENDING  Incomplete  Respiratory Panel by PCR     Status: None   Collection Time: 01/16/20  4:06 PM   Specimen: Nasopharyngeal Swab; Respiratory  Result Value Ref Range Status   Adenovirus NOT DETECTED NOT DETECTED Final   Coronavirus 229E NOT DETECTED NOT DETECTED Final    Comment: (NOTE) The Coronavirus on the Respiratory Panel, DOES NOT test for the novel  Coronavirus (2019 nCoV)    Coronavirus HKU1 NOT DETECTED NOT DETECTED Final   Coronavirus NL63 NOT DETECTED NOT DETECTED Final  Coronavirus OC43 NOT DETECTED NOT DETECTED Final   Metapneumovirus NOT DETECTED NOT DETECTED Final   Rhinovirus / Enterovirus NOT DETECTED NOT DETECTED Final   Influenza A NOT DETECTED NOT DETECTED Final   Influenza B NOT DETECTED NOT DETECTED Final   Parainfluenza Virus 1 NOT DETECTED NOT DETECTED Final   Parainfluenza Virus 2 NOT DETECTED NOT DETECTED Final   Parainfluenza Virus 3 NOT DETECTED NOT  DETECTED Final   Parainfluenza Virus 4 NOT DETECTED NOT DETECTED Final   Respiratory Syncytial Virus NOT DETECTED NOT DETECTED Final   Bordetella pertussis NOT DETECTED NOT DETECTED Final   Chlamydophila pneumoniae NOT DETECTED NOT DETECTED Final   Mycoplasma pneumoniae NOT DETECTED NOT DETECTED Final    Comment: Performed at [facility], [place] [address], [place], [place] [address]  Culture, blood (single)     Status: None (Preliminary result)   Collection Time: 01/17/20 10:41 AM   Specimen: BLOOD  Result Value Ref Range Status   Specimen Description   Final    BLOOD LEFT ANTECUBITAL Performed at [place] [address], [place], [place] [address]    Special Requests   Final    BOTTLES DRAWN AEROBIC AND ANAEROBIC Blood Culture adequate volume Performed at [facility] [place] [address] [place], [place] [address]    Culture   Final    NO GROWTH 2 DAYS Performed at [place] [address] [place], [place] [address]    Report Status PENDING  Incomplete  MRSA PCR Screening     Status: None   Collection Time: 01/19/20  9:36 AM   Specimen: Nasal Mucosa; Nasopharyngeal  Result Value Ref Range Status   MRSA by PCR NEGATIVE NEGATIVE Final    Comment:        The GeneXpert MRSA Assay (FDA approved for NASAL specimens only), is one component of a comprehensive MRSA colonization surveillance program. It is not intended to diagnose MRSA infection nor to guide or monitor treatment for MRSA infections. Performed at [facility], [place] [address] [place], [place] [address]          Radiology Studies: DG CHEST PORT 1 VIEW  Result Date: 01/17/2020 CLINICAL DATA:  Shortness of breath. EXAM: PORTABLE CHEST 1 VIEW COMPARISON:  January 16, 2020 FINDINGS: Stable position of the injectable port. Cardiomediastinal silhouette is normal. Mediastinal contours appear intact. Subtle peribronchial airspace opacities  with lower lobe predominance, left greater than right. Osseous structures are without acute abnormality. Soft tissues are grossly normal. IMPRESSION: Subtle peribronchial airspace opacities with lower lobe predominance, left greater than right may represent early or atypical pneumonia. Electronically Signed   By: Fidela Salisbury M.D.   On: 01/17/2020 20:12   [place] CHEST SOFT TISSUE  Result Date: 01/17/2020 CLINICAL DATA:  Infection due to Port-A-Cath. EXAM: ULTRASOUND OF HEAD/NECK SOFT TISSUES TECHNIQUE: Ultrasound examination of the head and neck soft tissues was performed in the area of clinical concern. COMPARISON:  None. FINDINGS: Targeted sonographic evaluation in the area of clinical concern in the right chest, area around the port through the nipple were scanned. No evidence of fluid collection adjacent to the port or catheter. No significant skin thickening or edema. IMPRESSION: No evidence of fluid collection in the right chest wall related to chest port. Electronically Signed   By: Keith Rake M.D.   On: 01/17/2020 18:21        Scheduled Meds: . benzonatate  100 mg Oral TID  . Chlorhexidine  Gluconate Cloth  6 each Topical Daily  . heparin  5,000 Units Subcutaneous Q8H  . insulin aspart  0-15 Units Subcutaneous TID WC  . insulin aspart  0-5 Units Subcutaneous QHS  . insulin glargine  30 Units Subcutaneous Daily  . pantoprazole  40 mg Oral Daily  . sodium chloride flush  10-40 mL Intracatheter Q12H  . sodium chloride flush  3 mL Intravenous Q12H   Continuous Infusions: . ceFEPime (MAXIPIME) IV 2 g (01/19/20 0931)  . vancomycin 750 mg (01/19/20 0020)     LOS: 3 days     Georgette Shell, MD 01/19/2020, 11:32 AM

## 2020-01-19 NOTE — Plan of Care (Signed)

## 2020-01-19 NOTE — Plan of Care (Signed)
  Problem: Activity: Goal: Risk for activity intolerance will decrease Outcome: Progressing   Problem: Education: Goal: Knowledge of General Education information will improve Description: Including pain rating scale, medication(s)/side effects and non-pharmacologic comfort measures Outcome: Completed/Met

## 2020-01-19 NOTE — Progress Notes (Signed)
VAST consulted to cap port. Called unit and spoke with unit RN; advised that it will be awhile before I can get to bedside to cap port. Confirmed that this port will be removed this afternoon; if VAST RN unable to get to bedside prior to transfer to OR, unit RN can unhook IV line from port since it is being removed. Unit nurse verbalized understanding.

## 2020-01-19 NOTE — Care Management Important Message (Signed)
Important Message  Patient Details IM Letter given to the Patient Name: Patrick Bautista MRN: [id number] Date of Birth: 17-Apr-1951   Medicare Important Message Given:  Yes     Kerin Salen 01/19/2020, 12:37 PM

## 2020-01-19 NOTE — Progress Notes (Signed)
PHARMACY NOTE -  Cefepime  Pharmacy has been assisting with dosing of cefepime for PNA.  Will adjust dose to 2g IV q12 hr with improved renal function; future renal adjustments covered by P&T approved abx renal adjustment policy  Vanc discontinued with negative MRSA PCR  ID following  Pharmacy will sign off, following peripherally for culture results or dose adjustments. Please reconsult if a change in clinical status warrants re-evaluation of dosage.  Reuel Boom, PharmD, BCPS [phone] 01/19/2020, 12:27 PM

## 2020-01-19 NOTE — Procedures (Signed)
Interventional Radiology Procedure Note  Procedure: Removal of a right IJ approach single lumen PowerPort.    Findings: No erythema.  No fluid.  No purulent material.  In good repair.   Port pocket closed primarily.   Complications: None  Recommendations:  - Ok to shower tomorrow - Do not submerge for 7 days - Routine wound care - follow up tip culture   Signed,  Dulcy Fanny. Earleen Newport, DO

## 2020-01-19 NOTE — Progress Notes (Signed)
Patient had procedure to have port removed today. Patient came back from procedure stable and alert with no changes from assessment. Patient had to have IV removed and IV replaced due to leaking from the right [facility] to the left lateral forearm. Patient is currently stable and alert and is resting in the bed. Patient was NPO at the beginning of this shift and now is on a heart healthy/carb modified diet. Patient's CBGd have been in the normal range this shift and has not needed insulin.

## 2020-01-20 DIAGNOSIS — G9341 Metabolic encephalopathy: Secondary | ICD-10-CM

## 2020-01-20 DIAGNOSIS — T80219A Unspecified infection due to central venous catheter, initial encounter: Principal | ICD-10-CM

## 2020-01-20 DIAGNOSIS — R509 Fever, unspecified: Secondary | ICD-10-CM

## 2020-01-20 LAB — CBC
HCT: 26 % — ABNORMAL LOW (ref 39.0–52.0)
Hemoglobin: 8.4 g/dL — ABNORMAL LOW (ref 13.0–17.0)
MCH: 30.1 pg (ref 26.0–34.0)
MCHC: 32.3 g/dL (ref 30.0–36.0)
MCV: 93.2 fL (ref 80.0–100.0)
Platelets: 426 10*3/uL — ABNORMAL HIGH (ref 150–400)
RBC: 2.79 MIL/uL — ABNORMAL LOW (ref 4.22–5.81)
RDW: 17.2 % — ABNORMAL HIGH (ref 11.5–15.5)
WBC: 8.5 10*3/uL (ref 4.0–10.5)
nRBC: 0 % (ref 0.0–0.2)

## 2020-01-20 LAB — GLUCOSE, CAPILLARY
Glucose-Capillary: 97 mg/dL (ref 70–99)
Glucose-Capillary: 192 mg/dL — ABNORMAL HIGH (ref 70–99)
Glucose-Capillary: 139 mg/dL — ABNORMAL HIGH (ref 70–99)
Glucose-Capillary: 187 mg/dL — ABNORMAL HIGH (ref 70–99)

## 2020-01-20 LAB — COMPREHENSIVE METABOLIC PANEL
ALT: 27 U/L (ref 0–44)
AST: 41 U/L (ref 15–41)
Albumin: 2.6 g/dL — ABNORMAL LOW (ref 3.5–5.0)
Alkaline Phosphatase: 61 U/L (ref 38–126)
Anion gap: 6 (ref 5–15)
BUN: 15 mg/dL (ref 8–23)
CO2: 23 mmol/L (ref 22–32)
Calcium: 8.3 mg/dL — ABNORMAL LOW (ref 8.9–10.3)
Chloride: 104 mmol/L (ref 98–111)
Creatinine, Ser: 1.04 mg/dL (ref 0.61–1.24)
GFR calc Af Amer: >60 mL/min (ref >60)
GFR calc non Af Amer: >60 mL/min (ref >60)
Glucose, Bld: 160 mg/dL — ABNORMAL HIGH (ref 70–99)
Potassium: 3.6 mmol/L (ref 3.5–5.1)
Sodium: 133 mmol/L — ABNORMAL LOW (ref 135–145)
Total Bilirubin: 0.4 mg/dL (ref 0.3–1.2)
Total Protein: 5.6 g/dL — ABNORMAL LOW (ref 6.5–8.1)

## 2020-01-20 MED ORDER — INSULIN GLARGINE 100 UNIT/ML ~~LOC~~ SOLN
35.0000 [IU] | Freq: Every day | SUBCUTANEOUS | Status: DC
  Administered 2020-01-21: 35 [IU] via SUBCUTANEOUS
  Filled 2020-01-20: qty 0.35

## 2020-01-20 NOTE — Evaluation (Signed)
Physical Therapy Evaluation Patient Details Name: Patrick Bautista MRN: [id number] DOB: Dec 20, 1950 Today's Date: 01/20/2020   History of Present Illness  69 y.o. male with medical history significant of B-cell lymphoma. Presents with increasing fatigue and fever.sepsis likely secondary to HCAP though initial chest x-ray was negative  Clinical Impression  Pt admitted with above diagnosis.   Pt is independent and active at his baseline. Limited today by fatigue, declined amb however agreeable to OOB. Requiring supervision for transfers. Anticipate steady progress in acute setting  Pt currently with functional limitations due to the deficits listed below (see PT Problem List). Pt will benefit from skilled PT to increase their independence and safety with mobility to allow discharge to the venue listed below.       Follow Up Recommendations No PT follow up    Equipment Recommendations  None recommended by PT    Recommendations for Other Services       Precautions / Restrictions Precautions Precautions: Fall Restrictions Weight Bearing Restrictions: No      Mobility  Bed Mobility Overal bed mobility: Independent                Transfers Overall transfer level: Needs assistance   Transfers: Sit to/from Stand;Stand Pivot Transfers Sit to Stand: Supervision         General transfer comment: for safety  Ambulation/Gait             General Gait Details: pt declined amb d/t being fatigued today  Stairs            Wheelchair Mobility    Modified Rankin (Stroke Patients Only)       Balance Overall balance assessment: Needs assistance   Sitting balance-Leahy Scale: Normal       Standing balance-Leahy Scale: Fair                               Pertinent Vitals/Pain Pain Assessment: Faces Faces Pain Scale: Hurts a little bit Pain Location: right shoulder Pain Descriptors / Indicators: Sore Pain Intervention(s): Limited activity within  patient's tolerance;Monitored during session    Home Living Family/patient expects to be discharged to:: Private residence Living Arrangements: Spouse/significant other (girlfriend)   Type of Home: House       Home Layout: One level Home Equipment: None      Prior Function Level of Independence: Independent         Comments: enjoys gardening     Hand Dominance        Extremity/Trunk Assessment   Upper Extremity Assessment Upper Extremity Assessment: Overall WFL for tasks assessed    Lower Extremity Assessment Lower Extremity Assessment: Overall WFL for tasks assessed       Communication   Communication: No difficulties  Cognition Arousal/Alertness: Awake/alert Behavior During Therapy: WFL for tasks assessed/performed Overall Cognitive Status: Within Functional Limits for tasks assessed                                        General Comments      Exercises     Assessment/Plan    PT Assessment Patient needs continued PT services  PT Problem List Decreased activity tolerance;Decreased mobility       PT Treatment Interventions DME instruction;Gait training;Functional mobility training;Patient/family education;Therapeutic exercise    PT Goals (Current goals can be found in the Care Plan  section)  Acute Rehab PT Goals Patient Stated Goal: home PT Goal Formulation: With patient Time For Goal Achievement: 02/02/20 Potential to Achieve Goals: Good    Frequency Min 3X/week   Barriers to discharge        Co-evaluation               AM-PAC PT "6 Clicks" Mobility  Outcome Measure Help needed turning from your back to your side while in a flat bed without using bedrails?: None Help needed moving from lying on your back to sitting on the side of a flat bed without using bedrails?: None Help needed moving to and from a bed to a chair (including a wheelchair)?: None Help needed standing up from a chair using your arms (e.g.,  wheelchair or bedside chair)?: None Help needed to walk in hospital room?: A Little Help needed climbing 3-5 steps with a railing? : A Little 6 Click Score: 22    End of Session   Activity Tolerance: Patient limited by fatigue Patient left: in chair;with call bell/phone within reach;with chair alarm set   PT Visit Diagnosis: Difficulty in walking, not elsewhere classified (R26.2)    Time: 1029-1050 PT Time Calculation (min) (ACUTE ONLY): 21 min   Charges:   PT Evaluation $PT Eval Low Complexity: 1 Low          Camarie Mctigue, PT  Acute Rehab Dept ([place]) [phone] Pager [phone]  01/20/2020   [facility] 01/20/2020, 10:56 AM

## 2020-01-20 NOTE — Plan of Care (Signed)
  Problem: Clinical Measurements: Goal: Cardiovascular complication will be avoided Outcome: Progressing   Problem: Activity: Goal: Risk for activity intolerance will decrease Outcome: Progressing   Problem: Nutrition: Goal: Adequate nutrition will be maintained Outcome: Progressing   

## 2020-01-20 NOTE — Progress Notes (Signed)
Patrick Bautista did have a little bit of a temperature spike last night.  It was up to 100.6.  I should really not be all surprised by this.  He still has little bit of sinus congestion.  He had a CT of the sinuses on Friday which was negative.  He has had no problems with nausea or vomiting.  He has had no bleeding.  He has had no cough.  He has had no rashes.  Again all of his cultures so far been negative.  We are awaiting the results from the cath tip.  I would think that since cultures were all negative to date, antibiotics can probably be stopped.  I suppose it may be some type of viral syndrome that he could have had.  His labs show white cell count of 8.5.  Hemoglobin 8.4.  Platelet count 426,000.  BUN is 15 creatinine 1.04.  Albumin is 2.6.  His vital signs this morning show temperature 98.4.  Pulse 76.  Blood pressure 114/74.  Oxygen saturation on room air is 95%.  Oral exam shows no mucositis.  His lungs sound clear bilaterally.  Cardiac exam regular rate and rhythm.  Abdomen is soft.  Bowel sounds are present.  Hopefully, the temperature spikes will moderate.  Again I would going to stop the antibiotics.  I just do not see any obvious bacterial infection at this point.  I would be surprised if the cath tip showed any infection.  Maybe, an incentive spirometry will help.  This will help prevent atelectasis.  I know that he is very appreciative of the great care that he is getting from the staff on [address].  Lattie Haw, MD  Psalm [phone]

## 2020-01-20 NOTE — Progress Notes (Signed)
Oncology short note  Met with Patrick Bautista for f/u. He is continued to be followed by Dr Marin Olp per his choice to transfer care. If continued fever despite neg cultures - would recommend consideration of Rituxan related ILD and G-CSF related CRS as possible explanations for fever and shortness. Alternatively other viral infections (not on viral panel) would be other possibilities. ID following. Further heme-onc cares and outpatient f/u per Dr Marin Olp.  Sullivan Lone

## 2020-01-20 NOTE — Progress Notes (Signed)
PROGRESS NOTE    Patrick Bautista  NID:[id number] DOB: 03/22/1951 DOA: 01/16/2020 PCP: [name], L.[name], MD  Brief Narrative:Patrick Bautista is a 69 y.o. 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  Work-up included blood cultures which was still negative CT of the sinuses negative for acute sinus infection initial chest x-ray did not show any infiltrates repeat chest x-ray after 48 hours of being admitted shows possible infiltrates and patient is continues to have a cough.  Cough is not very productive.  Covid screen was negative flu screen was negative.  He had a port that was taken out on 01/19/2020 thinking that could probably the source of his fever.  He was treated with Vanco cefepime on admission.  This has been stopped today 01/20/2020 since patient has not grown anything in his cultures.  Assessment & Plan:   Principal Problem:   Sepsis ([place]) Active Problems:   Diffuse large B cell lymphoma ([place])   Port-A-Cath in place   Pneumonia   Fever   Infection due to Port-A-Cath    #1 sepsis likely secondary to HCAP though initial chest x-ray was negative -patient presented with fever T-max 103 tachycardic heart rate 105 tachypneic respiratory rate 24 blood pressure 99/55 hypotensive. Panculture done in the ER started on Vanco cefepime and Flagyl.ID stopped flagyl 9/26 vanco stopped 9/27 MRSA PCR was negative Cefepime stopped 9/28 However he has completed a 5-day course of cefepime Patient is also immunosuppressed from recent chemo with history of lymphoma non-Hodgkin's lymphoma He does not have leukocytosis or lactic acidosis procalcitonin is 0.48 however this may be masked due to immunocompromise state. Blood culture from the periphery and port no growth so far Port-A-Cath removed 01/19/2020 and tip sent for culture. Tylenol and Motrin for fever.  Initial Chest x-ray clear UA negative Repeat  chest x-ray 01/17/2020?  Early pneumonia Covid negative flu negative respiratory panel negative  CT of the sinuses is clear Patient has no diarrhea ?viral fever ?fungal infection ?tumor fever ? Chemo induced fever Appreciate ID and oncology input.    #2 history of non-Hodgkin's lymphoma status post chemo 2 [place] ago 5 out of 6 patient does not want to have any further chemotherapy  #3 type 2 diabetes on SSI and Lantus, increase Lantus to 35 units nightly. CBG (last 3)  Recent Labs    01/19/20 2110 01/20/20 0720 01/20/20 1122  GLUCAP 192* 139* 187*     #4 hypertension-blood pressure 114/74, he was very hypotensive on admission so antihypertensives were on hold and it is still on hold.   #5  Anemia his hemoglobin dropped to 6.3 from 8.1 without any active bleeding noted.  Could be secondary to recent chemo.  He transfused 1 unit of packed RBC.  Hemoglobin went to 8.4 from 6.3.  FOBT negative.  Estimated body mass index is 29.47 kg/m as calculated from the following:   Height as of this encounter: 5\' 10"  (1.778 m).   Weight as of this encounter: 93.2 kg.  DVT prophylaxis: Heparin Code Status: Partial code Family Communication: None at bedside  disposition Plan:  Status is: Inpatient  Dispo: The patient is from: Home              Anticipated d/c is to: Home              Anticipated d/c date is: > 3 days  Patient currently is not medically stable to d/c.   Patient being observed off antibiotics need to be fever free prior to discharge. Consultants:    id, oncology  Procedures: none Antimicrobials: Anti-infectives (From admission, onward)   Start     Dose/Rate Route Frequency Ordered Stop   01/19/20 1600  ceFEPIme (MAXIPIME) 2 g in sodium chloride 0.9 % 100 mL IVPB  Status:  Discontinued        2 g 200 mL/hr over 30 Minutes Intravenous Every 8 hours 01/19/20 1226 01/20/20 0714   01/19/20 1432  vancomycin (VANCOCIN) IVPB 1000 mg/200 mL premix        over 60  Minutes  Continuous PRN 01/19/20 1438 01/19/20 1432   01/19/20 1430  vancomycin (VANCOCIN) IVPB 1000 mg/200 mL premix        1,000 mg 200 mL/hr over 60 Minutes Intravenous  Once 01/19/20 1438 01/19/20 1532   01/17/20 0000  vancomycin (VANCOREADY) IVPB 750 mg/150 mL  Status:  Discontinued        750 mg 150 mL/hr over 60 Minutes Intravenous Every 12 hours 01/16/20 1917 01/19/20 1218   01/16/20 2215  metroNIDAZOLE (FLAGYL) IVPB 500 mg  Status:  Discontinued        500 mg 100 mL/hr over 60 Minutes Intravenous Every 8 hours 01/16/20 2127 01/16/20 2131   01/16/20 2200  ceFEPIme (MAXIPIME) 2 g in sodium chloride 0.9 % 100 mL IVPB  Status:  Discontinued        2 g 200 mL/hr over 30 Minutes Intravenous Every 12 hours 01/16/20 1910 01/19/20 1226   01/16/20 2200  metroNIDAZOLE (FLAGYL) IVPB 500 mg  Status:  Discontinued        500 mg 100 mL/hr over 60 Minutes Intravenous Every 8 hours 01/16/20 2041 01/18/20 1852   01/16/20 1145  vancomycin (VANCOREADY) IVPB 1750 mg/350 mL        1,750 mg 175 mL/hr over 120 Minutes Intravenous  Once 01/16/20 1125 01/16/20 1508   01/16/20 1115  ceFEPIme (MAXIPIME) 2 g in sodium chloride 0.9 % 100 mL IVPB        2 g 200 mL/hr over 30 Minutes Intravenous  Once 01/16/20 1109 01/16/20 1238   01/16/20 1115  metroNIDAZOLE (FLAGYL) IVPB 500 mg        500 mg 100 mL/hr over 60 Minutes Intravenous  Once 01/16/20 1109 01/16/20 1314   01/16/20 1115  vancomycin (VANCOCIN) IVPB 1000 mg/200 mL premix  Status:  Discontinued        1,000 mg 200 mL/hr over 60 Minutes Intravenous  Once 01/16/20 1109 01/16/20 1125       Subjective: He coughed more than the other days I have seen him today. Sounds like a dry cough. Encouraged him to use incentive spirometry and get out of bed  Objective: Vitals:   01/19/20 2127 01/19/20 2311 01/20/20 0047 01/20/20 0445  BP: 119/71  (!) 110/55 114/74  Pulse: 90  100 76  Resp: 17  20 16   Temp: 98.4 F (36.9 C) (!) 100.4 F (38 C) (!) 100.6  F (38.1 C) 98.4 F (36.9 C)  TempSrc:  Oral Oral Oral  SpO2: 93%  91% 95%  Weight:      Height:        Intake/Output Summary (Last 24 hours) at 01/20/2020 1200 Last data filed at 01/20/2020 0951 Gross per 24 hour  Intake 1766.67 ml  Output 1625 ml  Net 141.67 ml   Filed Weights   01/16/20 1044 01/16/20 1351  Weight: 93.2 kg 93.2 kg    Examination: Right upper chest port in place with no erythema surrounding it no tenderness General exam: Appears calm and comfortable  Respiratory system: Few scattered rhonchi otherwise clear to auscultation. Respiratory effort normal. Cardiovascular system: S1 & S2 heard, RRR. No JVD, murmurs, rubs, gallops or clicks. No pedal edema. Gastrointestinal system: Abdomen is nondistended, soft and nontender. No organomegaly or masses felt. Normal bowel sounds heard. Central nervous system: Alert and oriented. No focal neurological deficits. Extremities: Symmetric 5 x 5 power. Skin: No rashes, lesions or ulcers Psychiatry: Judgement and insight appear normal. Mood & affect appropriate.     Data Reviewed: I have personally reviewed following labs and imaging studies  CBC: Recent Labs  Lab 01/16/20 1108 01/18/20 0341 01/18/20 2327 01/19/20 0630 01/20/20 0524  WBC 11.6* 10.6* 11.3* 9.3 8.5  NEUTROABS 9.0*  --  9.3*  --   --   HGB 8.1* 6.3* 8.4* 8.7* 8.4*  HCT 23.7* 18.9* 25.5* 25.5* 26.0*  MCV 94.0 95.0 90.7 91.4 93.2  PLT 271 294 335 349 962*   Basic Metabolic Panel: Recent Labs  Lab 01/16/20 1108 01/18/20 0341 01/19/20 0630 01/20/20 0524  NA 135 138 138 133*  K 3.5 3.7 3.9 3.6  CL 98 105 107 104  CO2 24 23 24 23   GLUCOSE 258* 96 93 160*  BUN 18 14 13 15   CREATININE 1.38* 1.25* 1.20 1.04  CALCIUM 9.4 8.3* 8.5* 8.3*   GFR: Estimated Creatinine Clearance: 75.8 mL/min (by C-G formula based on SCr of 1.04 mg/dL). Liver Function Tests: Recent Labs  Lab 01/16/20 1108 01/18/20 0341 01/19/20 0630 01/20/20 0524  AST 20 18 23  41   ALT 20 18 17 27   ALKPHOS 88 62 58 61  BILITOT 0.1* 0.3 0.4 0.4  PROT 6.7 5.4* 5.6* 5.6*  ALBUMIN 3.4* 2.5* 2.6* 2.6*   No results for input(s): LIPASE, AMYLASE in the last 168 hours. No results for input(s): AMMONIA in the last 168 hours. Coagulation Profile: Recent Labs  Lab 01/16/20 1108 01/17/20 0042  INR 1.1 1.2   Cardiac Enzymes: No results for input(s): CKTOTAL, CKMB, CKMBINDEX, TROPONINI in the last 168 hours. BNP (last 3 results) No results for input(s): PROBNP in the last 8760 hours. HbA1C: No results for input(s): HGBA1C in the last 72 hours. CBG: Recent Labs  Lab 01/19/20 1140 01/19/20 1704 01/19/20 2110 01/20/20 0720 01/20/20 1122  GLUCAP 80 97 192* 139* 187*   Lipid Profile: No results for input(s): CHOL, HDL, LDLCALC, TRIG, CHOLHDL, LDLDIRECT in the last 72 hours. Thyroid Function Tests: No results for input(s): TSH, T4TOTAL, FREET4, T3FREE, THYROIDAB in the last 72 hours. Anemia Panel: No results for input(s): VITAMINB12, FOLATE, FERRITIN, TIBC, IRON, RETICCTPCT in the last 72 hours. Sepsis Labs: Recent Labs  Lab 01/16/20 1108 01/16/20 2146 01/17/20 0042  PROCALCITON  --   --  0.43  LATICACIDVEN 1.5 1.5 1.5    Recent Results (from the past 240 hour(s))  Respiratory Panel by RT PCR (Flu A&B, Covid) - Nasopharyngeal Swab     Status: None   Collection Time: 01/16/20 11:09 AM   Specimen: Nasopharyngeal Swab  Result Value Ref Range Status   SARS Coronavirus 2 by RT PCR NEGATIVE NEGATIVE Final    Comment: (NOTE) SARS-CoV-2 target nucleic acids are NOT DETECTED.  The SARS-CoV-2 RNA is generally detectable in upper respiratoy specimens during the acute phase of infection. The lowest concentration of SARS-CoV-2 viral copies this assay can detect is 131 copies/mL.  A negative result does not preclude SARS-Cov-2 infection and should not be used as the sole basis for treatment or other patient management decisions. A negative result may occur with   improper specimen collection/handling, submission of specimen other than nasopharyngeal swab, presence of viral mutation(s) within the areas targeted by this assay, and inadequate number of viral copies (<131 copies/mL). A negative result must be combined with clinical observations, patient history, and epidemiological information. The expected result is Negative.  Fact Sheet for Patients:  PinkCheek.be  Fact Sheet for Healthcare Providers:  GravelBags.it  This test is no t yet approved or cleared by the [place] FDA and  has been authorized for detection and/or diagnosis of SARS-CoV-2 by FDA under an Emergency Use Authorization (EUA). This EUA will remain  in effect (meaning this test can be used) for the duration of the COVID-19 declaration under Section 564(b)(1) of the Act, 21 U.S.C. section 360bbb-3(b)(1), unless the authorization is terminated or revoked sooner.     Influenza A by PCR NEGATIVE NEGATIVE Final   Influenza B by PCR NEGATIVE NEGATIVE Final    Comment: (NOTE) The Xpert Xpress SARS-CoV-2/FLU/RSV assay is intended as an aid in  the diagnosis of influenza from Nasopharyngeal swab specimens and  should not be used as a sole basis for treatment. Nasal washings and  aspirates are unacceptable for Xpert Xpress SARS-CoV-2/FLU/RSV  testing.  Fact Sheet for Patients: PinkCheek.be  Fact Sheet for Healthcare Providers: GravelBags.it  This test is not yet approved or cleared by the [place] FDA and  has been authorized for detection and/or diagnosis of SARS-CoV-2 by  FDA under an Emergency Use Authorization (EUA). This EUA will remain  in effect (meaning this test can be used) for the duration of the  Covid-19 declaration under Section 564(b)(1) of the Act, 21  U.S.C. section 360bbb-3(b)(1), unless the authorization is  terminated or  revoked. Performed at [facility], [place] [address] [place], [place] [address]   Blood Culture (routine x 2)     Status: None (Preliminary result)   Collection Time: 01/16/20 11:13 AM   Specimen: BLOOD  Result Value Ref Range Status   Specimen Description   Final    BLOOD LEFT WRIST Performed at [facility] [address], [place], [place] [address]    Special Requests   Final    BOTTLES DRAWN AEROBIC AND ANAEROBIC Blood Culture adequate volume Performed at [facility] [address], [place], [place] [address]    Culture   Final    NO GROWTH 4 DAYS Performed at [facility], [place] [address] [place], [place] [address]    Report Status PENDING  Incomplete  Urine culture     Status: None   Collection Time: 01/16/20 11:21 AM   Specimen: In/Out Cath Urine  Result Value Ref Range Status   Specimen Description   Final    IN/OUT CATH URINE Performed at [place] [address] [place], [place] [address]    Special Requests   Final    NONE Performed at [facility], [place] [address], [place], [place] [address]    Culture   Final    NO GROWTH Performed at [facility], [place] [address] [place], [place] [address]    Report Status 01/17/2020 FINAL  Final  Blood Culture (routine x 2)     Status: None (Preliminary result)   Collection Time: 01/16/20 11:35 AM  Specimen: BLOOD  Result Value Ref Range Status   Specimen Description   Final    BLOOD RIGHT ANTECUBITAL Performed at [facility] [address], [place], [place] [address]    Special Requests   Final    BOTTLES DRAWN AEROBIC AND ANAEROBIC Blood Culture results may not be optimal due to an excessive volume of blood received in culture bottles Performed at [place] [address] [place], [place] [address]    Culture   Final    NO GROWTH 4 DAYS Performed at [facility], [place] [address] [place], [place] [address]    Report Status PENDING  Incomplete  Respiratory Panel by PCR     Status: None   Collection Time: 01/16/20  4:06 PM   Specimen: Nasopharyngeal Swab; Respiratory  Result Value Ref Range Status   Adenovirus NOT DETECTED NOT DETECTED Final   Coronavirus 229E NOT DETECTED NOT DETECTED Final    Comment: (NOTE) The Coronavirus on the Respiratory Panel, DOES NOT test for the novel  Coronavirus (2019 nCoV)    Coronavirus HKU1 NOT DETECTED NOT DETECTED Final   Coronavirus NL63 NOT DETECTED NOT DETECTED Final   Coronavirus OC43 NOT DETECTED NOT DETECTED Final   Metapneumovirus NOT DETECTED NOT DETECTED Final   Rhinovirus / Enterovirus NOT DETECTED NOT DETECTED Final   Influenza A NOT DETECTED NOT DETECTED Final   Influenza B NOT DETECTED NOT DETECTED Final   Parainfluenza Virus 1 NOT DETECTED NOT DETECTED Final   Parainfluenza Virus 2 NOT DETECTED NOT DETECTED Final   Parainfluenza Virus 3 NOT DETECTED NOT DETECTED Final   Parainfluenza Virus 4 NOT DETECTED NOT DETECTED Final   Respiratory Syncytial Virus NOT DETECTED NOT DETECTED Final   Bordetella pertussis NOT DETECTED NOT DETECTED Final   Chlamydophila pneumoniae NOT DETECTED NOT DETECTED Final   Mycoplasma pneumoniae NOT DETECTED NOT DETECTED Final    Comment: Performed at [facility], [place]. [address], [place], [place] [address]  Culture, blood (single)     Status: None (Preliminary result)   Collection Time: 01/17/20 10:41 AM   Specimen: BLOOD  Result Value Ref Range Status   Specimen Description   Final    BLOOD LEFT ANTECUBITAL Performed at [place] [address], [place], [place] [address]    Special Requests   Final    BOTTLES DRAWN AEROBIC AND ANAEROBIC Blood Culture adequate volume Performed at [facility] [facility] [address], [place], [place] [address]    Culture   Final    NO GROWTH 3 DAYS Performed at [facility], [place] [address] [place], [place] [address]    Report Status PENDING  Incomplete  Culture, blood (routine x 2)     Status: None (Preliminary result)   Collection Time: 01/17/20  2:56 PM   Specimen: BLOOD  Result Value Ref Range Status   Specimen Description   Final    BLOOD PORTA CATH Performed at [facility] [address], [place], [place] [address]    Special Requests   Final    BOTTLES DRAWN AEROBIC AND ANAEROBIC Blood Culture adequate volume Performed at [facility] [address], [place],  [address]    Culture   Final    NO GROWTH 3 DAYS Performed at [facility], [place] [address] [place],  [address]    Report Status PENDING  Incomplete  MRSA PCR Screening  Status: None   Collection Time: 01/19/20  9:36 AM   Specimen: Nasal Mucosa; Nasopharyngeal  Result Value Ref Range Status   MRSA by PCR NEGATIVE NEGATIVE Final    Comment:        The GeneXpert MRSA Assay (FDA approved for NASAL specimens only), is one component of a comprehensive MRSA colonization surveillance program. It is not intended to diagnose MRSA infection nor to guide or monitor treatment for MRSA infections. Performed at [facility], [place] [address], [place], [place] [address]   Cath Tip Culture     Status: None (Preliminary result)   Collection Time: 01/19/20  3:00 PM   Specimen: Catheter Tip; Other  Result Value Ref Range Status   Specimen Description   Final    CATH TIP Performed at [facility] [address], [place], [place] [address]    Special Requests   Final    NONE Performed at [facility], [place] [address] [place], [place] [address]    Culture   Final    NO GROWTH < 12 HOURS Performed at [facility] [address], [place], [place] [address]    Report Status PENDING  Incomplete         Radiology Studies: IR REMOVAL TUN ACCESS W/ PORT W/O  FL MOD SED  Result Date: 01/19/2020 INDICATION: 69 year old male with a history of un known fever origin referred for port removal EXAM: IMPLANTED PORT A CATH PLACEMENT WITH ULTRASOUND AND FLUOROSCOPIC GUIDANCE MEDICATIONS: 1 g vancomycin; The antibiotic was administered within an appropriate time interval prior to skin puncture. ANESTHESIA/SEDATION: Moderate (conscious) sedation was employed during this procedure. A total of Versed 1.0 mg and Fentanyl 50 mcg was administered intravenously. Moderate Sedation Time: 10 minutes. The patient's level of consciousness and vital signs were monitored continuously by radiology nursing throughout the procedure under my direct supervision. FLUOROSCOPY TIME:  None COMPLICATIONS: None PROCEDURE: Informed consent was obtained from the patient following an explanation of the procedure, risks, benefits and alternatives. The patient understands, agrees and consents for the procedure. All questions were addressed. A time out was performed prior to the initiation of the procedure. The patient was positioned in the operation suite in the supine position on a gantry. The previous scar on the right chest was generously infiltrated with 1% lidocaine for local anesthesia. Infiltration of the skin and subcutaneous tissues surrounding the port was performed. Using sharp and blunt dissection, the port apparatus and subcutaneous catheter were removed in their entirety. There was no erythema or purulent fluid. Given the appearance, primary closure was elected. The port pocket was then closed with interrupted Vicryl layer and a running subcuticular with 4-0 Monocryl. The skin was sealed with Derma bond. A sterile dressing was placed. The patient tolerated the procedure well and remained hemodynamically stable throughout. No complications were encountered and no significant blood loss was encountered. IMPRESSION: Status post bedside right IJ port catheter removal. Signed, Dulcy Fanny. Dellia Nims,  [facility] Radiology Electronically Signed   By: Corrie Mckusick D.O.   On: 01/19/2020 15:47        Scheduled Meds: . benzonatate  100 mg Oral TID  . heparin  5,000 Units Subcutaneous Q8H  . insulin aspart  0-15 Units Subcutaneous TID WC  . insulin aspart  0-5 Units Subcutaneous QHS  . insulin glargine  30 Units Subcutaneous Daily  . pantoprazole  40 mg Oral Daily  . sodium chloride  flush  3 mL Intravenous Q12H   Continuous Infusions: . dextrose 5 % and 0.9% NaCl 75 mL/hr at 01/20/20 0650     LOS: 4 days     Georgette Shell, MD 01/20/2020, 12:00 PM

## 2020-01-20 NOTE — Progress Notes (Signed)
Patient is alert, oriented, and stable. Patient has been in the bed, as well as up to the bedside chair. Patient has gone through this shift without a fever. PRN Robitussin DM was given to patient at 1554 for cough. Patient is back in the bed comfortable. Patient is using incentive spirometer and has met the goal of 1500 mL. Continue to monitor patient's use of incentive spirometer every hour while awake.

## 2020-01-20 NOTE — Progress Notes (Signed)
[facility] for Infectious Disease   Date of Admission:  01/16/2020   Total days of antibiotics: 5         Current antibiotics:                 Day 5 cefepime  Reason for visit: Follow up on fevers  Interval History: T-max 100.6 Satting okay on room air No new images No new micro Port a cath removed yesterday Cefepime stopped this morning  Assessment: Patrick Bautista is a 69 y.o. male. His fever curve has been trending down with broad spectrum antimicrobial coverage and he subjectively feels somewhat better.  We stopped his vancomycin yesterday due to negative MRSA nares PCR and other negative cultures.  His cefepime was stopped this morning which I think is reasonable given negative cultures and stable respiratory status.  I do not think that there will be much clinical value yielded from the catheter tip culture even if it is positive.   Recommendations: --Agree it is reasonable to observe off abx at this time with negative cultures.  He has received about 5 days of therapy if this were a bacterial pneumonia --Consider non-infectious etiology of chest imaging findings and fevers --Will sign off for now, but please call as needed  Principal Problem:   Sepsis ([place]) Active Problems:   Diffuse large B cell lymphoma ([place])   Port-A-Cath in place   Pneumonia   Fever   Infection due to Port-A-Cath  Subjective: Patient reports feeling improved this morning. Low grade fevers overnight Mild congestion  Scheduled Meds: . benzonatate  100 mg Oral TID  . heparin  5,000 Units Subcutaneous Q8H  . insulin aspart  0-15 Units Subcutaneous TID WC  . insulin aspart  0-5 Units Subcutaneous QHS  . insulin glargine  30 Units Subcutaneous Daily  . pantoprazole  40 mg Oral Daily  . sodium chloride flush  3 mL Intravenous Q12H   Continuous Infusions: . dextrose 5 % and 0.9% NaCl 75 mL/hr at 01/20/20 0650   PRN Meds:.acetaminophen **OR** acetaminophen,  guaiFENesin-dextromethorphan, ibuprofen, LORazepam, ondansetron **OR** ondansetron (ZOFRAN) IV, phenol, senna-docusate    Allergies  Allergen Reactions  . Hydrocodone Nausea And Vomiting  . Percocet [Oxycodone-Acetaminophen] Nausea And Vomiting  . Codeine Itching  . Penicillins Itching    Has patient had a PCN reaction causing immediate rash, facial/tongue/throat swelling, SOB or lightheadedness with hypotension: No Has patient had a PCN reaction causing severe rash involving mucus membranes or skin necrosis: No Has patient had a PCN reaction that required hospitalization No Has patient had a PCN reaction occurring within the last 10 years: No If all of the above answers are "NO", then may proceed with Cephalosporin use.   . Tape Itching and Rash     Paper Tape causes blisters; please use Coban wrap if possible     Review of Systems: Review of Systems  Constitutional: Positive for fever.  HENT: Positive for congestion. Negative for sinus pain and sore throat.   Eyes: Negative.   Respiratory: Positive for cough. Negative for shortness of breath and wheezing.   Cardiovascular: Negative for chest pain.  Musculoskeletal: Negative.   Skin: Negative for rash.  All other systems reviewed and are negative.    Objective: Blood pressure 114/74, pulse 76, temperature 98.4 F (36.9 C), temperature source Oral, resp. rate 16, height 5\' 10"  (1.778 m), weight 93.2 kg, SpO2 95 %. Body mass index is 29.47 kg/m.  Physical Exam Constitutional:  General: He is not in acute distress.    Appearance: Normal appearance.  HENT:     Head: Normocephalic and atraumatic.     Nose: Nose normal. No congestion.     Mouth/Throat:     Mouth: Mucous membranes are moist.     Pharynx: Oropharynx is clear.  Eyes:     General: No scleral icterus.    Extraocular Movements: Extraocular movements intact.     Conjunctiva/sclera: Conjunctivae normal.  Cardiovascular:     Comments: Right sided port  removed. Pulmonary:     Effort: Pulmonary effort is normal. No respiratory distress.  Musculoskeletal:        General: Normal range of motion.  Skin:    General: Skin is warm and dry.  Neurological:     General: No focal deficit present.     Mental Status: He is alert and oriented to person, place, and time.  Psychiatric:        Mood and Affect: Mood normal.        Behavior: Behavior normal.       Lab Results: Lab Results  Component Value Date   WBC 8.5 01/20/2020   HGB 8.4 (L) 01/20/2020   HCT 26.0 (L) 01/20/2020   MCV 93.2 01/20/2020   PLT 426 (H) 01/20/2020    Lab Results  Component Value Date   CREATININE 1.04 01/20/2020   BUN 15 01/20/2020   NA 133 (L) 01/20/2020   K 3.6 01/20/2020   CL 104 01/20/2020   CO2 23 01/20/2020    Lab Results  Component Value Date   ALT 27 01/20/2020   AST 41 01/20/2020   ALKPHOS 61 01/20/2020     Microbiology: Recent Results (from the past 240 hour(s))  Respiratory Panel by RT PCR (Flu A&B, Covid) - Nasopharyngeal Swab     Status: None   Collection Time: 01/16/20 11:09 AM   Specimen: Nasopharyngeal Swab  Result Value Ref Range Status   SARS Coronavirus 2 by RT PCR NEGATIVE NEGATIVE Final    Comment: (NOTE) SARS-CoV-2 target nucleic acids are NOT DETECTED.  The SARS-CoV-2 RNA is generally detectable in upper respiratoy specimens during the acute phase of infection. The lowest concentration of SARS-CoV-2 viral copies this assay can detect is 131 copies/mL. A negative result does not preclude SARS-Cov-2 infection and should not be used as the sole basis for treatment or other patient management decisions. A negative result may occur with  improper specimen collection/handling, submission of specimen other than nasopharyngeal swab, presence of viral mutation(s) within the areas targeted by this assay, and inadequate number of viral copies (<131 copies/mL). A negative result must be combined with clinical observations, patient  history, and epidemiological information. The expected result is Negative.  Fact Sheet for Patients:  PinkCheek.be  Fact Sheet for Healthcare Providers:  GravelBags.it  This test is no t yet approved or cleared by the [place] FDA and  has been authorized for detection and/or diagnosis of SARS-CoV-2 by FDA under an Emergency Use Authorization (EUA). This EUA will remain  in effect (meaning this test can be used) for the duration of the COVID-19 declaration under Section 564(b)(1) of the Act, 21 U.S.C. section 360bbb-3(b)(1), unless the authorization is terminated or revoked sooner.     Influenza A by PCR NEGATIVE NEGATIVE Final   Influenza B by PCR NEGATIVE NEGATIVE Final    Comment: (NOTE) The Xpert Xpress SARS-CoV-2/FLU/RSV assay is intended as an aid in  the diagnosis of influenza from Nasopharyngeal swab specimens  and  should not be used as a sole basis for treatment. Nasal washings and  aspirates are unacceptable for Xpert Xpress SARS-CoV-2/FLU/RSV  testing.  Fact Sheet for Patients: PinkCheek.be  Fact Sheet for Healthcare Providers: GravelBags.it  This test is not yet approved or cleared by the [place] FDA and  has been authorized for detection and/or diagnosis of SARS-CoV-2 by  FDA under an Emergency Use Authorization (EUA). This EUA will remain  in effect (meaning this test can be used) for the duration of the  Covid-19 declaration under Section 564(b)(1) of the Act, 21  U.S.C. section 360bbb-3(b)(1), unless the authorization is  terminated or revoked. Performed at [facility], [place] [address] [place], [place] [address]   Blood Culture (routine x 2)     Status: None (Preliminary result)   Collection Time: 01/16/20 11:13 AM   Specimen: BLOOD  Result Value Ref Range Status   Specimen Description   Final    BLOOD LEFT  WRIST Performed at [facility] [address] [place], [place] [address]    Special Requests   Final    BOTTLES DRAWN AEROBIC AND ANAEROBIC Blood Culture adequate volume Performed at [facility] [address], [place], [place] [address]    Culture   Final    NO GROWTH 4 DAYS Performed at [facility], [place] [address], [place], [place] [address]    Report Status PENDING  Incomplete  Urine culture     Status: None   Collection Time: 01/16/20 11:21 AM   Specimen: In/Out Cath Urine  Result Value Ref Range Status   Specimen Description   Final    IN/OUT CATH URINE Performed at [facility] [address], [place], [place] [address]    Special Requests   Final    NONE Performed at [facility], [place] [address] [place], [place] [address]    Culture   Final    NO GROWTH Performed at [facility], [place] [address] [place], [place] [address]    Report Status 01/17/2020 FINAL  Final  Blood Culture (routine x 2)     Status: None (Preliminary result)   Collection Time: 01/16/20 11:35 AM   Specimen: BLOOD  Result Value Ref Range Status   Specimen Description   Final    BLOOD RIGHT ANTECUBITAL Performed at [facility] [address], [place], [place] [address]    Special Requests   Final    BOTTLES DRAWN AEROBIC AND ANAEROBIC Blood Culture results may not be optimal due to an excessive volume of blood received in culture bottles Performed at [facility] [address] [place], [place] [address]    Culture   Final    NO GROWTH 4 DAYS Performed at [facility], [place] [address] [place],  [address]    Report Status PENDING  Incomplete  Respiratory Panel by PCR     Status: None   Collection Time: 01/16/20  4:06 PM   Specimen: Nasopharyngeal Swab; Respiratory  Result Value Ref Range Status   Adenovirus NOT DETECTED NOT DETECTED Final    Coronavirus 229E NOT DETECTED NOT DETECTED Final    Comment: (NOTE) The Coronavirus on the Respiratory Panel, DOES NOT test for the novel  Coronavirus (2019 nCoV)    Coronavirus HKU1 NOT DETECTED NOT DETECTED Final   Coronavirus NL63 NOT DETECTED NOT DETECTED Final   Coronavirus OC43 NOT DETECTED NOT  DETECTED Final   Metapneumovirus NOT DETECTED NOT DETECTED Final   Rhinovirus / Enterovirus NOT DETECTED NOT DETECTED Final   Influenza A NOT DETECTED NOT DETECTED Final   Influenza B NOT DETECTED NOT DETECTED Final   Parainfluenza Virus 1 NOT DETECTED NOT DETECTED Final   Parainfluenza Virus 2 NOT DETECTED NOT DETECTED Final   Parainfluenza Virus 3 NOT DETECTED NOT DETECTED Final   Parainfluenza Virus 4 NOT DETECTED NOT DETECTED Final   Respiratory Syncytial Virus NOT DETECTED NOT DETECTED Final   Bordetella pertussis NOT DETECTED NOT DETECTED Final   Chlamydophila pneumoniae NOT DETECTED NOT DETECTED Final   Mycoplasma pneumoniae NOT DETECTED NOT DETECTED Final    Comment: Performed at [facility], [place] [address] [place], [place] [address]  Culture, blood (single)     Status: None (Preliminary result)   Collection Time: 01/17/20 10:41 AM   Specimen: BLOOD  Result Value Ref Range Status   Specimen Description   Final    BLOOD LEFT ANTECUBITAL Performed at [facility] [address], [place], [place] [address]    Special Requests   Final    BOTTLES DRAWN AEROBIC AND ANAEROBIC Blood Culture adequate volume Performed at [facility] [address] [place], [place] [address]    Culture   Final    NO GROWTH 3 DAYS Performed at [facility], [place] [address], [place], [place] [address]    Report Status PENDING  Incomplete  Culture, blood (routine x 2)     Status: None (Preliminary result)   Collection Time: 01/17/20  2:56 PM   Specimen: BLOOD  Result Value Ref Range Status   Specimen Description   Final    BLOOD PORTA  CATH Performed at [facility] [address], [place], [place] [address]    Special Requests   Final    BOTTLES DRAWN AEROBIC AND ANAEROBIC Blood Culture adequate volume Performed at [facility] [address] [place], [place] [address]    Culture   Final    NO GROWTH 3 DAYS Performed at [facility], [place] [address], [place], [place] [address]    Report Status PENDING  Incomplete  MRSA PCR Screening     Status: None   Collection Time: 01/19/20  9:36 AM   Specimen: Nasal Mucosa; Nasopharyngeal  Result Value Ref Range Status   MRSA by PCR NEGATIVE NEGATIVE Final    Comment:        The GeneXpert MRSA Assay (FDA approved for NASAL specimens only), is one component of a comprehensive MRSA colonization surveillance program. It is not intended to diagnose MRSA infection nor to guide or monitor treatment for MRSA infections. Performed at [facility], [place] [address], [place], [place] [address]     I have reviewed the micro and lab results.  Imaging: IR REMOVAL TUN ACCESS W/ PORT W/O FL MOD SED  Result Date: 01/19/2020 INDICATION: 69 year old male with a history of un known fever origin referred for port removal EXAM: IMPLANTED PORT A CATH PLACEMENT WITH ULTRASOUND AND FLUOROSCOPIC GUIDANCE MEDICATIONS: 1 g vancomycin; The antibiotic was administered within an appropriate time interval prior to skin puncture. ANESTHESIA/SEDATION: Moderate (conscious) sedation was employed during this procedure. A total of Versed 1.0 mg and Fentanyl 50 mcg was administered intravenously. Moderate Sedation Time: 10 minutes. The patient's level of consciousness and vital signs were monitored continuously by radiology nursing throughout the procedure under my direct supervision.  FLUOROSCOPY TIME:  None COMPLICATIONS: None PROCEDURE: Informed consent was obtained from the patient following an explanation of the procedure, risks, benefits and  alternatives. The patient understands, agrees and consents for the procedure. All questions were addressed. A time out was performed prior to the initiation of the procedure. The patient was positioned in the operation suite in the supine position on a gantry. The previous scar on the right chest was generously infiltrated with 1% lidocaine for local anesthesia. Infiltration of the skin and subcutaneous tissues surrounding the port was performed. Using sharp and blunt dissection, the port apparatus and subcutaneous catheter were removed in their entirety. There was no erythema or purulent fluid. Given the appearance, primary closure was elected. The port pocket was then closed with interrupted Vicryl layer and a running subcuticular with 4-0 Monocryl. The skin was sealed with Derma bond. A sterile dressing was placed. The patient tolerated the procedure well and remained hemodynamically stable throughout. No complications were encountered and no significant blood loss was encountered. IMPRESSION: Status post bedside right IJ port catheter removal. Signed, Dulcy Fanny. Dellia Nims, RPVI [facility] Radiology Electronically Signed   By: Corrie Mckusick D.O.   On: 01/19/2020 15:47       [facility] [phone] pager 01/20/2020, 8:41 AM

## 2020-01-21 DIAGNOSIS — E1052 Type 1 diabetes mellitus with diabetic peripheral angiopathy with gangrene: Secondary | ICD-10-CM

## 2020-01-21 DIAGNOSIS — J189 Pneumonia, unspecified organism: Secondary | ICD-10-CM

## 2020-01-21 DIAGNOSIS — N172 Acute kidney failure with medullary necrosis: Secondary | ICD-10-CM

## 2020-01-21 DIAGNOSIS — A403 Sepsis due to Streptococcus pneumoniae: Secondary | ICD-10-CM

## 2020-01-21 DIAGNOSIS — R6521 Severe sepsis with septic shock: Secondary | ICD-10-CM

## 2020-01-21 DIAGNOSIS — C8330 Diffuse large B-cell lymphoma, unspecified site: Secondary | ICD-10-CM

## 2020-01-21 LAB — COMPREHENSIVE METABOLIC PANEL
ALT: 31 U/L (ref 0–44)
AST: 42 U/L — ABNORMAL HIGH (ref 15–41)
Albumin: 2.5 g/dL — ABNORMAL LOW (ref 3.5–5.0)
Alkaline Phosphatase: 55 U/L (ref 38–126)
Anion gap: 8 (ref 5–15)
BUN: 11 mg/dL (ref 8–23)
CO2: 23 mmol/L (ref 22–32)
Calcium: 8.4 mg/dL — ABNORMAL LOW (ref 8.9–10.3)
Chloride: 104 mmol/L (ref 98–111)
Creatinine, Ser: 0.99 mg/dL (ref 0.61–1.24)
GFR calc Af Amer: >60 mL/min (ref >60)
GFR calc non Af Amer: >60 mL/min (ref >60)
Glucose, Bld: 150 mg/dL — ABNORMAL HIGH (ref 70–99)
Potassium: 3.8 mmol/L (ref 3.5–5.1)
Sodium: 135 mmol/L (ref 135–145)
Total Bilirubin: 0.3 mg/dL (ref 0.3–1.2)
Total Protein: 5.4 g/dL — ABNORMAL LOW (ref 6.5–8.1)

## 2020-01-21 LAB — CBC
HCT: 25.5 % — ABNORMAL LOW (ref 39.0–52.0)
Hemoglobin: 8.3 g/dL — ABNORMAL LOW (ref 13.0–17.0)
MCH: 30.3 pg (ref 26.0–34.0)
MCHC: 32.5 g/dL (ref 30.0–36.0)
MCV: 93.1 fL (ref 80.0–100.0)
Platelets: 457 10*3/uL — ABNORMAL HIGH (ref 150–400)
RBC: 2.74 MIL/uL — ABNORMAL LOW (ref 4.22–5.81)
RDW: 17.2 % — ABNORMAL HIGH (ref 11.5–15.5)
WBC: 9.1 10*3/uL (ref 4.0–10.5)
nRBC: 0 % (ref 0.0–0.2)

## 2020-01-21 LAB — CULTURE, BLOOD (ROUTINE X 2)
Culture: NO GROWTH
Culture: NO GROWTH
Special Requests: ADEQUATE

## 2020-01-21 LAB — GLUCOSE, CAPILLARY
Glucose-Capillary: 119 mg/dL — ABNORMAL HIGH (ref 70–99)
Glucose-Capillary: 139 mg/dL — ABNORMAL HIGH (ref 70–99)
Glucose-Capillary: 120 mg/dL — ABNORMAL HIGH (ref 70–99)
Glucose-Capillary: 164 mg/dL — ABNORMAL HIGH (ref 70–99)

## 2020-01-21 MED ORDER — AMLODIPINE BESYLATE 5 MG PO TABS: 5.0000 mg | ORAL_TABLET | Freq: Every day | ORAL | 11 refills | Status: AC

## 2020-01-21 MED ORDER — BENZONATATE 100 MG PO CAPS: 100.0000 mg | ORAL_CAPSULE | Freq: Three times a day (TID) | ORAL | 0 refills | Status: AC

## 2020-01-21 MED ORDER — OLMESARTAN MEDOXOMIL-HCTZ 40-25 MG PO TABS: 1.0000 | ORAL_TABLET | Freq: Every day | ORAL | Status: AC

## 2020-01-21 MED ORDER — GUAIFENESIN-DM 100-10 MG/5ML PO SYRP: 5.0000 mL | ORAL_SOLUTION | ORAL | 0 refills | Status: DC | PRN

## 2020-01-21 MED ORDER — AMLODIPINE BESYLATE 2.5 MG PO TABS: 2.5000 mg | ORAL_TABLET | Freq: Every day | ORAL | Status: AC

## 2020-01-21 NOTE — Progress Notes (Signed)
Patient being discharged in stable condition. No complaints of pain or discomfort at this time.

## 2020-01-21 NOTE — Plan of Care (Signed)

## 2020-01-21 NOTE — Discharge Summary (Signed)
Physician Discharge Summary  Patrick Bautista WNI:[id number] DOB: 31-Jul-1950 DOA: 01/16/2020  PCP: [name], L.[name], MD  Admit date: 01/16/2020 Discharge date: 01/21/2020  Time spent: 50 minutes  Recommendations for Outpatient Follow-up:  1. Follow-up with Dr. Marin Olp hematology/oncology in 1 to 2 [place]. 2. Follow-up with [name], L.[name], MD in 1 to 2 [place].   Discharge Diagnoses:  Principal Problem:   Sepsis ([place]) Active Problems:   Diffuse large B cell lymphoma ([place])   Port-A-Cath in place   Pneumonia   Fever   Infection due to Port-A-Cath   Discharge Condition: Stable and improved  Diet recommendation: Carb modified  Filed Weights   01/16/20 1044 01/16/20 1351  Weight: 93.2 kg 93.2 kg    History of present illness:  HPI per Dr. Marzetta Bautista is a 69 y.o. male with medical history significant of B-cell lymphoma. Presented with increasing fatigue and fever. Reported symptoms for last week. Initially started with sinus drainage. He spoke with his physician and they recommended saline flushes. This didn't help. He started having fevers up to 103 degrees. He talked with his physician again and he was given a z-pack. He completed all but the last day of the z-pack. He went to this oncologist and was recommended ED follow up due to fever and low blood pressures. He denied any sick contacts. He denied any other alleviating or aggravating factors.   ED Course: Workup revealed fevers, elevated RR, and elevated HR. CT sinus obtained, as well as CXR. Both were unremarkable. COVID screen negative. Flu screen negative. EDP spoke with oncology. TR[facility] called for admission.    Hospital Course:  1 sepsis likely secondary to HCAP Patient was admitted with fevers, elevated respiratory rate, tachycardic.  Initial chest x-ray done was negative for any acute infiltrates however repeat chest x-ray which was done was concerning for possible pneumonia.  Patient was pancultured and started empirically on  IV vancomycin IV cefepime and IV Flagyl.  ID was consulted and IV Flagyl discontinued.  MRSA PCR was negative and as such vancomycin discontinued.  Cefepime subsequently discontinued 01/20/2020.  Patient improved clinically and had received a 5-day course of cefepime during this hospitalization.  Patient noted to be immunosuppressed from recent chemotherapy and history of lymphoma.  Patient did not have a leukocytosis or lactic acidosis however could likely be mass due to immunocompromise state.  Blood cultures which were done were negative.  IR was consulted and Port-A-Cath was removed on 01/19/2020 and tip sent for cultures.  Covid PCR as well as respiratory viral panel was negative.  CT of the sinuses were also clear.  Patient improved clinically will be discharged home in stable and improved condition.  2.  History of non-Hodgkin's lymphoma status post chemotherapy Patient was followed by oncology during the hospitalization.  Outpatient follow-up.  3.  Type 2 diabetes mellitus, insulin-dependent Patient maintained on Lantus throughout the hospitalization as well as sliding scale insulin.  Outpatient follow-up.  4.  Hypertension Patient noted on admission to be hypotensive and as such antihypertensive medications were held.  Patient's blood pressure improved and was normotensive by day of discharge.  Outpatient follow-up.  5.  Anemia Felt likely chemotherapy induced.  FOBT negative.  Patient was transfused a unit of packed red blood cells.  Hemoglobin stabilized and was 8.3 by day of discharge.  Outpatient follow-up.  Procedures:  CT maxillofacial 01/16/2020  Chest x-ray 01/16/2020, 01/17/2020  Right IJ port catheter removal per IR, Dr. Earleen [place] 01/19/2020  Ultrasound right chest wall 01/17/2020  Consultations:  Oncology: Dr. Marin Olp 01/18/2020  ID: [place] 01/17/2020  Interventional radiology: Dr. Annamaria Boots 01/18/2020  Discharge Exam: Vitals:   01/20/20 2009 01/21/20 0512  BP: 118/77 132/75   Pulse: 82 72  Resp: 18 18  Temp: 98.3 F (36.8 C) 98.8 F (37.1 C)  SpO2: 97% 96%    General: NAD Cardiovascular: RRR Respiratory: CTAB  Discharge Instructions   Discharge Instructions    Diet Carb Modified   Complete by: As directed    Increase activity slowly   Complete by: As directed      Allergies as of 01/21/2020      Reactions   Hydrocodone Nausea And Vomiting   Percocet [oxycodone-acetaminophen] Nausea And Vomiting   Codeine Itching   Penicillins Itching   Has patient had a PCN reaction causing immediate rash, facial/tongue/throat swelling, SOB or lightheadedness with hypotension: No Has patient had a PCN reaction causing severe rash involving mucus membranes or skin necrosis: No Has patient had a PCN reaction that required hospitalization No Has patient had a PCN reaction occurring within the last 10 years: No If all of the above answers are "NO", then may proceed with Cephalosporin use.   Tape Itching, Rash    Paper Tape causes blisters; please use Coban wrap if possible      Medication List    STOP taking these medications   ondansetron 8 MG tablet Commonly known as: Zofran   predniSONE 20 MG tablet Commonly known as: DELTASONE   traMADol 50 MG tablet Commonly known as: Ultram     TAKE these medications   acetaminophen 500 MG tablet Commonly known as: TYLENOL Take 500-1,000 mg by mouth every 6 (six) hours as needed for moderate pain.   amLODipine 2.5 MG tablet Commonly known as: NORVASC Take 1 tablet (2.5 mg total) by mouth daily. 2.5 mg + 5 mg =7.5 mg total daily dose Start taking on: January 22, 2020   amLODipine 5 MG tablet Commonly known as: NORVASC Take 1 tablet (5 mg total) by mouth daily. 2.5 mg + 5 mg =7.5 mg total daily dose Start taking on: January 22, 2020   B-D UF III MINI PEN NEEDLES 31G X 5 MM Misc Generic drug: Insulin Pen Needle Inject into the skin 4 (four) times daily.   benzonatate 100 MG capsule Commonly known  as: TESSALON Take 1 capsule (100 mg total) by mouth 3 (three) times daily for 5 days.   citalopram 10 MG tablet Commonly known as: CELEXA TAKE 1 TABLET BY MOUTH EVERY DAY   ergocalciferol 1.25 MG (50000 UT) capsule Commonly known as: VITAMIN D2 Take 1 capsule (50,000 Units total) by mouth 2 (two) times a week.   fluocinonide cream 0.05 % Commonly known as: LIDEX Apply 1 application topically 2 (two) times daily as needed (to access port).   guaiFENesin-dextromethorphan 100-10 MG/5ML syrup Commonly known as: ROBITUSSIN DM Take 5 mLs by mouth every 4 (four) hours as needed for cough.   HumaLOG KwikPen 100 UNIT/ML KwikPen Generic drug: insulin lispro Inject 1-5 Units into the skin See admin instructions. Sliding scale : up to 4 times a day.   lidocaine-prilocaine cream Commonly known as: EMLA Apply to affected area once   loratadine 10 MG tablet Commonly known as: CLARITIN Take 1 tablet (10 mg total) by mouth See admin instructions. Patient states he takes for 7 days: from 3rd day post chemo until 10th day post chemo What changed:   when to take this  additional instructions   LORazepam  0.5 MG tablet Commonly known as: ATIVAN TAKE 1 TABLET BY MOUTH EVERY 6 HOURS AS NEEDED (NAUSEA OR VOMITING). What changed: See the new instructions.   olmesartan-hydrochlorothiazide 40-25 MG tablet Commonly known as: BENICAR HCT Take 1 tablet by mouth daily. Start taking on: January 28, 2020 What changed: These instructions start on January 28, 2020. If you are unsure what to do until then, ask your doctor or other care provider.   OneTouch Verio test strip Generic drug: glucose blood 2 (two) times daily.   pantoprazole 40 MG tablet Commonly known as: PROTONIX TAKE 1 TABLET BY MOUTH DAILY BEFORE BREAKFAST What changed: See the new instructions.   prochlorperazine 10 MG tablet Commonly known as: COMPAZINE Take 1 tablet (10 mg total) by mouth every 6 (six) hours as needed (Nausea or  vomiting).   senna-docusate 8.6-50 MG tablet Commonly known as: Senna S Take 2 tablets by mouth at bedtime as needed for mild constipation or moderate constipation.   Tyler Aas FlexTouch 100 UNIT/ML FlexTouch Pen Generic drug: insulin degludec Inject 40 Units into the skin daily.      Allergies  Allergen Reactions  . Hydrocodone Nausea And Vomiting  . Percocet [Oxycodone-Acetaminophen] Nausea And Vomiting  . Codeine Itching  . Penicillins Itching    Has patient had a PCN reaction causing immediate rash, facial/tongue/throat swelling, SOB or lightheadedness with hypotension: No Has patient had a PCN reaction causing severe rash involving mucus membranes or skin necrosis: No Has patient had a PCN reaction that required hospitalization No Has patient had a PCN reaction occurring within the last 10 years: No If all of the above answers are "NO", then may proceed with Cephalosporin use.   . Tape Itching and Rash     Paper Tape causes blisters; please use Coban wrap if possible    Follow-up Information    [name], L.[name], MD. Schedule an appointment as soon as possible for a visit in 2 week(s).   Specialty: Family Medicine Why: f/u in 1-2 [place]. Contact information: [address] [place] [address] [phone]        Volanda Napoleon, MD. Schedule an appointment as soon as possible for a visit in 1 week(s).   Specialty: Oncology Why: f/u in 1=2 [place]. Contact information: [place] [address] [phone]                The results of significant diagnostics from this hospitalization (including imaging, microbiology, ancillary and laboratory) are listed below for reference.    Significant Diagnostic Studies: DG Chest 2 View  Result Date: 01/16/2020 CLINICAL DATA:  Lymphoma with cough and recurrent fever EXAM: CHEST - 2 VIEW COMPARISON:  Chest CT 08/26/2019 FINDINGS: Normal heart size and mediastinal contours.  Porta catheter on the right with tip at the SVC. There is no edema, consolidation, effusion, or pneumothorax. Spondylosis. IMPRESSION: No evidence of acute disease. Electronically Signed   By: Monte Fantasia M.D.   On: 01/16/2020 10:17   IR REMOVAL TUN ACCESS W/ PORT W/O FL MOD SED  Result Date: 01/19/2020 INDICATION: 69 year old male with a history of un known fever origin referred for port removal EXAM: IMPLANTED PORT A CATH PLACEMENT WITH ULTRASOUND AND FLUOROSCOPIC GUIDANCE MEDICATIONS: 1 g vancomycin; The antibiotic was administered within an appropriate time interval prior to skin puncture. ANESTHESIA/SEDATION: Moderate (conscious) sedation was employed during this procedure. A total of Versed 1.0 mg and Fentanyl 50 mcg was administered intravenously. Moderate Sedation Time: 10  minutes. The patient's level of consciousness and vital signs were monitored continuously by radiology nursing throughout the procedure under my direct supervision. FLUOROSCOPY TIME:  None COMPLICATIONS: None PROCEDURE: Informed consent was obtained from the patient following an explanation of the procedure, risks, benefits and alternatives. The patient understands, agrees and consents for the procedure. All questions were addressed. A time out was performed prior to the initiation of the procedure. The patient was positioned in the operation suite in the supine position on a gantry. The previous scar on the right chest was generously infiltrated with 1% lidocaine for local anesthesia. Infiltration of the skin and subcutaneous tissues surrounding the port was performed. Using sharp and blunt dissection, the port apparatus and subcutaneous catheter were removed in their entirety. There was no erythema or purulent fluid. Given the appearance, primary closure was elected. The port pocket was then closed with interrupted Vicryl layer and a running subcuticular with 4-0 Monocryl. The skin was sealed with Derma bond. A sterile dressing  was placed. The patient tolerated the procedure well and remained hemodynamically stable throughout. No complications were encountered and no significant blood loss was encountered. IMPRESSION: Status post bedside right IJ port catheter removal. Signed, Dulcy Fanny. Dellia Nims, RPVI [facility] Electronically Signed   By: Corrie Mckusick D.O.   On: 01/19/2020 15:47   DG CHEST PORT 1 VIEW  Result Date: 01/17/2020 CLINICAL DATA:  Shortness of breath. EXAM: PORTABLE CHEST 1 VIEW COMPARISON:  January 16, 2020 FINDINGS: Stable position of the injectable port. Cardiomediastinal silhouette is normal. Mediastinal contours appear intact. Subtle peribronchial airspace opacities with lower lobe predominance, left greater than right. Osseous structures are without acute abnormality. Soft tissues are grossly normal. IMPRESSION: Subtle peribronchial airspace opacities with lower lobe predominance, left greater than right may represent early or atypical pneumonia. Electronically Signed   By: Fidela Salisbury M.D.   On: 01/17/2020 20:12   DG Fluoro Guide Spinal/SI Jt Inj Left  Result Date: 12/26/2019 Etheleen Mayhew, MD     12/26/2019  2:56 PM Lumbar puncture performed at L3-L4 for intrathecal chemotherapy infusion.  12 mg methotrexate administered intrathecally.  No complications.  Please see dictation in PACS for additional details.   [place] CHEST SOFT TISSUE  Result Date: 01/17/2020 CLINICAL DATA:  Infection due to Port-A-Cath. EXAM: ULTRASOUND OF HEAD/NECK SOFT TISSUES TECHNIQUE: Ultrasound examination of the head and neck soft tissues was performed in the area of clinical concern. COMPARISON:  None. FINDINGS: Targeted sonographic evaluation in the area of clinical concern in the right chest, area around the port through the nipple were scanned. No evidence of fluid collection adjacent to the port or catheter. No significant skin thickening or edema. IMPRESSION:  No evidence of fluid collection in the right chest wall related to chest port. Electronically Signed   By: Keith Rake M.D.   On: 01/17/2020 18:21   CT Maxillofacial Wo Contrast  Result Date: 01/16/2020 CLINICAL DATA:  Syncope with nasal trauma. Sinus pressure, cough. History of B-cell lymphoma EXAM: CT MAXILLOFACIAL WITHOUT CONTRAST TECHNIQUE: Multidetector CT imaging of the maxillofacial structures was performed. Multiplanar CT image reconstructions were also generated. COMPARISON:  None. FINDINGS: Osseous: No acute maxillofacial bone fracture. Bony orbital walls intact. Mandible intact. Temporomandibular joints aligned without dislocation. Orbits: Negative. No traumatic or inflammatory finding. Sinuses: Paranasal sinuses are well aerated and clear. No mucosal thickening or air-fluid level. Pneumatization of the bilateral middle nasal turbinates. Bilateral mastoid air cells are clear. Soft tissues: No  soft tissue swelling. No masses identified. No lacrimal gland enlargement. Limited intracranial: No significant or unexpected finding. IMPRESSION: 1. Negative for acute maxillofacial bone fracture. 2. Paranasal sinuses are well aerated and clear. Electronically Signed   By: Davina Poke D.O.   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 EXAM: DIAGNOSTIC LUMBAR PUNCTURE UNDER FLUOROSCOPIC GUIDANCE INTRATHECAL CHEMOTHERAPY INJECTION FLUOROSCOPY TIME:  Fluoroscopy Time:  1 minutes and 24 seconds Radiation Exposure Index (if provided by the fluoroscopic device): 13.6 mGy PROCEDURE: Informed consent was obtained from the patient prior to the procedure, including potential complications of headache, allergy, and pain. With the patient prone, the lower back was prepped with Betadine. 1% Lidocaine was used for local anesthesia. Lumbar puncture was performed at the  L3-L4 level using a 20 gauge needle with return of clear CSF. No CSF was obtained for laboratory studies. 12 mg of methotrexate was injected intrathecally. The patient tolerated the procedure well and there were no apparent complications. IMPRESSION: 1. Successful uncomplicated fluoroscopic guided lumbar puncture for intrathecal chemotherapy injection, as above. Electronically Signed   By: Vinnie Langton M.D.   On: 12/26/2019 14:43    Microbiology: Recent Results (from the past 240 hour(s))  Respiratory Panel by RT PCR (Flu A&B, Covid) - Nasopharyngeal Swab     Status: None   Collection Time: 01/16/20 11:09 AM   Specimen: Nasopharyngeal Swab  Result Value Ref Range Status   SARS Coronavirus 2 by RT PCR NEGATIVE NEGATIVE Final    Comment: (NOTE) SARS-CoV-2 target nucleic acids are NOT DETECTED.  The SARS-CoV-2 RNA is generally detectable in upper respiratoy specimens during the acute phase of infection. The lowest concentration of SARS-CoV-2 viral copies this assay can detect is 131 copies/mL. A negative result does not preclude SARS-Cov-2 infection and should not be used as the sole basis for treatment or other patient management decisions. A negative result may occur with  improper specimen collection/handling, submission of specimen other than nasopharyngeal swab, presence of viral mutation(s) within the areas targeted by this assay, and inadequate number of viral copies (<131 copies/mL). A negative result must be combined with clinical observations, patient history, and epidemiological information. The expected result is Negative.  Fact Sheet for Patients:  PinkCheek.be  Fact Sheet for Healthcare Providers:  GravelBags.it  This test is no t yet approved or cleared by the [place] FDA and  has been authorized for detection and/or diagnosis of SARS-CoV-2 by FDA under an Emergency Use Authorization (EUA). This EUA will  remain  in effect (meaning this test can be used) for the duration of the COVID-19 declaration under Section 564(b)(1) of the Act, 21 U.S.C. section 360bbb-3(b)(1), unless the authorization is terminated or revoked sooner.     Influenza A by PCR NEGATIVE NEGATIVE Final   Influenza B by PCR NEGATIVE NEGATIVE Final    Comment: (NOTE) The Xpert Xpress SARS-CoV-2/FLU/RSV assay is intended as an aid in  the diagnosis of influenza from Nasopharyngeal swab specimens and  should not be used as a sole basis for treatment. Nasal washings and  aspirates are unacceptable for Xpert Xpress SARS-CoV-2/FLU/RSV  testing.  Fact Sheet for Patients: PinkCheek.be  Fact Sheet for Healthcare Providers: GravelBags.it  This test is not yet approved or cleared by the [place] FDA and  has been authorized for detection and/or diagnosis of SARS-CoV-2 by  FDA under an  Emergency Use Authorization (EUA). This EUA will remain  in effect (meaning this test can be used) for the duration of the  Covid-19 declaration under Section 564(b)(1) of the Act, 21  U.S.C. section 360bbb-3(b)(1), unless the authorization is  terminated or revoked. Performed at [facility], [place] [address], [place], [place] [address]   Blood Culture (routine x 2)     Status: None   Collection Time: 01/16/20 11:13 AM   Specimen: BLOOD  Result Value Ref Range Status   Specimen Description   Final    BLOOD LEFT WRIST Performed at [facility] [address] [place], [place] [address]    Special Requests   Final    BOTTLES DRAWN AEROBIC AND ANAEROBIC Blood Culture adequate volume Performed at [facility] [address], [place], [place] [address]    Culture   Final    NO GROWTH 5 DAYS Performed at [facility], [place] [address] [place], [place] [address]    Report Status 01/21/2020 FINAL  Final  Urine culture      Status: None   Collection Time: 01/16/20 11:21 AM   Specimen: In/Out Cath Urine  Result Value Ref Range Status   Specimen Description   Final    IN/OUT CATH URINE Performed at [facility] [address], [place], [place] [address]    Special Requests   Final    NONE Performed at [facility], [place] [address], [place], [place] [address]    Culture   Final    NO GROWTH Performed at [facility], [place] [address], [place], [place] [address]    Report Status 01/17/2020 FINAL  Final  Blood Culture (routine x 2)     Status: None   Collection Time: 01/16/20 11:35 AM   Specimen: BLOOD  Result Value Ref Range Status   Specimen Description   Final    BLOOD RIGHT ANTECUBITAL Performed at [facility] [address], [place], [place] [address]    Special Requests   Final    BOTTLES DRAWN AEROBIC AND ANAEROBIC Blood Culture results may not be optimal due to an excessive volume of blood received in culture bottles Performed at [facility] [address], [place], [place] [address]    Culture   Final    NO GROWTH 5 DAYS Performed at [facility], [place] [address] [place], [place] [address]    Report Status 01/21/2020 FINAL  Final  Respiratory Panel by PCR     Status: None   Collection Time: 01/16/20  4:06 PM   Specimen: Nasopharyngeal Swab; Respiratory  Result Value Ref Range Status   Adenovirus NOT DETECTED NOT DETECTED Final   Coronavirus 229E NOT DETECTED NOT DETECTED Final    Comment: (NOTE) The Coronavirus on the Respiratory Panel, DOES NOT test for the novel  Coronavirus (2019 nCoV)    Coronavirus HKU1 NOT DETECTED NOT DETECTED Final   Coronavirus NL63 NOT DETECTED NOT DETECTED Final   Coronavirus OC43 NOT DETECTED NOT DETECTED Final   Metapneumovirus NOT DETECTED NOT DETECTED Final   Rhinovirus / Enterovirus NOT DETECTED NOT DETECTED Final   Influenza A NOT DETECTED NOT DETECTED Final    Influenza B NOT DETECTED NOT DETECTED Final   Parainfluenza Virus 1 NOT DETECTED NOT DETECTED Final   Parainfluenza Virus 2 NOT DETECTED NOT DETECTED Final   Parainfluenza Virus 3 NOT DETECTED NOT DETECTED Final  Parainfluenza Virus 4 NOT DETECTED NOT DETECTED Final   Respiratory Syncytial Virus NOT DETECTED NOT DETECTED Final   Bordetella pertussis NOT DETECTED NOT DETECTED Final   Chlamydophila pneumoniae NOT DETECTED NOT DETECTED Final   Mycoplasma pneumoniae NOT DETECTED NOT DETECTED Final    Comment: Performed at [facility], [place] [address], [place], [place] [address]  Culture, blood (single)     Status: None (Preliminary result)   Collection Time: 01/17/20 10:41 AM   Specimen: BLOOD  Result Value Ref Range Status   Specimen Description   Final    BLOOD LEFT ANTECUBITAL Performed at [facility] [address] [place], [place] [address]    Special Requests   Final    BOTTLES DRAWN AEROBIC AND ANAEROBIC Blood Culture adequate volume Performed at [facility] [address], [place], [place] [address]    Culture   Final    NO GROWTH 4 DAYS Performed at [facility], [place] [address], [place], [place] [address]    Report Status PENDING  Incomplete  Culture, blood (routine x 2)     Status: None (Preliminary result)   Collection Time: 01/17/20  2:56 PM   Specimen: BLOOD  Result Value Ref Range Status   Specimen Description   Final    BLOOD PORTA CATH Performed at [facility] [address], [place], [place] [address]    Special Requests   Final    BOTTLES DRAWN AEROBIC AND ANAEROBIC Blood Culture adequate volume Performed at [facility] [address] [place], [place] [address]    Culture   Final    NO GROWTH 4 DAYS Performed at [facility], [place] [address] [place], [place] [address]    Report Status PENDING  Incomplete  MRSA PCR Screening     Status: None    Collection Time: 01/19/20  9:36 AM   Specimen: Nasal Mucosa; Nasopharyngeal  Result Value Ref Range Status   MRSA by PCR NEGATIVE NEGATIVE Final    Comment:        The GeneXpert MRSA Assay (FDA approved for NASAL specimens only), is one component of a comprehensive MRSA colonization surveillance program. It is not intended to diagnose MRSA infection nor to guide or monitor treatment for MRSA infections. Performed at [facility], [place] [address], [place], [place] [address]   Cath Tip Culture     Status: None (Preliminary result)   Collection Time: 01/19/20  3:00 PM   Specimen: Catheter Tip; Other  Result Value Ref Range Status   Specimen Description   Final    CATH TIP Performed at [facility] [address], [place], [place] [address]    Special Requests   Final    NONE Performed at [facility], [place] [address] [place],  [address]    Culture   Final    NO GROWTH 2 DAYS Performed at [facility] [address] [place],  [address]    Report Status PENDING  Incomplete     Labs: Basic Metabolic Panel: Recent Labs  Lab 01/16/20 1108 01/18/20 0341 01/19/20 0630 01/20/20 0524 01/21/20 0437  NA 135 138 138 133* 135  K 3.5 3.7 3.9 3.6 3.8  CL 98 105 107 104 104  CO2 24 23 24 23 23   GLUCOSE 258* 96 93 160* 150*  BUN 18 14 13 15 11   CREATININE 1.38* 1.25* 1.20 1.04 0.99  CALCIUM 9.4 8.3* 8.5* 8.3* 8.4*   Liver Function Tests: Recent Labs  Lab 01/16/20 1108 01/18/20 0341 01/19/20 0630 01/20/20 0524 01/21/20 0437  AST 20 18 23  41 42*  ALT 20 18 17 27 31   ALKPHOS 88 62 58 61 55  BILITOT 0.1* 0.3 0.4 0.4 0.3  PROT 6.7 5.4* 5.6* 5.6* 5.4*  ALBUMIN 3.4* 2.5* 2.6* 2.6* 2.5*   No results for input(s): LIPASE, AMYLASE in the last 168 hours. No results for input(s): AMMONIA in the last 168 hours. CBC: Recent Labs  Lab 01/16/20 1108 01/16/20 1108 01/18/20 0341 01/18/20 2327  01/19/20 0630 01/20/20 0524 01/21/20 0437  WBC 11.6*   < > 10.6* 11.3* 9.3 8.5 9.1  NEUTROABS 9.0*  --   --  9.3*  --   --   --   HGB 8.1*   < > 6.3* 8.4* 8.7* 8.4* 8.3*  HCT 23.7*   < > 18.9* 25.5* 25.5* 26.0* 25.5*  MCV 94.0   < > 95.0 90.7 91.4 93.2 93.1  PLT 271   < > 294 335 349 426* 457*   < > = values in this interval not displayed.   Cardiac Enzymes: No results for input(s): CKTOTAL, CKMB, CKMBINDEX, TROPONINI in the last 168 hours. BNP: BNP (last 3 results) No results for input(s): BNP in the last 8760 hours.  ProBNP (last 3 results) No results for input(s): PROBNP in the last 8760 hours.  CBG: Recent Labs  Lab 01/19/20 1140 01/19/20 1704 01/19/20 2110 01/20/20 0720 01/20/20 1122  GLUCAP 80 97 192* 139* 187*       Signed:  Irine Seal MD.  [facility] 01/21/2020, 11:36 AM

## 2020-01-21 NOTE — Progress Notes (Signed)
He did not have any temperatures yesterday.  He feels well this morning.  He is ready to go home.  He is off antibiotics.  All cultures have been negative.  He was seen up in a chair yesterday.  There is no nausea or vomiting.  He has had no diarrhea.  He has had no bleeding.  His labs show white cell count 9.1.  Hemoglobin 8.3.  Platelet count 457,000.  His BUN is 11 creatinine 0.99.  His albumin is 2.5.  He has had no rashes.  He still has a bit of a cough.  It is nonproductive.  His vital signs are temperature 98.8.  Pulse 72.  Blood pressure 132/75.  His lungs are clear bilaterally.  Cardiac exam regular rate and rhythm.  Abdomen is soft.  He has good bowel sounds.  There is no fluid wave.  There is no palpable liver or spleen tip.  Skin exam shows no rashes, ecchymoses or petechia.  Neurological exam is nonfocal.  He does not need his cardiac monitor.  Again, I think he is able to go home at this point.  We will follow up in the office in a couple [place].  He will need radiation therapy to the left testicle.  I would not start this probably until late October.  Again, he is anemic.  I think if I need some folic acid and some iron.  I appreciate the great care that he is received from all the staff up on [address].  He is very appreciative of their care of him.  Lattie Haw, MD  [facility] 6:10

## 2020-01-22 LAB — CULTURE, BLOOD (SINGLE)
Culture: NO GROWTH
Special Requests: ADEQUATE

## 2020-01-22 LAB — CULTURE, BLOOD (ROUTINE X 2)
Culture: NO GROWTH
Special Requests: ADEQUATE

## 2020-01-22 MED FILL — Dexamethasone Sodium Phosphate Inj 100 MG/10ML: INTRAMUSCULAR | Qty: 1 | Status: AC

## 2020-01-22 MED FILL — Fosaprepitant Dimeglumine For IV Infusion 150 MG (Base Eq): INTRAVENOUS | Qty: 5 | Status: AC

## 2020-01-23 ENCOUNTER — Telehealth: Payer: Self-pay

## 2020-01-23 ENCOUNTER — Inpatient Hospital Stay: Payer: Medicare Other

## 2020-01-23 ENCOUNTER — Inpatient Hospital Stay: Payer: Medicare Other | Admitting: Hematology

## 2020-01-23 LAB — CATH TIP CULTURE: Culture: NO GROWTH

## 2020-01-23 NOTE — Telephone Encounter (Signed)
TCT patient regarding his missed lab appt. this morning and other appts., he verbalized that Dr. Marin Olp is going to be his new oncologist and that he would not be coming to [facility] anymore due to [facility] being closer to his home. Dr. Irene Limbo confirmed this information.   Cancelled other upcoming appts.

## 2020-01-30 ENCOUNTER — Other Ambulatory Visit: Payer: Self-pay | Admitting: Hematology

## 2020-01-30 DIAGNOSIS — C8339 Diffuse large B-cell lymphoma, extranodal and solid organ sites: Secondary | ICD-10-CM

## 2020-02-05 DIAGNOSIS — E1052 Type 1 diabetes mellitus with diabetic peripheral angiopathy with gangrene: Secondary | ICD-10-CM

## 2020-02-20 ENCOUNTER — Encounter: Payer: Self-pay | Admitting: Hematology & Oncology

## 2020-02-20 ENCOUNTER — Other Ambulatory Visit: Payer: Self-pay

## 2020-02-20 ENCOUNTER — Telehealth: Payer: Self-pay | Admitting: Hematology & Oncology

## 2020-02-20 ENCOUNTER — Inpatient Hospital Stay: Payer: Medicare Other | Attending: Hematology

## 2020-02-20 ENCOUNTER — Inpatient Hospital Stay (HOSPITAL_BASED_OUTPATIENT_CLINIC_OR_DEPARTMENT_OTHER): Payer: Medicare Other | Admitting: Hematology & Oncology

## 2020-02-20 VITALS — BP 105/68 | HR 95 | Temp 99.1°F | Resp 18 | Ht 70.0 in | Wt 205.0 lb

## 2020-02-20 DIAGNOSIS — C8332 Diffuse large B-cell lymphoma, intrathoracic lymph nodes: Secondary | ICD-10-CM | POA: Diagnosis not present

## 2020-02-20 DIAGNOSIS — Z880 Allergy status to penicillin: Secondary | ICD-10-CM | POA: Diagnosis not present

## 2020-02-20 DIAGNOSIS — Z885 Allergy status to narcotic agent status: Secondary | ICD-10-CM | POA: Insufficient documentation

## 2020-02-20 DIAGNOSIS — Z888 Allergy status to other drugs, medicaments and biological substances status: Secondary | ICD-10-CM | POA: Diagnosis not present

## 2020-02-20 DIAGNOSIS — C8339 Diffuse large B-cell lymphoma, extranodal and solid organ sites: Secondary | ICD-10-CM | POA: Insufficient documentation

## 2020-02-20 DIAGNOSIS — Z79899 Other long term (current) drug therapy: Secondary | ICD-10-CM | POA: Diagnosis not present

## 2020-02-20 LAB — CBC WITH DIFFERENTIAL (CANCER CENTER ONLY)
Abs Immature Granulocytes: 0.02 10*3/uL (ref 0.00–0.07)
Basophils Absolute: 0 10*3/uL (ref 0.0–0.1)
Basophils Relative: 1 %
Eosinophils Absolute: 0.5 10*3/uL (ref 0.0–0.5)
Eosinophils Relative: 7 %
HCT: 36.9 % — ABNORMAL LOW (ref 39.0–52.0)
Hemoglobin: 11.9 g/dL — ABNORMAL LOW (ref 13.0–17.0)
Immature Granulocytes: 0 %
Lymphocytes Relative: 24 %
Lymphs Abs: 2 10*3/uL (ref 0.7–4.0)
MCH: 30.1 pg (ref 26.0–34.0)
MCHC: 32.2 g/dL (ref 30.0–36.0)
MCV: 93.2 fL (ref 80.0–100.0)
Monocytes Absolute: 1 10*3/uL (ref 0.1–1.0)
Monocytes Relative: 12 %
Neutro Abs: 4.6 10*3/uL (ref 1.7–7.7)
Neutrophils Relative %: 56 %
Platelet Count: 277 10*3/uL (ref 150–400)
RBC: 3.96 MIL/uL — ABNORMAL LOW (ref 4.22–5.81)
RDW: 15.1 % (ref 11.5–15.5)
WBC Count: 8.1 10*3/uL (ref 4.0–10.5)
nRBC: 0 % (ref 0.0–0.2)

## 2020-02-20 LAB — CMP (CANCER CENTER ONLY)
ALT: 25 U/L (ref 0–44)
AST: 25 U/L (ref 15–41)
Albumin: 4.4 g/dL (ref 3.5–5.0)
Alkaline Phosphatase: 77 U/L (ref 38–126)
Anion gap: 8 (ref 5–15)
BUN: 25 mg/dL — ABNORMAL HIGH (ref 8–23)
CO2: 28 mmol/L (ref 22–32)
Calcium: 10.6 mg/dL — ABNORMAL HIGH (ref 8.9–10.3)
Chloride: 103 mmol/L (ref 98–111)
Creatinine: 1.33 mg/dL — ABNORMAL HIGH (ref 0.61–1.24)
GFR, Estimated: 58 mL/min — ABNORMAL LOW (ref >60)
Glucose, Bld: 252 mg/dL — ABNORMAL HIGH (ref 70–99)
Potassium: 4.6 mmol/L (ref 3.5–5.1)
Sodium: 139 mmol/L (ref 135–145)
Total Bilirubin: 0.4 mg/dL (ref 0.3–1.2)
Total Protein: 7.1 g/dL (ref 6.5–8.1)

## 2020-02-20 LAB — LACTATE DEHYDROGENASE: LDH: 144 U/L (ref 98–192)

## 2020-02-20 NOTE — Telephone Encounter (Signed)
Appointments scheduled calendar printed & mailed per 10/29 los 

## 2020-02-20 NOTE — Progress Notes (Signed)
Hematology and Oncology Follow Up Visit  Patrick Bautista [id number] April 23, 1951 69 y.o. 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 Patrick Bautista is back for his first office visit to the [place].  He is a very nice 69 year old white male.  He had a large cell lymphoma of the left testicle.  He was treated with systemic chemotherapy with R-CHOP.  He had 5 cycles of chemotherapy.  Last cycle was given in September.  He had toxicity after the fifth.  He was hospitalized.  He had what appeared to be some kind of sepsis.  He had decreased blood counts.  He recovered.  His Port-A-Cath was taken out.  He is doing better.  He is feeling much better.  He has more energy.  He is walking daily.  He is having no problems with fever.  Has had no cough or shortness of breath.  He has had no nausea or vomiting.  He is going to the bathroom okay.  He has had no problems with leg swelling.  There is been no rashes.  He has yet to have the radiation therapy to the left testicle.  We will have to get him set up for this.  I would have to say that overall, his performance status is ECOG 0 right now.  Medications:  Current Outpatient Medications:  .  acetaminophen (TYLENOL) 500 MG tablet, Take 500-1,000 mg by mouth every 6 (six) hours as needed for moderate pain. , Disp: , Rfl:  .  amLODipine (NORVASC) 2.5 MG tablet, Take 1 tablet (2.5 mg total) by mouth daily. 2.5 mg + 5 mg =7.5 mg total daily dose, Disp: , Rfl:  .  amLODipine (NORVASC) 5 MG tablet, Take 1 tablet (5 mg total) by mouth daily. 2.5 mg + 5 mg =7.5 mg total daily dose, Disp: , Rfl: 11 .  B-D UF III MINI PEN NEEDLES 31G X 5 MM MISC, Inject into the skin 4 (four) times daily., Disp: , Rfl:  .  citalopram (CELEXA) 10 MG tablet, TAKE 1 TABLET BY  MOUTH EVERY DAY (Patient not taking: Reported on 01/16/2020), Disp: 90 tablet, Rfl: 1 .  ergocalciferol (VITAMIN D2) 1.25 MG (50000 UT) capsule, Take 1 capsule (50,000 Units total) by mouth 2 (two) times a week., Disp: 24 capsule, Rfl: 2 .  fluocinonide cream (LIDEX) 9.44 %, Apply 1 application topically 2 (two) times daily as needed (to access port). , Disp: , Rfl:  .  guaiFENesin-dextromethorphan (ROBITUSSIN DM) 100-10 MG/5ML syrup, Take 5 mLs by mouth every 4 (four) hours as needed for cough., Disp: 118 mL, Rfl: 0 .  HUMALOG KWIKPEN 100 UNIT/ML KwikPen, Inject 1-5 Units into the skin See admin instructions. Sliding scale : up to 4 times a day., Disp: , Rfl:  .  lidocaine-prilocaine (EMLA) cream, Apply to affected area once (Patient not taking: Reported on 01/16/2020), Disp: 30 g, Rfl: 3 .  loratadine (CLARITIN) 10 MG tablet, Take 1 tablet (10 mg total) by mouth See admin instructions. Patient states he takes for 7 days: from 3rd day post chemo until 10th day post chemo (Patient taking differently: Take 10 mg by mouth daily. ), Disp: 28 tablet, Rfl: 0 .  LORazepam (ATIVAN) 0.5 MG tablet, TAKE 1 TABLET BY  MOUTH EVERY 6 HOURS AS NEEDED (NAUSEA OR VOMITING). (Patient taking differently: Take 0.5 mg by mouth every 6 (six) hours as needed for anxiety. ), Disp: 30 tablet, Rfl: 0 .  olmesartan-hydrochlorothiazide (BENICAR HCT) 40-25 MG tablet, Take 1 tablet by mouth daily., Disp: , Rfl:  .  ONETOUCH VERIO test strip, 2 (two) times daily., Disp: , Rfl:  .  pantoprazole (PROTONIX) 40 MG tablet, TAKE 1 TABLET BY MOUTH DAILY BEFORE BREAKFAST (Patient taking differently: Take 40 mg by mouth daily. ), Disp: 90 tablet, Rfl: 1 .  prochlorperazine (COMPAZINE) 10 MG tablet, Take 1 tablet (10 mg total) by mouth every 6 (six) hours as needed (Nausea or vomiting). (Patient not taking: Reported on 01/16/2020), Disp: 30 tablet, Rfl: 6 .  senna-docusate (SENNA S) 8.6-50 MG tablet, Take 2 tablets by mouth at bedtime as needed  for mild constipation or moderate constipation., Disp: 60 tablet, Rfl: 1 .  TRESIBA FLEXTOUCH 100 UNIT/ML FlexTouch Pen, Inject 40 Units into the skin daily. , Disp: , Rfl:   Allergies:  Allergies  Allergen Reactions  . Hydrocodone Nausea And Vomiting  . Percocet [Oxycodone-Acetaminophen] Nausea And Vomiting  . Codeine Itching  . Penicillins Itching    Has patient had a PCN reaction causing immediate rash, facial/tongue/throat swelling, SOB or lightheadedness with hypotension: No Has patient had a PCN reaction causing severe rash involving mucus membranes or skin necrosis: No Has patient had a PCN reaction that required hospitalization No Has patient had a PCN reaction occurring within the last 10 years: No If all of the above answers are "NO", then may proceed with Cephalosporin use.   . Tape Itching and Rash     Paper Tape causes blisters; please use Coban wrap if possible    Past Medical History, Surgical history, Social history, and Family History were reviewed and updated.  Review of Systems: Review of Systems  Constitutional: Negative.   HENT:  Negative.   Eyes: Negative.   Respiratory: Negative.   Cardiovascular: Negative.   Gastrointestinal: Negative.   Endocrine: Negative.   Genitourinary: Negative.    Musculoskeletal: Negative.   Skin: Negative.   Neurological: Negative.   Hematological: Negative.   Psychiatric/Behavioral: Negative.     Physical Exam:  oral temperature is 99.1 F (37.3 C). His blood pressure is 105/68 and his pulse is 95. His respiration is 18 and oxygen saturation is 100%.   Wt Readings from Last 3 Encounters:  01/16/20 205 lb 6.4 oz (93.2 kg)  01/16/20 205 lb 6.4 oz (93.2 kg)  01/02/20 206 lb 4.8 oz (93.6 kg)    Physical Exam Vitals reviewed.  HENT:     Head: Normocephalic and atraumatic.  Eyes:     Pupils: Pupils are equal, round, and reactive to light.  Cardiovascular:     Rate and Rhythm: Normal rate and regular rhythm.      Heart sounds: Normal heart sounds.  Pulmonary:     Effort: Pulmonary effort is normal.     Breath sounds: Normal breath sounds.  Abdominal:     General: Bowel sounds are normal.     Palpations: Abdomen is soft.  Musculoskeletal:        General: No tenderness or deformity. Normal range of motion.     Cervical back: Normal range of motion.  Lymphadenopathy:     Cervical: No cervical adenopathy.  Skin:    General: Skin is warm and dry.     Findings: No erythema or rash.  Neurological:  Mental Status: He is alert and oriented to person, place, and time.  Psychiatric:        Behavior: Behavior normal.        Thought Content: Thought content normal.        Judgment: Judgment normal.      Lab Results  Component Value Date   WBC 8.1 02/20/2020   HGB 11.9 (L) 02/20/2020   HCT 36.9 (L) 02/20/2020   MCV 93.2 02/20/2020   PLT 277 02/20/2020     Chemistry      Component Value Date/Time   NA 139 02/20/2020 1107   K 4.6 02/20/2020 1107   CL 103 02/20/2020 1107   CO2 28 02/20/2020 1107   BUN 25 (H) 02/20/2020 1107   CREATININE 1.33 (H) 02/20/2020 1107      Component Value Date/Time   CALCIUM 10.6 (H) 02/20/2020 1107   ALKPHOS 77 02/20/2020 1107   AST 25 02/20/2020 1107   ALT 25 02/20/2020 1107   BILITOT 0.4 02/20/2020 1107      Impression and Plan: Mr. Griep is a very nice 69 year old white male.  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  He had this removed in May.  He then underwent systemic chemotherapy along with intrathecal methotrexate.  He did not want to have any further chemotherapy after the fifth cycle because of the toxicity.  I still I do feel that he is going to be in good shape.  I think the risk of recurrence is going to be quite low.  He will need radiation therapy to the scrotal area.  We will see about getting him set up for this.  He also needs to have another PET scan done.  We will set him up with a PET scan since this will be is  surveillance scan after treatment.  I am just happy that he has recovered so well.  He really has done nicely since being in the hospital.  He has a strong faith.  We had good fellowship.  I would like to get him back to see him after Thanksgiving.   Volanda Napoleon, MD 10/29/202112:11 PM

## 2020-03-09 ENCOUNTER — Other Ambulatory Visit: Payer: Self-pay

## 2020-03-09 ENCOUNTER — Encounter (HOSPITAL_COMMUNITY)
Admission: RE | Admit: 2020-03-09 | Discharge: 2020-03-09 | Disposition: A | Discharge status: Home / Self Care | Payer: Medicare Other | Source: Ambulatory Visit | Attending: Hematology & Oncology | Admitting: Hematology & Oncology

## 2020-03-09 DIAGNOSIS — K5730 Diverticulosis of large intestine without perforation or abscess without bleeding: Secondary | ICD-10-CM | POA: Diagnosis not present

## 2020-03-09 DIAGNOSIS — I700 Atherosclerosis of aorta: Secondary | ICD-10-CM | POA: Diagnosis not present

## 2020-03-09 DIAGNOSIS — I2510 Atherosclerotic heart disease of native coronary artery without angina pectoris: Secondary | ICD-10-CM | POA: Diagnosis not present

## 2020-03-09 DIAGNOSIS — C8332 Diffuse large B-cell lymphoma, intrathoracic lymph nodes: Secondary | ICD-10-CM | POA: Diagnosis not present

## 2020-03-09 LAB — GLUCOSE, CAPILLARY: Glucose-Capillary: 132 mg/dL — ABNORMAL HIGH (ref 70–99)

## 2020-03-09 MED ORDER — FLUDEOXYGLUCOSE F - 18 (FDG) INJECTION
10.1000 | Freq: Once | INTRAVENOUS | Status: AC | PRN
  Administered 2020-03-09: 10.1 via INTRAVENOUS

## 2020-03-10 ENCOUNTER — Encounter: Payer: Self-pay | Admitting: Radiation Oncology

## 2020-03-10 ENCOUNTER — Ambulatory Visit
Admission: RE | Admit: 2020-03-10 | Discharge: 2020-03-10 | Disposition: A | Discharge status: Home / Self Care | Payer: Medicare Other | Source: Ambulatory Visit | Attending: Radiation Oncology | Admitting: Radiation Oncology

## 2020-03-10 DIAGNOSIS — Z79899 Other long term (current) drug therapy: Secondary | ICD-10-CM | POA: Insufficient documentation

## 2020-03-10 DIAGNOSIS — M5126 Other intervertebral disc displacement, lumbar region: Secondary | ICD-10-CM | POA: Diagnosis not present

## 2020-03-10 DIAGNOSIS — C8589 Other specified types of non-Hodgkin lymphoma, extranodal and solid organ sites: Secondary | ICD-10-CM | POA: Insufficient documentation

## 2020-03-10 DIAGNOSIS — Z8719 Personal history of other diseases of the digestive system: Secondary | ICD-10-CM | POA: Diagnosis not present

## 2020-03-10 DIAGNOSIS — I2510 Atherosclerotic heart disease of native coronary artery without angina pectoris: Secondary | ICD-10-CM | POA: Insufficient documentation

## 2020-03-10 DIAGNOSIS — Z87442 Personal history of urinary calculi: Secondary | ICD-10-CM | POA: Diagnosis not present

## 2020-03-10 DIAGNOSIS — I10 Essential (primary) hypertension: Secondary | ICD-10-CM | POA: Diagnosis not present

## 2020-03-10 DIAGNOSIS — E119 Type 2 diabetes mellitus without complications: Secondary | ICD-10-CM | POA: Insufficient documentation

## 2020-03-10 DIAGNOSIS — K5730 Diverticulosis of large intestine without perforation or abscess without bleeding: Secondary | ICD-10-CM | POA: Diagnosis not present

## 2020-03-10 DIAGNOSIS — C8330 Diffuse large B-cell lymphoma, unspecified site: Secondary | ICD-10-CM

## 2020-03-10 NOTE — Progress Notes (Signed)
Radiation Oncology         ([phone]) [phone] ________________________________  Initial Outpatient Consultation  Name: Patrick Bautista MRN: [id number]  Date: 03/10/2020  DOB: 09-23-1950  OA:[id number], L.[name], MD  Volanda Napoleon, MD   REFERRING PHYSICIAN: Volanda Napoleon, MD  DIAGNOSIS: The encounter diagnosis was Diffuse large B-cell lymphoma, unspecified body region [facility]).  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 male who is seen as a courtesy of Dr Marin Olp for an opinion concerning radiation therapy as part of management for his recently diagnosed testicular large B-cell lymphoma. Today, he is accompanied by no one. The patient began to experience sweating in his groin, left thigh numbness, and numbness of two fingers on the left hand following his second COVID-19 vaccine on 06/07/2019. The patient was seen by his PCP and was thought to have a hydrocele but continued to have symptoms. Thus, a testicular ultrasound was performed and revealed a left testicular mass. The patient then underwent a left radical orchiectomy on 09/03/2019 (Dr. Louis Meckel), which revealed diffuse large B-cell lymphoma.   PET scan on 09/30/2019 showed a small right adrenal nodule that exhibited intense tracer uptake with a max SUV of 13.03. Adrenal gland involvement could not be ruled out. However, biopsy was not recommended as it would not change the course of treatment. There were no FDG avid lymph nodes or mass identified within the remainder of the neck, chest, abdomen, or pelvis.  The patient completed five cycles of R-CHOP with G-CSF support and two cycles of IT MTX for CNS prophylaxis under the care of Dr. Irene Limbo.  The patient was first seen in consultation with Dr. Marin Olp on 01/18/2020 while admitted to the hospital for sepsis. He was last seen on 02/20/2020, during which time it was recommended that he proceed with radiation therapy to the scrotal  area.  Repeat PET scan on 03/09/2020 showed substantial improvement with reduction in the size of the axillary and pelvic lymph nodes. The focus of hypermetabolic activity along the right adrenal gland had resolved (may have been an adjacent lymph node).   PREVIOUS RADIATION THERAPY: No  PAST MEDICAL HISTORY:  Past Medical History:  Diagnosis Date  . Arthritis   . Diabetes mellitus without complication ([place])    type 2  . Diverticulitis 15 yrs ago  . History of kidney stones    right ureteral stone and one in 2017  . Hypertension   . 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 colonscopy  15 yrs ago  . CYSTOSCOPY/URETEROSCOPY/HOLMIUM LASER/STENT PLACEMENT Right 02/16/2017   Procedure: CYSTOSCOPY/RETROGRADE/URETEROSCOPY/HOLMIUM LASER/STENT PLACEMENT;  Surgeon: Ardis Hughs, MD;  Location: [facility];  Service: Urology;  Laterality: Right;  NEEDS 60 MIN FOR PROCEDURE  . IR IMAGING GUIDED PORT INSERTION  09/19/2019  . IR REMOVAL TUN ACCESS W/ PORT W/O FL MOD SED  01/19/2020  . MOUTH SURGERY  20 yrs ago  . ORCHIECTOMY Left 09/03/2019   Procedure: LEFT ORCHIECTOMY;  Surgeon: Ardis Hughs, MD;  Location: [facility];  Service: Urology;  Laterality: Left;    FAMILY HISTORY: No family history on file.  SOCIAL HISTORY:  Social History   Tobacco Use  . Smoking status: Never Smoker  . Smokeless tobacco: Never Used  Vaping Use  . Vaping Use: Never used  Substance Use Topics  . Alcohol use: No  .  Drug use: No    ALLERGIES:  Allergies  Allergen Reactions  . Hydrocodone Nausea And Vomiting  . Percocet [Oxycodone-Acetaminophen] Nausea And Vomiting  . Codeine Itching  . Penicillins Itching    Has patient had a PCN reaction causing immediate rash, facial/tongue/throat swelling, SOB or lightheadedness with hypotension: No Has patient had a PCN reaction causing severe rash involving mucus membranes or skin  necrosis: No Has patient had a PCN reaction that required hospitalization No Has patient had a PCN reaction occurring within the last 10 years: No If all of the above answers are "NO", then may proceed with Cephalosporin use.   . Tape Itching and Rash     Paper Tape causes blisters; please use Coban wrap if possible    MEDICATIONS:  Current Outpatient Medications  Medication Sig Dispense Refill  . acetaminophen (TYLENOL) 500 MG tablet Take 500-1,000 mg by mouth every 6 (six) hours as needed for moderate pain.     Marland Kitchen amLODipine (NORVASC) 2.5 MG tablet Take 1 tablet (2.5 mg total) by mouth daily. 2.5 mg + 5 mg =7.5 mg total daily dose    . amLODipine (NORVASC) 5 MG tablet Take 1 tablet (5 mg total) by mouth daily. 2.5 mg + 5 mg =7.5 mg total daily dose  11  . B-D UF III MINI PEN NEEDLES 31G X 5 MM MISC Inject into the skin 4 (four) times daily.    . citalopram (CELEXA) 10 MG tablet TAKE 1 TABLET BY MOUTH EVERY DAY (Patient not taking: Reported on 01/16/2020) 90 tablet 1  . ergocalciferol (VITAMIN D2) 1.25 MG (50000 UT) capsule Take 1 capsule (50,000 Units total) by mouth 2 (two) times a week. 24 capsule 2  . fluocinonide cream (LIDEX) 2.70 % Apply 1 application topically 2 (two) times daily as needed (to access port).     Marland Kitchen guaiFENesin-dextromethorphan (ROBITUSSIN DM) 100-10 MG/5ML syrup Take 5 mLs by mouth every 4 (four) hours as needed for cough. 118 mL 0  . HUMALOG KWIKPEN 100 UNIT/ML KwikPen Inject 1-5 Units into the skin See admin instructions. Sliding scale : up to 4 times a day.    . lidocaine-prilocaine (EMLA) cream Apply to affected area once (Patient not taking: Reported on 01/16/2020) 30 g 3  . loratadine (CLARITIN) 10 MG tablet Take 1 tablet (10 mg total) by mouth See admin instructions. Patient states he takes for 7 days: from 3rd day post chemo until 10th day post chemo (Patient taking differently: Take 10 mg by mouth daily. ) 28 tablet 0  . LORazepam (ATIVAN) 0.5 MG tablet TAKE 1  TABLET BY MOUTH EVERY 6 HOURS AS NEEDED (NAUSEA OR VOMITING). (Patient taking differently: Take 0.5 mg by mouth every 6 (six) hours as needed for anxiety. ) 30 tablet 0  . olmesartan-hydrochlorothiazide (BENICAR HCT) 40-25 MG tablet Take 1 tablet by mouth daily.    Glory Rosebush VERIO test strip 2 (two) times daily.    . pantoprazole (PROTONIX) 40 MG tablet TAKE 1 TABLET BY MOUTH DAILY BEFORE BREAKFAST (Patient taking differently: Take 40 mg by mouth daily. ) 90 tablet 1  . prochlorperazine (COMPAZINE) 10 MG tablet Take 1 tablet (10 mg total) by mouth every 6 (six) hours as needed (Nausea or vomiting). (Patient not taking: Reported on 01/16/2020) 30 tablet 6  . senna-docusate (SENNA S) 8.6-50 MG tablet Take 2 tablets by mouth at bedtime as needed for mild constipation or moderate constipation. 60 tablet 1  . TRESIBA FLEXTOUCH 100 UNIT/ML FlexTouch Pen Inject 40  Units into the skin daily.      No current facility-administered medications for this encounter.    REVIEW OF SYSTEMS:  A 10+ POINT REVIEW OF SYSTEMS WAS OBTAINED including neurology, dermatology, psychiatry, cardiac, respiratory, lymph, extremities, GI, GU, musculoskeletal, constitutional, reproductive, HEENT.  He denies any pain symptoms.  He denies any pain along the scrotum or groin area.   PHYSICAL EXAM:  height is 5' 10.5" (1.791 m) and weight is 216 lb 9.6 oz (98.2 kg). His temperature is 98 F (36.7 C). His blood pressure is 105/85 and his pulse is 88. His respiration is 18 and oxygen saturation is 100%.   General: Alert and oriented, in no acute distress HEENT: Head is normocephalic. Extraocular movements are intact. Oropharynx is clear. Neck: Neck is supple, no palpable cervical or supraclavicular lymphadenopathy. Heart: Regular in rate and rhythm with no murmurs, rubs, or gallops. Chest: Clear to auscultation bilaterally, with no rhonchi, wheezes, or rales. Abdomen: Soft, nontender, nondistended, with no rigidity or  guarding. Extremities: No cyanosis or edema. Lymphatics: see Neck Exam Skin: No concerning lesions. Musculoskeletal: symmetric strength and muscle tone throughout. Neurologic: Cranial nerves II through XII are grossly intact. No obvious focalities. Speech is fluent. Coordination is intact. Psychiatric: Judgment and insight are intact. Affect is appropriate. The inguinal areas are free of adenopathy.  The femoral areas are free of adenopathy.  Patient has a scar on the left inguinal area from his radical orchiectomy.  On examination the patient is a normal circumcised male.  The left testicle is surgically absent.  The right testicle is without masses.  No palpable masses within the scrotal sac.  ECOG = 1  0 - Asymptomatic (Fully active, able to carry on all predisease activities without restriction)  1 - Symptomatic but completely ambulatory (Restricted in physically strenuous activity but ambulatory and able to carry out work of a light or sedentary nature. For example, light housework, office work)  2 - Symptomatic, <50% in bed during the day (Ambulatory and capable of all self care but unable to carry out any work activities. Up and about more than 50% of waking hours)  3 - Symptomatic, >50% in bed, but not bedbound (Capable of only limited self-care, confined to bed or chair 50% or more of waking hours)  4 - Bedbound (Completely disabled. Cannot carry on any self-care. Totally confined to bed or chair)  5 - Death   Eustace Pen MM, Creech RH, Tormey DC, et al. [phone]). "Toxicity and response criteria of the [facility]". [place] Oncol. 5 (6): 649-55  LABORATORY DATA:  Lab Results  Component Value Date   WBC 8.1 02/20/2020   HGB 11.9 (L) 02/20/2020   HCT 36.9 (L) 02/20/2020   MCV 93.2 02/20/2020   PLT 277 02/20/2020   NEUTROABS 4.6 02/20/2020   Lab Results  Component Value Date   NA 139 02/20/2020   K 4.6 02/20/2020   CL 103 02/20/2020   CO2 28 02/20/2020    GLUCOSE 252 (H) 02/20/2020   CREATININE 1.33 (H) 02/20/2020   CALCIUM 10.6 (H) 02/20/2020      RADIOGRAPHY: NM PET Image Restag (PS) Skull Base To Thigh  Result Date: 03/09/2020 CLINICAL DATA:  Subsequent treatment strategy for large B-cell lymphoma involving the left testicle and intrathoracic lymph nodes. Prior left orchectomy in May of 2021, chemotherapy completed in September 2021 with radiation therapy soon to start. EXAM: NUCLEAR MEDICINE PET SKULL BASE TO THIGH TECHNIQUE: 10.1 mCi F-18 FDG was injected  intravenously. Full-ring PET imaging was performed from the skull base to thigh after the radiotracer. CT data was obtained and used for attenuation correction and anatomic localization. Fasting blood glucose: 132 mg/dl COMPARISON:  09/30/2019 FINDINGS: Mediastinal blood pool activity: SUV max 2.9 Liver activity: SUV max 3.7 NECK: No significant abnormal hypermetabolic activity in this region. Incidental CT findings: Mild left common carotid atherosclerotic calcification. CHEST: Left axillary lymph node 0.9 cm in short axis on image 61 series 4 (previously 1.2 cm), maximum SUV 1.4 (formerly 2.5). Currently Deauville 2. Right axillary lymph node 0.5 cm in short axis on image 63 series 4 (formerly 0.8 cm), maximum SUV 1.0 (formerly 2.0). Currently Deauville 2. Trace activity at the scarring site from prior Port-A-Cath, considered benign. No overtly pathologically enlarged or hypermetabolic mediastinal adenopathy. Incidental CT findings: Coronary, aortic arch, and branch vessel atherosclerotic vascular disease. ABDOMEN/PELVIS: Previous nodularity and accentuated metabolic activity along the lateral limb of the right adrenal gland has essentially resolved. It is possible this might of been from an adjacent lymph node. I not see a measurable lymph node nor is there significant nodularity, and the maximum SUV of the right adrenal gland is currently 2.6 (previously 13.0). This is considered Deauville 1. No  splenomegaly or hypermetabolic activity in the spleen. Left external iliac node 0.6 cm in short axis on image 179 of series 4 (formerly 0.9 cm), maximum SUV 1.3 (formerly 2.6). This is currently Deauville 2. Previous accentuated activity along the posterior margin of the left groin scar is substantially improved, previous maximum SUV 5.5, current 2.6. This likely reflects progressive healing response. Left orchectomy, with normal activity in the right testicle with maximum SUV of 4.8, previously 4.9. No new adenopathy or new worrisome hypermetabolic activity. Incidental CT findings: Aortoiliac atherosclerotic vascular disease. Descending and sigmoid colon diverticulosis. SKELETON: No significant abnormal hypermetabolic activity in this region. Incidental CT findings: Grade 1 anterolisthesis of L5 on S1 associated with chronic bilateral L5 pars defects. Lower lumbar spondylosis and degenerative disc disease causing foraminal impingement bilaterally at L4-5 and L5-S1. IMPRESSION: 1. Substantial improvement, with reduction in size of axillary and pelvic lymph nodes, and current Deauville 2 activity. 2. Focus of hypermetabolic activity along the right adrenal gland has resolved. This might have been an adjacent lymph node. 3. Other imaging findings of potential clinical significance: Aortic Atherosclerosis (ICD10-I70.0). Coronary atherosclerosis. Descending and sigmoid colon diverticulosis. Chronic bilateral pars defects at L5. Foraminal impingement at L4-5 and L5-S1. Electronically Signed   By: Van Clines M.D.   On: 03/09/2020 09:19      IMPRESSION: Primary large B-cell diffuse lymphoma presenting in the left testicle  Given the patient's diagnosis and presentation he is at significant risk for relapse within the scrotum and remaining testicle.  In addition he is at risk for relapse within the CNS but has completed intrathecal methotrexate as above.  I would recommend radiation therapy to the scrotum and  remaining testicle to reduce the chances for recurrence in this area.  Today, I talked to the patient  about the findings and work-up thus far.  We discussed the natural history of large B-cell lymphoma and general treatment, highlighting the role of radiotherapy in the management.  We discussed the available radiation techniques, and focused on the details of logistics and delivery.  We reviewed the anticipated acute and late sequelae associated with radiation in this setting.  The patient was encouraged to ask questions that I answered to the best of my ability.  A patient consent form  was discussed and signed.  We retained a copy for our records.  The patient would like to proceed with radiation and will be scheduled for CT simulation.  PLAN: Patient will return next week for CT simulation with treatments to begin in early December.  Anticipate 30 Gray in 15 fractions directed at the scrotum and left testicle region.  Total time spent in this encounter was 65 minutes which included reviewing the patient's most recent consultations, follow-ups, PET scans, ED visit/hospital admission, chemotherapy, physical examination, and documentation.     ------------------------------------------------  [name], PhD, MD  This document serves as a record of services personally performed by Gery Pray, MD. It was created on his behalf by Clerance Lav, a trained medical scribe. The creation of this record is based on the scribe's personal observations and the provider's statements to them. This document has been checked and approved by the attending provider.

## 2020-03-10 NOTE — Progress Notes (Signed)
Patient here for a consult with Dr. Sondra Come.  Lymphoma Location(s) / Histology: Testicular large B-cell  He has a primary large cell lymphoma of the left testicle.  He had this removed in May.    Past/Anticipated interventions by medical oncology, if any: S/p R-CHOP x 5 cycles - completed in 12/2019 S/p IT MTX x 2 --- last dose on 12/26/2019  Weight changes, if any, over the past 6 months: no  Recurrent fevers, or drenching night sweats, if any: no, has sweating but not overly so  SAFETY ISSUES:  Prior radiation? no  Pacemaker/ICD? no  Possible current pregnancy? N/A  Is the patient on methotrexate? No  Current Complaints / other details:  Hospitalized in Sept. For sepsis   BP 105/85 (BP Location: Left Arm, Patient Position: Sitting, Cuff Size: Large)   Pulse 88   Temp 98 F (36.7 C)   Resp 18   Ht 5' 10.5" (1.791 m)   Wt 216 lb 9.6 oz (98.2 kg)   SpO2 100%   BMI 30.64 kg/m    Wt Readings from Last 3 Encounters:  03/10/20 216 lb 9.6 oz (98.2 kg)  02/20/20 205 lb (93 kg)  01/16/20 205 lb 6.4 oz (93.2 kg)

## 2020-03-17 ENCOUNTER — Ambulatory Visit
Admission: RE | Admit: 2020-03-17 | Discharge: 2020-03-17 | Disposition: A | Discharge status: Home / Self Care | Payer: Medicare Other | Source: Ambulatory Visit | Attending: Radiation Oncology | Admitting: Radiation Oncology

## 2020-03-17 DIAGNOSIS — C8339 Diffuse large B-cell lymphoma, extranodal and solid organ sites: Secondary | ICD-10-CM | POA: Diagnosis not present

## 2020-03-17 DIAGNOSIS — C8330 Diffuse large B-cell lymphoma, unspecified site: Secondary | ICD-10-CM

## 2020-03-24 ENCOUNTER — Encounter: Payer: Self-pay | Admitting: Hematology & Oncology

## 2020-03-24 ENCOUNTER — Inpatient Hospital Stay: Payer: Medicare Other | Attending: Hematology

## 2020-03-24 ENCOUNTER — Inpatient Hospital Stay (HOSPITAL_BASED_OUTPATIENT_CLINIC_OR_DEPARTMENT_OTHER): Payer: Medicare Other | Admitting: Hematology & Oncology

## 2020-03-24 ENCOUNTER — Other Ambulatory Visit: Payer: Self-pay

## 2020-03-24 VITALS — BP 124/69 | HR 98 | Temp 98.2°F | Resp 20 | Wt 216.0 lb

## 2020-03-24 DIAGNOSIS — C8339 Diffuse large B-cell lymphoma, extranodal and solid organ sites: Secondary | ICD-10-CM | POA: Diagnosis not present

## 2020-03-24 DIAGNOSIS — Z23 Encounter for immunization: Secondary | ICD-10-CM | POA: Insufficient documentation

## 2020-03-24 DIAGNOSIS — Z888 Allergy status to other drugs, medicaments and biological substances status: Secondary | ICD-10-CM | POA: Insufficient documentation

## 2020-03-24 DIAGNOSIS — C8332 Diffuse large B-cell lymphoma, intrathoracic lymph nodes: Secondary | ICD-10-CM

## 2020-03-24 DIAGNOSIS — Z880 Allergy status to penicillin: Secondary | ICD-10-CM | POA: Diagnosis not present

## 2020-03-24 DIAGNOSIS — C8330 Diffuse large B-cell lymphoma, unspecified site: Secondary | ICD-10-CM | POA: Insufficient documentation

## 2020-03-24 DIAGNOSIS — Z885 Allergy status to narcotic agent status: Secondary | ICD-10-CM | POA: Diagnosis not present

## 2020-03-24 DIAGNOSIS — Z79899 Other long term (current) drug therapy: Secondary | ICD-10-CM | POA: Insufficient documentation

## 2020-03-24 LAB — CMP (CANCER CENTER ONLY)
ALT: 22 U/L (ref 0–44)
AST: 22 U/L (ref 15–41)
Albumin: 4.7 g/dL (ref 3.5–5.0)
Alkaline Phosphatase: 86 U/L (ref 38–126)
Anion gap: 8 (ref 5–15)
BUN: 30 mg/dL — ABNORMAL HIGH (ref 8–23)
CO2: 29 mmol/L (ref 22–32)
Calcium: 10.8 mg/dL — ABNORMAL HIGH (ref 8.9–10.3)
Chloride: 101 mmol/L (ref 98–111)
Creatinine: 1.38 mg/dL — ABNORMAL HIGH (ref 0.61–1.24)
GFR, Estimated: 55 mL/min — ABNORMAL LOW (ref >60)
Glucose, Bld: 168 mg/dL — ABNORMAL HIGH (ref 70–99)
Potassium: 4.3 mmol/L (ref 3.5–5.1)
Sodium: 138 mmol/L (ref 135–145)
Total Bilirubin: 0.4 mg/dL (ref 0.3–1.2)
Total Protein: 7.6 g/dL (ref 6.5–8.1)

## 2020-03-24 LAB — CBC WITH DIFFERENTIAL (CANCER CENTER ONLY)
Abs Immature Granulocytes: 0.02 10*3/uL (ref 0.00–0.07)
Basophils Absolute: 0 10*3/uL (ref 0.0–0.1)
Basophils Relative: 1 %
Eosinophils Absolute: 0.2 10*3/uL (ref 0.0–0.5)
Eosinophils Relative: 3 %
HCT: 40.2 % (ref 39.0–52.0)
Hemoglobin: 13.1 g/dL (ref 13.0–17.0)
Immature Granulocytes: 0 %
Lymphocytes Relative: 35 %
Lymphs Abs: 2.9 10*3/uL (ref 0.7–4.0)
MCH: 29.8 pg (ref 26.0–34.0)
MCHC: 32.6 g/dL (ref 30.0–36.0)
MCV: 91.4 fL (ref 80.0–100.0)
Monocytes Absolute: 0.8 10*3/uL (ref 0.1–1.0)
Monocytes Relative: 10 %
Neutro Abs: 4.3 10*3/uL (ref 1.7–7.7)
Neutrophils Relative %: 51 %
Platelet Count: 289 10*3/uL (ref 150–400)
RBC: 4.4 MIL/uL (ref 4.22–5.81)
RDW: 13.6 % (ref 11.5–15.5)
WBC Count: 8.3 10*3/uL (ref 4.0–10.5)
nRBC: 0 % (ref 0.0–0.2)

## 2020-03-24 LAB — LACTATE DEHYDROGENASE: LDH: 131 U/L (ref 98–192)

## 2020-03-24 MED ORDER — INFLUENZA VAC SPLIT QUAD 0.5 ML IM SUSY
0.5000 mL | PREFILLED_SYRINGE | Freq: Once | INTRAMUSCULAR | Status: AC
  Administered 2020-03-24: 0.5 mL via INTRAMUSCULAR

## 2020-03-24 MED ORDER — INFLUENZA VAC SPLIT QUAD 0.5 ML IM SUSY
PREFILLED_SYRINGE | INTRAMUSCULAR | Status: AC
  Filled 2020-03-24: qty 0.5

## 2020-03-24 NOTE — Progress Notes (Signed)
Hematology and Oncology Follow Up Visit  Patrick Bautista [id number] [birth year] 69 y.o. 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  He will start radiation therapy to the scrotal area tomorrow.  We did do a PET scan on him.  This was done on 03/09/2020.  This shows that he has had a nice result.  He has had improvement in reduction in the adenopathy and activity in his axillary and pelvic lymph nodes.  Activity in the right adrenal gland has resolved.  He feels well.  He is recovered nicely from chemotherapy.  Had a really hard time with the last cycle of chemotherapy.  He had 5 cycles of R-CHOP.  He had a nice Thanksgiving.  He was at his girlfriend's house with family.  He has had no problems with fever.  He has had no bleeding.  There has been no change in bowel or bladder habits.  He has had no issues with cough.  He has had the coronavirus vaccines.  He will get his flu vaccine today.  There is been no headache.  He has had no mouth sores.  Is been no dysphagia or odontophagia.  Overall, his performance status is ECOG 0.    Medications:  Current Outpatient Medications:  .  acetaminophen (TYLENOL) 500 MG tablet, Take 500-1,000 mg by mouth every 6 (six) hours as needed for moderate pain. , Disp: , Rfl:  .  amLODipine (NORVASC) 2.5 MG tablet, Take 1 tablet (2.5 mg total) by mouth daily. 2.5 mg + 5 mg =7.5 mg total daily dose, Disp: , Rfl:  .  amLODipine (NORVASC) 5 MG tablet, Take 1 tablet (5 mg total) by mouth daily. 2.5 mg + 5 mg =7.5 mg total daily dose, Disp: , Rfl: 11 .  B-D UF III MINI PEN NEEDLES 31G X 5 MM MISC, Inject into the skin 4 (four) times daily., Disp: , Rfl:  .  ergocalciferol (VITAMIN D2) 1.25 MG (50000 UT) capsule, Take 1 capsule (50,000 Units  total) by mouth 2 (two) times a week., Disp: 24 capsule, Rfl: 2 .  LORazepam (ATIVAN) 0.5 MG tablet, TAKE 1 TABLET BY MOUTH EVERY 6 HOURS AS NEEDED (NAUSEA OR VOMITING). (Patient taking differently: Take 0.5 mg by mouth every 6 (six) hours as needed for anxiety. ), Disp: 30 tablet, Rfl: 0 .  olmesartan-hydrochlorothiazide (BENICAR HCT) 40-25 MG tablet, Take 1 tablet by mouth daily., Disp: , Rfl:  .  ONETOUCH VERIO test strip, 2 (two) times daily., Disp: , Rfl:  .  pantoprazole (PROTONIX) 40 MG tablet, TAKE 1 TABLET BY MOUTH DAILY BEFORE BREAKFAST (Patient taking differently: Take 40 mg by mouth daily. ), Disp: 90 tablet, Rfl: 1 .  TRESIBA FLEXTOUCH 100 UNIT/ML FlexTouch Pen, Inject 40 Units into the skin daily. , Disp: , Rfl:  .  fluocinonide cream (LIDEX) 6.06 %, Apply 1 application topically 2 (two) times daily as needed (to access port).  (Patient not taking: Reported on 03/24/2020), Disp: , Rfl:  .  guaiFENesin-dextromethorphan (ROBITUSSIN DM) 100-10 MG/5ML syrup, Take 5 mLs by mouth every 4 (four) hours as needed for cough. (Patient not taking: Reported on 03/24/2020), Disp: 118 mL, Rfl: 0 .  HUMALOG KWIKPEN 100 UNIT/ML KwikPen, Inject 1-5 Units  into the skin See admin instructions. Sliding scale : up to 4 times a day. (Patient not taking: Reported on 03/24/2020), Disp: , Rfl:  .  lidocaine-prilocaine (EMLA) cream, Apply to affected area once (Patient not taking: Reported on 01/16/2020), Disp: 30 g, Rfl: 3 .  loratadine (CLARITIN) 10 MG tablet, Take 1 tablet (10 mg total) by mouth See admin instructions. Patient states he takes for 7 days: from 3rd day post chemo until 10th day post chemo (Patient not taking: Reported on 03/24/2020), Disp: 28 tablet, Rfl: 0 .  prochlorperazine (COMPAZINE) 10 MG tablet, Take 1 tablet (10 mg total) by mouth every 6 (six) hours as needed (Nausea or vomiting). (Patient not taking: Reported on 01/16/2020), Disp: 30 tablet, Rfl: 6 .  senna-docusate (SENNA S) 8.6-50 MG tablet,  Take 2 tablets by mouth at bedtime as needed for mild constipation or moderate constipation. (Patient not taking: Reported on 03/24/2020), Disp: 60 tablet, Rfl: 1  Allergies:  Allergies  Allergen Reactions  . Hydrocodone Nausea And Vomiting  . Percocet [Oxycodone-Acetaminophen] Nausea And Vomiting  . Codeine Itching  . Penicillins Itching    Has patient had a PCN reaction causing immediate rash, facial/tongue/throat swelling, SOB or lightheadedness with hypotension: No Has patient had a PCN reaction causing severe rash involving mucus membranes or skin necrosis: No Has patient had a PCN reaction that required hospitalization No Has patient had a PCN reaction occurring within the last 10 years: No If all of the above answers are "NO", then may proceed with Cephalosporin use.   . Tape Itching and Rash     Paper Tape causes blisters; please use Coban wrap if possible    Past Medical History, Surgical history, Social history, and Family History were reviewed and updated.  Review of Systems: Review of Systems  Constitutional: Negative.   HENT:  Negative.   Eyes: Negative.   Respiratory: Negative.   Cardiovascular: Negative.   Gastrointestinal: Negative.   Endocrine: Negative.   Genitourinary: Negative.    Musculoskeletal: Negative.   Skin: Negative.   Neurological: Negative.   Hematological: Negative.   Psychiatric/Behavioral: Negative.     Physical Exam:  weight is 216 lb (98 kg). His oral temperature is 98.2 F (36.8 C). His blood pressure is 124/69 and his pulse is 98. His respiration is 20 and oxygen saturation is 99%.   Wt Readings from Last 3 Encounters:  03/24/20 216 lb (98 kg)  03/10/20 216 lb 9.6 oz (98.2 kg)  02/20/20 205 lb (93 kg)    Physical Exam Vitals reviewed.  HENT:     Head: Normocephalic and atraumatic.  Eyes:     Pupils: Pupils are equal, round, and reactive to light.  Cardiovascular:     Rate and Rhythm: Normal rate and regular rhythm.     Heart  sounds: Normal heart sounds.  Pulmonary:     Effort: Pulmonary effort is normal.     Breath sounds: Normal breath sounds.  Abdominal:     General: Bowel sounds are normal.     Palpations: Abdomen is soft.  Musculoskeletal:        General: No tenderness or deformity. Normal range of motion.     Cervical back: Normal range of motion.  Lymphadenopathy:     Cervical: No cervical adenopathy.  Skin:    General: Skin is warm and dry.     Findings: No erythema or rash.  Neurological:     Mental Status: He is alert and oriented to person, place, and time.  Psychiatric:        Behavior: Behavior normal.        Thought Content: Thought content normal.        Judgment: Judgment normal.      Lab Results  Component Value Date   WBC 8.3 03/24/2020   HGB 13.1 03/24/2020   HCT 40.2 03/24/2020   MCV 91.4 03/24/2020   PLT 289 03/24/2020     Chemistry      Component Value Date/Time   NA 138 03/24/2020 1149   K 4.3 03/24/2020 1149   CL 101 03/24/2020 1149   CO2 29 03/24/2020 1149   BUN 30 (H) 03/24/2020 1149   CREATININE 1.38 (H) 03/24/2020 1149      Component Value Date/Time   CALCIUM 10.8 (H) 03/24/2020 1149   ALKPHOS 86 03/24/2020 1149   AST 22 03/24/2020 1149   ALT 22 03/24/2020 1149   BILITOT 0.4 03/24/2020 1149      Impression and Plan: Patrick Bautista is a very nice 69 year old white male.  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  He had this removed in May.  He then underwent systemic chemotherapy along with intrathecal methotrexate.  He did not want to have any further chemotherapy after the fifth cycle because of the toxicity.  I am sure that he will do well with the radiation therapy.  I would not imagine that he would have a lot of toxicity from this.  Once he has the radiation therapy, we will then plan for another PET scan probably in the spring time.  I am just happy that his recovery from chemotherapy has been so thorough.  We will plan to get  him back in about 6 Hern.  We will see how he is doing.  By then, he should be done with his radiation.   Volanda Napoleon, MD 12/1/20211:04 PM

## 2020-03-24 NOTE — Patient Instructions (Signed)
Influenza Virus Vaccine injection What is this medicine? INFLUENZA VIRUS VACCINE (in floo EN zuh VAHY ruhs vak SEEN) helps to reduce the risk of getting influenza also known as the flu. The vaccine only helps protect you against some strains of the flu. This medicine may be used for other purposes; ask your health care provider or pharmacist if you have questions. COMMON BRAND NAME(S): Afluria, Afluria Quadrivalent, Agriflu, Alfuria, FLUAD, Fluarix, Fluarix Quadrivalent, Flublok, Flublok Quadrivalent, FLUCELVAX, FLUCELVAX Quadrivalent, Flulaval, Flulaval Quadrivalent, Fluvirin, Fluzone, Fluzone High-Dose, Fluzone Intradermal, Fluzone Quadrivalent What should I tell my health care provider before I take this medicine? They need to know if you have any of these conditions:  bleeding disorder like hemophilia  fever or infection  Guillain-Barre syndrome or other neurological problems  immune system problems  infection with the human immunodeficiency virus (HIV) or AIDS  low blood platelet counts  multiple sclerosis  an unusual or allergic reaction to influenza virus vaccine, latex, other medicines, foods, dyes, or preservatives. Different brands of vaccines contain different allergens. Some may contain latex or eggs. Talk to your doctor about your allergies to make sure that you get the right vaccine.  pregnant or trying to get pregnant  breast-feeding How should I use this medicine? This vaccine is for injection into a muscle or under the skin. It is given by a health care professional. A copy of Vaccine Information Statements will be given before each vaccination. Read this sheet carefully each time. The sheet may change frequently. Talk to your healthcare provider to see which vaccines are right for you. Some vaccines should not be used in all age groups. Overdosage: If you think you have taken too much of this medicine contact a poison control center or emergency room at once. NOTE:  This medicine is only for you. Do not share this medicine with others. What if I miss a dose? This does not apply. What may interact with this medicine?  chemotherapy or radiation therapy  medicines that lower your immune system like etanercept, anakinra, infliximab, and adalimumab  medicines that treat or prevent blood clots like warfarin  phenytoin  steroid medicines like prednisone or cortisone  theophylline  vaccines This list may not describe all possible interactions. Give your health care provider a list of all the medicines, herbs, non-prescription drugs, or dietary supplements you use. Also tell them if you smoke, drink alcohol, or use illegal drugs. Some items may interact with your medicine. What should I watch for while using this medicine? Report any side effects that do not go away within 3 days to your doctor or health care professional. Call your health care provider if any unusual symptoms occur within 6 [place]ski of receiving this vaccine. You may still catch the flu, but the illness is not usually as bad. You cannot get the flu from the vaccine. The vaccine will not protect against colds or other illnesses that may cause fever. The vaccine is needed every year. What side effects may I notice from receiving this medicine? Side effects that you should report to your doctor or health care professional as soon as possible:  allergic reactions like skin rash, itching or hives, swelling of the face, lips, or tongue Side effects that usually do not require medical attention (report to your doctor or health care professional if they continue or are bothersome):  fever  headache  muscle aches and pains  pain, tenderness, redness, or swelling at the injection site  tiredness This list may not describe  all possible side effects. Call your doctor for medical advice about side effects. You may report side effects to FDA at 1-800-FDA-1088. Where should I keep my medicine? The  vaccine will be given by a health care professional in a clinic, pharmacy, doctor's office, or other health care setting. You will not be given vaccine doses to store at home. NOTE: This sheet is a summary. It may not cover all possible information. If you have questions about this medicine, talk to your doctor, pharmacist, or health care provider.  2020 Elsevier/Gold Standard (2018-03-05 08:45:43)

## 2020-03-25 ENCOUNTER — Ambulatory Visit
Admission: RE | Admit: 2020-03-25 | Discharge: 2020-03-25 | Disposition: A | Discharge status: Home / Self Care | Payer: Medicare Other | Source: Ambulatory Visit | Attending: Radiation Oncology | Admitting: Radiation Oncology

## 2020-03-25 DIAGNOSIS — C8330 Diffuse large B-cell lymphoma, unspecified site: Secondary | ICD-10-CM

## 2020-03-26 ENCOUNTER — Ambulatory Visit: Payer: Medicare Other

## 2020-03-26 ENCOUNTER — Ambulatory Visit: Admission: RE | Admit: 2020-03-26 | Payer: Medicare Other | Source: Ambulatory Visit

## 2020-03-26 DIAGNOSIS — C8330 Diffuse large B-cell lymphoma, unspecified site: Secondary | ICD-10-CM | POA: Diagnosis not present

## 2020-03-29 ENCOUNTER — Ambulatory Visit: Payer: Medicare Other | Admitting: Radiation Oncology

## 2020-03-29 ENCOUNTER — Ambulatory Visit
Admission: RE | Admit: 2020-03-29 | Discharge: 2020-03-29 | Disposition: A | Discharge status: Home / Self Care | Payer: Medicare Other | Source: Ambulatory Visit | Attending: Radiation Oncology | Admitting: Radiation Oncology

## 2020-03-29 DIAGNOSIS — C8330 Diffuse large B-cell lymphoma, unspecified site: Secondary | ICD-10-CM | POA: Diagnosis not present

## 2020-03-30 ENCOUNTER — Ambulatory Visit
Admission: RE | Admit: 2020-03-30 | Discharge: 2020-03-30 | Disposition: A | Discharge status: Home / Self Care | Payer: Medicare Other | Source: Ambulatory Visit | Attending: Radiation Oncology | Admitting: Radiation Oncology

## 2020-03-30 DIAGNOSIS — C8330 Diffuse large B-cell lymphoma, unspecified site: Secondary | ICD-10-CM | POA: Diagnosis not present

## 2020-03-30 MED ORDER — SONAFINE EX EMUL
1.0000 "application " | Freq: Two times a day (BID) | CUTANEOUS | Status: DC
  Administered 2020-03-30: 1 via TOPICAL

## 2020-03-31 ENCOUNTER — Ambulatory Visit
Admission: RE | Admit: 2020-03-31 | Discharge: 2020-03-31 | Disposition: A | Discharge status: Home / Self Care | Payer: Medicare Other | Source: Ambulatory Visit | Attending: Radiation Oncology | Admitting: Radiation Oncology

## 2020-03-31 ENCOUNTER — Other Ambulatory Visit: Payer: Self-pay

## 2020-03-31 DIAGNOSIS — C8330 Diffuse large B-cell lymphoma, unspecified site: Secondary | ICD-10-CM | POA: Diagnosis not present

## 2020-04-01 ENCOUNTER — Other Ambulatory Visit: Payer: Self-pay | Admitting: Hematology

## 2020-04-01 ENCOUNTER — Ambulatory Visit
Admission: RE | Admit: 2020-04-01 | Discharge: 2020-04-01 | Disposition: A | Discharge status: Home / Self Care | Payer: Medicare Other | Source: Ambulatory Visit | Attending: Radiation Oncology | Admitting: Radiation Oncology

## 2020-04-01 DIAGNOSIS — C8330 Diffuse large B-cell lymphoma, unspecified site: Secondary | ICD-10-CM | POA: Diagnosis not present

## 2020-04-01 NOTE — Telephone Encounter (Signed)
Please review refill request.     Note: The original prescription was discontinued on 03/24/2020 by Cottie Banda, RN for the following reason: Patient Preference. Renewing this prescription may not be appropriate.

## 2020-04-02 ENCOUNTER — Ambulatory Visit
Admission: RE | Admit: 2020-04-02 | Discharge: 2020-04-02 | Disposition: A | Discharge status: Home / Self Care | Payer: Medicare Other | Source: Ambulatory Visit | Attending: Radiation Oncology | Admitting: Radiation Oncology

## 2020-04-02 DIAGNOSIS — C8330 Diffuse large B-cell lymphoma, unspecified site: Secondary | ICD-10-CM | POA: Diagnosis not present

## 2020-04-05 ENCOUNTER — Ambulatory Visit
Admission: RE | Admit: 2020-04-05 | Discharge: 2020-04-05 | Disposition: A | Discharge status: Home / Self Care | Payer: Medicare Other | Source: Ambulatory Visit | Attending: Radiation Oncology | Admitting: Radiation Oncology

## 2020-04-05 DIAGNOSIS — C8330 Diffuse large B-cell lymphoma, unspecified site: Secondary | ICD-10-CM | POA: Diagnosis not present

## 2020-04-06 ENCOUNTER — Ambulatory Visit
Admission: RE | Admit: 2020-04-06 | Discharge: 2020-04-06 | Disposition: A | Discharge status: Home / Self Care | Payer: Medicare Other | Source: Ambulatory Visit | Attending: Radiation Oncology | Admitting: Radiation Oncology

## 2020-04-06 DIAGNOSIS — C8330 Diffuse large B-cell lymphoma, unspecified site: Secondary | ICD-10-CM | POA: Diagnosis not present

## 2020-04-07 ENCOUNTER — Ambulatory Visit
Admission: RE | Admit: 2020-04-07 | Discharge: 2020-04-07 | Disposition: A | Discharge status: Home / Self Care | Payer: Medicare Other | Source: Ambulatory Visit | Attending: Radiation Oncology | Admitting: Radiation Oncology

## 2020-04-07 DIAGNOSIS — C8330 Diffuse large B-cell lymphoma, unspecified site: Secondary | ICD-10-CM | POA: Diagnosis not present

## 2020-04-08 ENCOUNTER — Ambulatory Visit
Admission: RE | Admit: 2020-04-08 | Discharge: 2020-04-08 | Disposition: A | Discharge status: Home / Self Care | Payer: Medicare Other | Source: Ambulatory Visit | Attending: Radiation Oncology | Admitting: Radiation Oncology

## 2020-04-08 ENCOUNTER — Other Ambulatory Visit: Payer: Self-pay

## 2020-04-08 DIAGNOSIS — C8330 Diffuse large B-cell lymphoma, unspecified site: Secondary | ICD-10-CM | POA: Diagnosis not present

## 2020-04-09 ENCOUNTER — Ambulatory Visit
Admission: RE | Admit: 2020-04-09 | Discharge: 2020-04-09 | Disposition: A | Discharge status: Home / Self Care | Payer: Medicare Other | Source: Ambulatory Visit | Attending: Radiation Oncology | Admitting: Radiation Oncology

## 2020-04-09 DIAGNOSIS — C8330 Diffuse large B-cell lymphoma, unspecified site: Secondary | ICD-10-CM | POA: Diagnosis not present

## 2020-04-12 ENCOUNTER — Ambulatory Visit
Admission: RE | Admit: 2020-04-12 | Discharge: 2020-04-12 | Disposition: A | Discharge status: Home / Self Care | Payer: Medicare Other | Source: Ambulatory Visit | Attending: Radiation Oncology | Admitting: Radiation Oncology

## 2020-04-12 DIAGNOSIS — C8330 Diffuse large B-cell lymphoma, unspecified site: Secondary | ICD-10-CM | POA: Diagnosis not present

## 2020-04-13 ENCOUNTER — Ambulatory Visit
Admission: RE | Admit: 2020-04-13 | Discharge: 2020-04-13 | Disposition: A | Discharge status: Home / Self Care | Payer: Medicare Other | Source: Ambulatory Visit | Attending: Radiation Oncology | Admitting: Radiation Oncology

## 2020-04-13 DIAGNOSIS — C8330 Diffuse large B-cell lymphoma, unspecified site: Secondary | ICD-10-CM | POA: Diagnosis not present

## 2020-04-14 ENCOUNTER — Ambulatory Visit: Payer: Medicare Other

## 2020-04-14 ENCOUNTER — Ambulatory Visit
Admission: RE | Admit: 2020-04-14 | Discharge: 2020-04-14 | Disposition: A | Discharge status: Home / Self Care | Payer: Medicare Other | Source: Ambulatory Visit | Attending: Radiation Oncology | Admitting: Radiation Oncology

## 2020-04-14 ENCOUNTER — Encounter: Payer: Self-pay | Admitting: Radiation Oncology

## 2020-04-14 DIAGNOSIS — C8330 Diffuse large B-cell lymphoma, unspecified site: Secondary | ICD-10-CM | POA: Diagnosis not present

## 2020-04-15 ENCOUNTER — Ambulatory Visit: Payer: Medicare Other

## 2020-04-19 ENCOUNTER — Ambulatory Visit: Payer: Medicare Other

## 2020-04-20 ENCOUNTER — Telehealth: Payer: Self-pay

## 2020-04-20 NOTE — Telephone Encounter (Signed)
Returned patient's call stating he was in the bed for 3 days due to the swelling in his groin after his last tx last week. He states it was very painful to get up and clothing hurt his legs and groin. He states today is his first day out and wanted to know if he needs to use anything else on his groin besides sonafine cream. He denies any skin peeling but feels like his groin was burned from tx. He does not feel he needs to come in and has tried sitz baths with Epsom salts. Advised will review this with Dr. Roselind Messier and will be back in touch.

## 2020-05-07 ENCOUNTER — Inpatient Hospital Stay: Payer: Medicare Other | Attending: Hematology

## 2020-05-07 ENCOUNTER — Inpatient Hospital Stay: Payer: Medicare Other | Admitting: Hematology & Oncology

## 2020-05-07 ENCOUNTER — Encounter: Payer: Self-pay | Admitting: Hematology & Oncology

## 2020-05-07 ENCOUNTER — Other Ambulatory Visit: Payer: Self-pay

## 2020-05-07 VITALS — BP 108/70 | HR 106 | Temp 98.1°F | Resp 18 | Wt 219.0 lb

## 2020-05-07 DIAGNOSIS — C8336 Diffuse large B-cell lymphoma, intrapelvic lymph nodes: Secondary | ICD-10-CM | POA: Diagnosis not present

## 2020-05-07 DIAGNOSIS — Z880 Allergy status to penicillin: Secondary | ICD-10-CM | POA: Diagnosis not present

## 2020-05-07 DIAGNOSIS — Z79899 Other long term (current) drug therapy: Secondary | ICD-10-CM | POA: Insufficient documentation

## 2020-05-07 DIAGNOSIS — C8339 Diffuse large B-cell lymphoma, extranodal and solid organ sites: Secondary | ICD-10-CM | POA: Diagnosis not present

## 2020-05-07 DIAGNOSIS — C8332 Diffuse large B-cell lymphoma, intrathoracic lymph nodes: Secondary | ICD-10-CM

## 2020-05-07 DIAGNOSIS — Z923 Personal history of irradiation: Secondary | ICD-10-CM | POA: Diagnosis not present

## 2020-05-07 DIAGNOSIS — L598 Other specified disorders of the skin and subcutaneous tissue related to radiation: Secondary | ICD-10-CM | POA: Insufficient documentation

## 2020-05-07 DIAGNOSIS — Z885 Allergy status to narcotic agent status: Secondary | ICD-10-CM | POA: Diagnosis not present

## 2020-05-07 DIAGNOSIS — C8330 Diffuse large B-cell lymphoma, unspecified site: Secondary | ICD-10-CM

## 2020-05-07 DIAGNOSIS — Z888 Allergy status to other drugs, medicaments and biological substances status: Secondary | ICD-10-CM | POA: Insufficient documentation

## 2020-05-07 LAB — CBC WITH DIFFERENTIAL (CANCER CENTER ONLY)
Abs Immature Granulocytes: 0.03 10*3/uL (ref 0.00–0.07)
Basophils Absolute: 0 10*3/uL (ref 0.0–0.1)
Basophils Relative: 0 %
Eosinophils Absolute: 0.2 10*3/uL (ref 0.0–0.5)
Eosinophils Relative: 3 %
HCT: 38.2 % — ABNORMAL LOW (ref 39.0–52.0)
Hemoglobin: 12.6 g/dL — ABNORMAL LOW (ref 13.0–17.0)
Immature Granulocytes: 0 %
Lymphocytes Relative: 28 %
Lymphs Abs: 2.2 10*3/uL (ref 0.7–4.0)
MCH: 29.2 pg (ref 26.0–34.0)
MCHC: 33 g/dL (ref 30.0–36.0)
MCV: 88.4 fL (ref 80.0–100.0)
Monocytes Absolute: 0.8 10*3/uL (ref 0.1–1.0)
Monocytes Relative: 10 %
Neutro Abs: 4.5 10*3/uL (ref 1.7–7.7)
Neutrophils Relative %: 59 %
Platelet Count: 254 10*3/uL (ref 150–400)
RBC: 4.32 MIL/uL (ref 4.22–5.81)
RDW: 13.2 % (ref 11.5–15.5)
WBC Count: 7.7 10*3/uL (ref 4.0–10.5)
nRBC: 0 % (ref 0.0–0.2)

## 2020-05-07 LAB — CMP (CANCER CENTER ONLY)
ALT: 25 U/L (ref 0–44)
AST: 23 U/L (ref 15–41)
Albumin: 4.6 g/dL (ref 3.5–5.0)
Alkaline Phosphatase: 86 U/L (ref 38–126)
Anion gap: 8 (ref 5–15)
BUN: 33 mg/dL — ABNORMAL HIGH (ref 8–23)
CO2: 28 mmol/L (ref 22–32)
Calcium: 10.6 mg/dL — ABNORMAL HIGH (ref 8.9–10.3)
Chloride: 103 mmol/L (ref 98–111)
Creatinine: 1.62 mg/dL — ABNORMAL HIGH (ref 0.61–1.24)
GFR, Estimated: 45 mL/min — ABNORMAL LOW (ref >60)
Glucose, Bld: 146 mg/dL — ABNORMAL HIGH (ref 70–99)
Potassium: 4.5 mmol/L (ref 3.5–5.1)
Sodium: 139 mmol/L (ref 135–145)
Total Bilirubin: 0.4 mg/dL (ref 0.3–1.2)
Total Protein: 6.9 g/dL (ref 6.5–8.1)

## 2020-05-07 LAB — LACTATE DEHYDROGENASE: LDH: 134 U/L (ref 98–192)

## 2020-05-07 NOTE — Progress Notes (Signed)
Hematology and Oncology Follow Up Visit  Patrick Bautista [id number] 01/15/1951 70 y.o. 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  He is now engaged.  This happened over the Christmas holiday.  I am very happy for him.  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  He finishes radiation therapy and late December right before Christmas.  He has had no fever.  There has been no cough.  He has had no nausea or vomiting.  He is going to the bathroom okay.  He wanted to know about the COVID booster.  I said he would be okay to take this.  He really has had no problems with headache.  He has had no leg swelling.  Overall, his performance status is ECOG 0.    Medications:  Current Outpatient Medications:  .  acetaminophen (TYLENOL) 500 MG tablet, Take 500-1,000 mg by mouth every 6 (six) hours as needed for moderate pain. , Disp: , Rfl:  .  amLODipine (NORVASC) 2.5 MG tablet, Take 1 tablet (2.5 mg total) by mouth daily. 2.5 mg + 5 mg =7.5 mg total daily dose, Disp: , Rfl:  .  amLODipine (NORVASC) 5 MG tablet, Take 1 tablet (5 mg total) by mouth daily. 2.5 mg + 5 mg =7.5 mg total daily dose, Disp: , Rfl: 11 .  B-D UF III MINI PEN NEEDLES 31G X 5 MM MISC, Inject into the skin 4 (four) times daily., Disp: , Rfl:  .  ergocalciferol (VITAMIN D2) 1.25 MG (50000 UT) capsule, Take 1 capsule (50,000 Units total) by mouth 2 (two) times a week., Disp: 24 capsule, Rfl: 2 .  fluocinonide cream (LIDEX) 3.22 %, Apply 1 application topically 2 (two) times daily as needed (to access port).  (Patient not taking: Reported on 03/24/2020), Disp: , Rfl:  .  guaiFENesin-dextromethorphan (ROBITUSSIN DM) 100-10 MG/5ML syrup, Take  5 mLs by mouth every 4 (four) hours as needed for cough. (Patient not taking: Reported on 03/24/2020), Disp: 118 mL, Rfl: 0 .  HUMALOG KWIKPEN 100 UNIT/ML KwikPen, Inject 1-5 Units into the skin See admin instructions. Sliding scale : up to 4 times a day. (Patient not taking: Reported on 03/24/2020), Disp: , Rfl:  .  lidocaine-prilocaine (EMLA) cream, Apply to affected area once (Patient not taking: Reported on 01/16/2020), Disp: 30 g, Rfl: 3 .  loratadine (CLARITIN) 10 MG tablet, Take 1 tablet (10 mg total) by mouth See admin instructions. Patient states he takes for 7 days: from 3rd day post chemo until 10th day post chemo (Patient not taking: Reported on 03/24/2020), Disp: 28 tablet, Rfl: 0 .  LORazepam (ATIVAN) 0.5 MG tablet, TAKE 1 TABLET BY MOUTH EVERY 6 HOURS AS NEEDED (NAUSEA OR VOMITING). (Patient taking differently: Take 0.5 mg by mouth every 6 (six) hours as needed for anxiety. ), Disp: 30 tablet, Rfl: 0 .  olmesartan-hydrochlorothiazide (BENICAR HCT) 40-25 MG tablet, Take 1 tablet by mouth daily., Disp: , Rfl:  .  ONETOUCH VERIO test strip, 2 (two) times daily., Disp: , Rfl:  .  pantoprazole (PROTONIX) 40 MG tablet, TAKE 1 TABLET BY MOUTH DAILY BEFORE BREAKFAST (Patient taking differently: Take 40  mg by mouth daily. ), Disp: 90 tablet, Rfl: 1 .  prochlorperazine (COMPAZINE) 10 MG tablet, Take 1 tablet (10 mg total) by mouth every 6 (six) hours as needed (Nausea or vomiting). (Patient not taking: Reported on 01/16/2020), Disp: 30 tablet, Rfl: 6 .  senna-docusate (SENNA S) 8.6-50 MG tablet, Take 2 tablets by mouth at bedtime as needed for mild constipation or moderate constipation. (Patient not taking: Reported on 03/24/2020), Disp: 60 tablet, Rfl: 1 .  TRESIBA FLEXTOUCH 100 UNIT/ML FlexTouch Pen, Inject 40 Units into the skin daily. , Disp: , Rfl:   Allergies:  Allergies  Allergen Reactions  . Hydrocodone Nausea And Vomiting  . Hydrocodone-Acetaminophen Other (See Comments)  . Oxycodone Hcl  Other (See Comments)  . Penicillin G Benzathine Other (See Comments)  . Percocet [Oxycodone-Acetaminophen] Nausea And Vomiting  . Codeine Itching  . Penicillins Itching    Has patient had a PCN reaction causing immediate rash, facial/tongue/throat swelling, SOB or lightheadedness with hypotension: No Has patient had a PCN reaction causing severe rash involving mucus membranes or skin necrosis: No Has patient had a PCN reaction that required hospitalization No Has patient had a PCN reaction occurring within the last 10 years: No If all of the above answers are "NO", then may proceed with Cephalosporin use.   . Tape Itching and Rash     Paper Tape causes blisters; please use Coban wrap if possible    Past Medical History, Surgical history, Social history, and Family History were reviewed and updated.  Review of Systems: Review of Systems  Constitutional: Negative.   HENT:  Negative.   Eyes: Negative.   Respiratory: Negative.   Cardiovascular: Negative.   Gastrointestinal: Negative.   Endocrine: Negative.   Genitourinary: Negative.    Musculoskeletal: Negative.   Skin: Negative.   Neurological: Negative.   Hematological: Negative.   Psychiatric/Behavioral: Negative.     Physical Exam:  weight is 219 lb (99.3 kg). His oral temperature is 98.1 F (36.7 C). His blood pressure is 108/70 and his pulse is 106 (abnormal). His respiration is 18 and oxygen saturation is 100%.   Wt Readings from Last 3 Encounters:  05/07/20 219 lb (99.3 kg)  03/24/20 216 lb (98 kg)  03/10/20 216 lb 9.6 oz (98.2 kg)    Physical Exam Vitals reviewed.  HENT:     Head: Normocephalic and atraumatic.  Eyes:     Pupils: Pupils are equal, round, and reactive to light.  Cardiovascular:     Rate and Rhythm: Normal rate and regular rhythm.     Heart sounds: Normal heart sounds.  Pulmonary:     Effort: Pulmonary effort is normal.     Breath sounds: Normal breath sounds.  Abdominal:     General: Bowel  sounds are normal.     Palpations: Abdomen is soft.  Musculoskeletal:        General: No tenderness or deformity. Normal range of motion.     Cervical back: Normal range of motion.  Lymphadenopathy:     Cervical: No cervical adenopathy.  Skin:    General: Skin is warm and dry.     Findings: No erythema or rash.  Neurological:     Mental Status: He is alert and oriented to person, place, and time.  Psychiatric:        Behavior: Behavior normal.        Thought Content: Thought content normal.        Judgment: Judgment normal.      Lab  Results  Component Value Date   WBC 7.7 05/07/2020   HGB 12.6 (L) 05/07/2020   HCT 38.2 (L) 05/07/2020   MCV 88.4 05/07/2020   PLT 254 05/07/2020     Chemistry      Component Value Date/Time   NA 139 05/07/2020 1351   K 4.5 05/07/2020 1351   CL 103 05/07/2020 1351   CO2 28 05/07/2020 1351   BUN 33 (H) 05/07/2020 1351   CREATININE 1.62 (H) 05/07/2020 1351      Component Value Date/Time   CALCIUM 10.6 (H) 05/07/2020 1351   ALKPHOS 86 05/07/2020 1351   AST 23 05/07/2020 1351   ALT 25 05/07/2020 1351   BILITOT 0.4 05/07/2020 1351      Impression and Plan: Mr. Vaziri is a very nice 70 year old white male.  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  He had this removed in May.  He then underwent systemic chemotherapy along with intrathecal methotrexate.  He did not want to have any further chemotherapy after the fifth cycle because of the toxicity.  I am sure that he we will be in remission.  I still think he will be cured.  We will set him up with a PET scan the end of February.  I will then see him back after that.  If all looks good on the PET scan, then we will plan to follow him along conservatively.     Volanda Napoleon, MD 1/14/20222:42 PM

## 2020-05-10 ENCOUNTER — Telehealth: Payer: Self-pay

## 2020-05-10 NOTE — Telephone Encounter (Cosign Needed)
Called pt and he is aware of his f.u appt per 1-14 los and aware that we will call once pet is approved   aom

## 2020-05-13 DIAGNOSIS — Z23 Encounter for immunization: Secondary | ICD-10-CM | POA: Diagnosis not present

## 2020-05-16 NOTE — Progress Notes (Incomplete)
  Patient Name: Patrick Bautista MRN: [id number] DOB: 04/17/1951 Referring Physician: Allison Quarry (Profile Not Attached) Date of Service: 04/14/2020 [facility],                                                         End Of Treatment Note  Diagnoses: C83.30-Diffuse large B-cell lymphoma, unspecified site testicular C85.89-Other specified types of non-hodgkin lymphoma, extranodal and solid organ sites  Cancer Staging: Primary large B-cell diffuse lymphoma of the left testicle  Intent: Curative  Radiation Treatment Dates: 03/25/2020 through 04/14/2020 Site Technique Total Dose (Gy) Dose per Fx (Gy) Completed Fx Beam Energies  Scrotum: Pelvis 3D 30/30 2 15/15 6X, 15X   Narrative: The patient tolerated radiation therapy relatively well. He did report one episode of nocturia, hard stools, and some irritation/sensitivity to the skin of the scrotum and surrounding area. He denied any pain, dysuria, hematuria, nausea, vomiting, diarrhea, urinary urgency, and rectal bleeding. On examination, the patient had some erythema along the scrotum and skin folds, but no skin breakdown. He used cream as needed.  Plan: The patient will follow-up with radiation oncology in one month.  ________________________________________________   [name], PhD, MD  This document serves as a record of services personally performed by Gery Pray, MD. It was created on his behalf by Clerance Lav, a trained medical scribe. The creation of this record is based on the scribe's personal observations and the provider's statements to them. This document has been checked and approved by the attending provider.

## 2020-05-16 NOTE — Progress Notes (Signed)
Radiation Oncology         ([phone]) [phone] ________________________________  Name: Patrick Bautista MRN: [id number]  Date: 05/17/2020  DOB: February 27, 1951  Follow-Up Visit Note  CC: Patrick Bautista, L.[name], MD  Patrick Bautista, L.[name], MD    ICD-10-CM   1. 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 He was last seen by Dr. Marin Bautista on 05/07/2020, during which time he did not want to continue any chemotherapy after cycle five given toxicities. Dr. Marin Bautista was hopeful that the patient was in remission. PET scan will be completed next month.  On review of systems, he reports resolution of his blistering along the scrotum and upper thigh area.  It sounds like he was miserable for a few days.  He did not have any difficulties with urination or bowel movements but complained of intense pain with both urination and bowel movements for a few days after his radiation therapy. He denies bleeding from the area.  He has attempted intercourse recently and this was uncomfortable for him.  Dr. Marin Bautista has suggested he wait another month before initiation of intercourse.  Interval history is significant for the patient becoming engaged over the Christmas holidays.              ALLERGIES:  is allergic to hydrocodone, hydrocodone-acetaminophen, oxycodone hcl, penicillin g benzathine, percocet [oxycodone-acetaminophen], codeine, penicillins, and tape.  Meds: Current Outpatient Medications  Medication Sig Dispense Refill  . acetaminophen (TYLENOL) 500 MG tablet Take 500-1,000 mg by mouth every 6 (six) hours as needed for moderate pain.     Marland Kitchen  amLODipine (NORVASC) 2.5 MG tablet Take 1 tablet (2.5 mg total) by mouth daily. 2.5 mg + 5 mg =7.5 mg total daily dose    . amLODipine (NORVASC) 5 MG tablet Take 1 tablet (5 mg total) by mouth daily. 2.5 mg + 5 mg =7.5 mg total daily dose  11  . B-D UF III MINI PEN NEEDLES 31G X 5 MM MISC Inject into the skin 4 (four) times daily.    . ergocalciferol (VITAMIN D2) 1.25 MG (50000 UT) capsule Take 1 capsule (50,000 Units total) by mouth 2 (two) times a week. 24 capsule 2  . LORazepam (ATIVAN) 0.5 MG tablet TAKE 1 TABLET BY MOUTH EVERY 6 HOURS AS NEEDED (NAUSEA OR VOMITING). (Patient taking differently: Take 0.5 mg by mouth every 6 (six) hours as needed for anxiety.) 30 tablet 0  . olmesartan-hydrochlorothiazide (BENICAR HCT) 40-25 MG tablet Take 1 tablet by mouth daily.    Patrick Bautista VERIO test strip 2 (two) times daily.    . pantoprazole (PROTONIX) 40 MG tablet TAKE 1 TABLET BY MOUTH DAILY BEFORE BREAKFAST (Patient taking differently: Take 40 mg by mouth daily.) 90 tablet 1  . TRESIBA FLEXTOUCH 100 UNIT/ML FlexTouch Pen Inject 40 Units into the skin daily.     . fluocinonide cream (LIDEX) 9.32 % Apply 1 application topically 2 (two) times daily as needed (to access port).  (Patient not taking: No sig reported)    . guaiFENesin-dextromethorphan (ROBITUSSIN DM) 100-10 MG/5ML syrup Take 5 mLs by mouth every 4 (four) hours as needed for  cough. (Patient not taking: No sig reported) 118 mL 0  . HUMALOG KWIKPEN 100 UNIT/ML KwikPen Inject 1-5 Units into the skin See admin instructions. Sliding scale : up to 4 times a day. (Patient not taking: No sig reported)    . lidocaine-prilocaine (EMLA) cream Apply to affected area once (Patient not taking: No sig reported) 30 g 3  . loratadine (CLARITIN) 10 MG tablet Take 1 tablet (10 mg total) by mouth See admin instructions. Patient states he takes for 7 days: from 3rd day post chemo until 10th day post chemo (Patient not taking: No sig reported) 28 tablet 0  .  prochlorperazine (COMPAZINE) 10 MG tablet Take 1 tablet (10 mg total) by mouth every 6 (six) hours as needed (Nausea or vomiting). (Patient not taking: No sig reported) 30 tablet 6  . senna-docusate (SENNA S) 8.6-50 MG tablet Take 2 tablets by mouth at bedtime as needed for mild constipation or moderate constipation. (Patient not taking: No sig reported) 60 tablet 1   No current facility-administered medications for this encounter.    Physical Findings: The patient is in no acute distress. Patient is alert and oriented.  height is 5\' 10"  (1.778 m) and weight is 217 lb 6 oz (98.6 kg). His temporal temperature is 96.8 F (36 C) (abnormal). His blood pressure is 134/78 and his pulse is 109 (abnormal). His respiration is 18 and oxygen saturation is 99%.    Lungs are clear to auscultation bilaterally. Heart has regular rate and rhythm. No palpable cervical, supraclavicular, or axillary adenopathy. Abdomen soft, non-tender, normal bowel sounds.  No inguinal adenopathy appreciated.  The patient's skin along the upper thigh and scrotum area is healed well at this time.  He continues to have some hyperpigmentation changes in these areas.  Mild swelling is noted along the scrotal sac.  No  obvious palpable mass but detailed examination of the remaining testicle not performed in light of his continued discomfort.  No significant reaction noted along the penis at this time.  Lab Findings: Lab Results  Component Value Date   WBC 7.7 05/07/2020   HGB 12.6 (L) 05/07/2020   HCT 38.2 (L) 05/07/2020   MCV 88.4 05/07/2020   PLT 254 05/07/2020    Radiographic Findings: No results found.  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  As above the patient had significant radiation dermatitis with blistering and he was very uncomfortable for several days but is doing much better at this time.  Plan: PET scan will be performed  next month. The patient is scheduled to follow up with Dr. Marin Bautista on 06/23/2020.  In light of the patient's close follow-up with Dr. Marin Bautista have not scheduled him for formal follow-up appointment but would be glad to see him anytime.   ____________________________________   [name], PhD, MD  This document serves as a record of services personally performed by Gery Pray, MD. It was created on his behalf by Clerance Lav, a trained medical scribe. The creation of this record is based on the scribe's personal observations and the provider's statements to them. This document has been checked and approved by the attending provider.

## 2020-05-17 ENCOUNTER — Other Ambulatory Visit: Payer: Self-pay

## 2020-05-17 ENCOUNTER — Ambulatory Visit
Admission: RE | Admit: 2020-05-17 | Discharge: 2020-05-17 | Disposition: A | Discharge status: Home / Self Care | Payer: Medicare Other | Source: Ambulatory Visit | Attending: Radiation Oncology | Admitting: Radiation Oncology

## 2020-05-17 ENCOUNTER — Encounter: Payer: Self-pay | Admitting: Radiation Oncology

## 2020-05-17 DIAGNOSIS — Z923 Personal history of irradiation: Secondary | ICD-10-CM | POA: Diagnosis not present

## 2020-05-17 DIAGNOSIS — C8589 Other specified types of non-Hodgkin lymphoma, extranodal and solid organ sites: Secondary | ICD-10-CM | POA: Diagnosis not present

## 2020-05-17 DIAGNOSIS — Z79899 Other long term (current) drug therapy: Secondary | ICD-10-CM | POA: Diagnosis not present

## 2020-05-17 DIAGNOSIS — C8330 Diffuse large B-cell lymphoma, unspecified site: Secondary | ICD-10-CM
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
## 2020-06-15 ENCOUNTER — Other Ambulatory Visit: Payer: Self-pay

## 2020-06-15 ENCOUNTER — Telehealth: Payer: Self-pay

## 2020-06-15 ENCOUNTER — Ambulatory Visit (HOSPITAL_COMMUNITY)
Admission: RE | Admit: 2020-06-15 | Discharge: 2020-06-15 | Disposition: A | Discharge status: Home / Self Care | Payer: Medicare Other | Source: Ambulatory Visit | Attending: Hematology & Oncology | Admitting: Hematology & Oncology

## 2020-06-15 DIAGNOSIS — K5730 Diverticulosis of large intestine without perforation or abscess without bleeding: Secondary | ICD-10-CM | POA: Diagnosis not present

## 2020-06-15 DIAGNOSIS — I2510 Atherosclerotic heart disease of native coronary artery without angina pectoris: Secondary | ICD-10-CM | POA: Insufficient documentation

## 2020-06-15 DIAGNOSIS — I700 Atherosclerosis of aorta: Secondary | ICD-10-CM | POA: Diagnosis not present

## 2020-06-15 DIAGNOSIS — C8336 Diffuse large B-cell lymphoma, intrapelvic lymph nodes: Secondary | ICD-10-CM | POA: Insufficient documentation

## 2020-06-15 DIAGNOSIS — R911 Solitary pulmonary nodule: Secondary | ICD-10-CM | POA: Diagnosis not present

## 2020-06-15 DIAGNOSIS — C8330 Diffuse large B-cell lymphoma, unspecified site: Secondary | ICD-10-CM | POA: Diagnosis not present

## 2020-06-15 LAB — GLUCOSE, CAPILLARY: Glucose-Capillary: 162 mg/dL — ABNORMAL HIGH (ref 70–99)

## 2020-06-15 MED ORDER — FLUDEOXYGLUCOSE F - 18 (FDG) INJECTION
11.2000 | Freq: Once | INTRAVENOUS | Status: AC | PRN
  Administered 2020-06-15: 11.2 via INTRAVENOUS

## 2020-06-15 NOTE — Telephone Encounter (Signed)
Patient called asking if his PET scan results were in, Called patient back and informed him they were not back but will call once they come in and Dr.Ennever reviews them. Patient verbalized understanding and denies any other questions or concerns at this time.

## 2020-06-16 ENCOUNTER — Telehealth: Payer: Self-pay

## 2020-06-16 NOTE — Telephone Encounter (Signed)
Received call from pt requesting PET results. Per Dr Marin Olp, no evidence of lymphoma. Results provided to pt via phone who verbalizes understanding. dph

## 2020-06-18 ENCOUNTER — Other Ambulatory Visit: Payer: Self-pay | Admitting: Hematology

## 2020-06-18 DIAGNOSIS — C8339 Diffuse large B-cell lymphoma, extranodal and solid organ sites: Secondary | ICD-10-CM

## 2020-06-23 ENCOUNTER — Encounter: Payer: Self-pay | Admitting: Hematology & Oncology

## 2020-06-23 ENCOUNTER — Inpatient Hospital Stay: Payer: Medicare Other | Attending: Hematology

## 2020-06-23 ENCOUNTER — Other Ambulatory Visit: Payer: Self-pay

## 2020-06-23 ENCOUNTER — Telehealth: Payer: Self-pay | Admitting: Hematology & Oncology

## 2020-06-23 ENCOUNTER — Inpatient Hospital Stay: Payer: Medicare Other | Admitting: Hematology & Oncology

## 2020-06-23 VITALS — BP 103/64 | Temp 98.3°F | Resp 18 | Ht 70.0 in | Wt 218.0 lb

## 2020-06-23 DIAGNOSIS — Z885 Allergy status to narcotic agent status: Secondary | ICD-10-CM | POA: Insufficient documentation

## 2020-06-23 DIAGNOSIS — Z888 Allergy status to other drugs, medicaments and biological substances status: Secondary | ICD-10-CM | POA: Diagnosis not present

## 2020-06-23 DIAGNOSIS — C8336 Diffuse large B-cell lymphoma, intrapelvic lymph nodes: Secondary | ICD-10-CM

## 2020-06-23 DIAGNOSIS — Z880 Allergy status to penicillin: Secondary | ICD-10-CM | POA: Insufficient documentation

## 2020-06-23 DIAGNOSIS — Z79899 Other long term (current) drug therapy: Secondary | ICD-10-CM | POA: Insufficient documentation

## 2020-06-23 DIAGNOSIS — C8330 Diffuse large B-cell lymphoma, unspecified site: Secondary | ICD-10-CM

## 2020-06-23 DIAGNOSIS — C8339 Diffuse large B-cell lymphoma, extranodal and solid organ sites: Secondary | ICD-10-CM | POA: Insufficient documentation

## 2020-06-23 DIAGNOSIS — N179 Acute kidney failure, unspecified: Secondary | ICD-10-CM | POA: Diagnosis not present

## 2020-06-23 LAB — CBC WITH DIFFERENTIAL (CANCER CENTER ONLY)
Abs Immature Granulocytes: 0.05 10*3/uL (ref 0.00–0.07)
Basophils Absolute: 0 10*3/uL (ref 0.0–0.1)
Basophils Relative: 0 %
Eosinophils Absolute: 0.3 10*3/uL (ref 0.0–0.5)
Eosinophils Relative: 3 %
HCT: 39.2 % (ref 39.0–52.0)
Hemoglobin: 13.1 g/dL (ref 13.0–17.0)
Immature Granulocytes: 1 %
Lymphocytes Relative: 29 %
Lymphs Abs: 2.3 10*3/uL (ref 0.7–4.0)
MCH: 29 pg (ref 26.0–34.0)
MCHC: 33.4 g/dL (ref 30.0–36.0)
MCV: 86.7 fL (ref 80.0–100.0)
Monocytes Absolute: 0.9 10*3/uL (ref 0.1–1.0)
Monocytes Relative: 11 %
Neutro Abs: 4.3 10*3/uL (ref 1.7–7.7)
Neutrophils Relative %: 56 %
Platelet Count: 300 10*3/uL (ref 150–400)
RBC: 4.52 MIL/uL (ref 4.22–5.81)
RDW: 13.5 % (ref 11.5–15.5)
WBC Count: 7.8 10*3/uL (ref 4.0–10.5)
nRBC: 0 % (ref 0.0–0.2)

## 2020-06-23 LAB — CMP (CANCER CENTER ONLY)
ALT: 25 U/L (ref 0–44)
AST: 23 U/L (ref 15–41)
Albumin: 4.7 g/dL (ref 3.5–5.0)
Alkaline Phosphatase: 86 U/L (ref 38–126)
Anion gap: 7 (ref 5–15)
BUN: 28 mg/dL — ABNORMAL HIGH (ref 8–23)
CO2: 30 mmol/L (ref 22–32)
Calcium: 10.4 mg/dL — ABNORMAL HIGH (ref 8.9–10.3)
Chloride: 101 mmol/L (ref 98–111)
Creatinine: 1.53 mg/dL — ABNORMAL HIGH (ref 0.61–1.24)
GFR, Estimated: 49 mL/min — ABNORMAL LOW (ref >60)
Glucose, Bld: 174 mg/dL — ABNORMAL HIGH (ref 70–99)
Potassium: 4.4 mmol/L (ref 3.5–5.1)
Sodium: 138 mmol/L (ref 135–145)
Total Bilirubin: 0.5 mg/dL (ref 0.3–1.2)
Total Protein: 7.3 g/dL (ref 6.5–8.1)

## 2020-06-23 LAB — LACTATE DEHYDROGENASE: LDH: 147 U/L (ref 98–192)

## 2020-06-23 NOTE — Telephone Encounter (Signed)
Appointments scheduled calendar printed & mailed per 3/2 los 

## 2020-06-23 NOTE — Progress Notes (Signed)
Hematology and Oncology Follow Up Visit  Patrick Bautista [id number] [birth year] 70 y.o. 06/23/2020   Principle Diagnosis:   Primary large B-cell diffuse lymphoma of the LEFT testicle  Current Therapy:    S/p R-CHOP x 5 cycles - completed in 12/2019  S/p IT MTX x 2 --- last dose on 12/26/2019  S/P XRT -- completed 3000 rad on 04/14/2020     Interim History:  Patrick Bautista is back for his follow-up.  So far, everything is going quite well.  He had a PET scan done a week ago.  The PET scan did not show any evidence of active lymphoma.  However, there was mention of a 5 mm nodule in his lung.  I think this was in the right lower lung.  He has had no problems with fever.  He has had no change in bowel or bladder habits.  He had terrible radiation dermatitis in the groin area after his inguinal scrotal radiation.  This is healed up.  He now is more active.  His his blood sugars have been on the high side.  Again he says this is because he has not been able to exercise.  He has developed a skin rash after he had a Covid booster.  He has had no problems with bleeding.  There is no change in bowel or bladder habits.  He is had no headache.  Overall, his performance status is ECOG 0.     Medications:  Current Outpatient Medications:  .  acetaminophen (TYLENOL) 500 MG tablet, Take 500-1,000 mg by mouth every 6 (six) hours as needed for moderate pain. , Disp: , Rfl:  .  amLODipine (NORVASC) 2.5 MG tablet, Take 1 tablet (2.5 mg total) by mouth daily. 2.5 mg + 5 mg =7.5 mg total daily dose, Disp: , Rfl:  .  amLODipine (NORVASC) 5 MG tablet, Take 1 tablet (5 mg total) by mouth daily. 2.5 mg + 5 mg =7.5 mg total daily dose, Disp: , Rfl: 11 .  B-D UF III MINI PEN NEEDLES 31G X 5 MM MISC, Inject into the skin 4 (four) times daily., Disp: , Rfl:  .  ergocalciferol (VITAMIN D2) 1.25 MG (50000 UT) capsule, Take 1 capsule (50,000 Units total) by mouth 2 (two) times a week., Disp: 24 capsule, Rfl: 2 .   loratadine (CLARITIN) 10 MG tablet, Take 1 tablet (10 mg total) by mouth See admin instructions. Patient states he takes for 7 days: from 3rd day post chemo until 10th day post chemo, Disp: 28 tablet, Rfl: 0 .  LORazepam (ATIVAN) 0.5 MG tablet, TAKE 1 TABLET BY MOUTH EVERY 6 HOURS AS NEEDED (NAUSEA OR VOMITING). (Patient taking differently: Take 0.5 mg by mouth every 6 (six) hours as needed for anxiety.), Disp: 30 tablet, Rfl: 0 .  olmesartan-hydrochlorothiazide (BENICAR HCT) 40-25 MG tablet, Take 1 tablet by mouth daily., Disp: , Rfl:  .  ONETOUCH VERIO test strip, 2 (two) times daily., Disp: , Rfl:  .  pantoprazole (PROTONIX) 40 MG tablet, TAKE 1 TABLET BY MOUTH DAILY BEFORE BREAKFAST (Patient taking differently: Take 40 mg by mouth daily.), Disp: 90 tablet, Rfl: 1 .  TRESIBA FLEXTOUCH 100 UNIT/ML FlexTouch Pen, Inject 40 Units into the skin daily. , Disp: , Rfl:  .  fluocinonide cream (LIDEX) 6.44 %, Apply 1 application topically 2 (two) times daily as needed (to access port).  (Patient not taking: No sig reported), Disp: , Rfl:  .  guaiFENesin-dextromethorphan (ROBITUSSIN DM) 100-10 MG/5ML syrup, Take 5 mLs  by mouth every 4 (four) hours as needed for cough. (Patient not taking: No sig reported), Disp: 118 mL, Rfl: 0 .  HUMALOG KWIKPEN 100 UNIT/ML KwikPen, Inject 1-5 Units into the skin See admin instructions. Sliding scale : up to 4 times a day. (Patient not taking: No sig reported), Disp: , Rfl:  .  lidocaine-prilocaine (EMLA) cream, Apply to affected area once (Patient not taking: No sig reported), Disp: 30 g, Rfl: 3 .  prochlorperazine (COMPAZINE) 10 MG tablet, Take 1 tablet (10 mg total) by mouth every 6 (six) hours as needed (Nausea or vomiting). (Patient not taking: No sig reported), Disp: 30 tablet, Rfl: 6 .  senna-docusate (SENNA S) 8.6-50 MG tablet, Take 2 tablets by mouth at bedtime as needed for mild constipation or moderate constipation. (Patient not taking: No sig reported), Disp: 60  tablet, Rfl: 1  Allergies:  Allergies  Allergen Reactions  . Hydrocodone-Acetaminophen Other (See Comments)  . Oxycodone Hcl Other (See Comments)  . Penicillin G Benzathine Other (See Comments)  . Codeine Itching  . Hydrocodone Nausea And Vomiting  . Penicillins Itching    Has patient had a PCN reaction causing immediate rash, facial/tongue/throat swelling, SOB or lightheadedness with hypotension: No Has patient had a PCN reaction causing severe rash involving mucus membranes or skin necrosis: No Has patient had a PCN reaction that required hospitalization No Has patient had a PCN reaction occurring within the last 10 years: No If all of the above answers are "NO", then may proceed with Cephalosporin use.   Marland Kitchen Percocet [Oxycodone-Acetaminophen] Nausea And Vomiting  . Tape Itching and Rash     Paper Tape causes blisters; please use Coban wrap if possible    Past Medical History, Surgical history, Social history, and Family History were reviewed and updated.  Review of Systems: Review of Systems  Constitutional: Negative.   HENT:  Negative.   Eyes: Negative.   Respiratory: Negative.   Cardiovascular: Negative.   Gastrointestinal: Negative.   Endocrine: Negative.   Genitourinary: Negative.    Musculoskeletal: Negative.   Skin: Negative.   Neurological: Negative.   Hematological: Negative.   Psychiatric/Behavioral: Negative.     Physical Exam:  height is 5\' 10"  (1.778 m) and weight is 218 lb (98.9 kg). His oral temperature is 98.3 F (36.8 C). His blood pressure is 103/64. His respiration is 18 and oxygen saturation is 100%.   Wt Readings from Last 3 Encounters:  06/23/20 218 lb (98.9 kg)  05/17/20 217 lb 6 oz (98.6 kg)  05/07/20 219 lb (99.3 kg)    Physical Exam Vitals reviewed.  HENT:     Head: Normocephalic and atraumatic.  Eyes:     Pupils: Pupils are equal, round, and reactive to light.  Cardiovascular:     Rate and Rhythm: Normal rate and regular rhythm.      Heart sounds: Normal heart sounds.  Pulmonary:     Effort: Pulmonary effort is normal.     Breath sounds: Normal breath sounds.  Abdominal:     General: Bowel sounds are normal.     Palpations: Abdomen is soft.  Musculoskeletal:        General: No tenderness or deformity. Normal range of motion.     Cervical back: Normal range of motion.  Lymphadenopathy:     Cervical: No cervical adenopathy.  Skin:    General: Skin is warm and dry.     Findings: No erythema or rash.  Neurological:     Mental Status: He is  alert and oriented to person, place, and time.  Psychiatric:        Behavior: Behavior normal.        Thought Content: Thought content normal.        Judgment: Judgment normal.      Lab Results  Component Value Date   WBC 7.8 06/23/2020   HGB 13.1 06/23/2020   HCT 39.2 06/23/2020   MCV 86.7 06/23/2020   PLT 300 06/23/2020     Chemistry      Component Value Date/Time   NA 138 06/23/2020 0822   K 4.4 06/23/2020 0822   CL 101 06/23/2020 0822   CO2 30 06/23/2020 0822   BUN 28 (H) 06/23/2020 0822   CREATININE 1.53 (H) 06/23/2020 0822      Component Value Date/Time   CALCIUM 10.4 (H) 06/23/2020 0822   ALKPHOS 86 06/23/2020 0822   AST 23 06/23/2020 0822   ALT 25 06/23/2020 0822   BILITOT 0.5 06/23/2020 5638      Impression and Plan: Patrick Bautista is a very nice 70 year old white male.  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  He had this removed in May.  He then underwent systemic chemotherapy along with intrathecal methotrexate.  He did not want to have any further chemotherapy after the fifth cycle because of the toxicity.  I am happy about the PET scan.  I really think that he is going to be cured.  I would like to have him come back in 2 months to see me.  I want to make sure that his blood sugars do not get out of control.  I think he is on insulin right now.    Volanda Napoleon, MD 3/2/202210:13 AM

## 2020-06-24 ENCOUNTER — Other Ambulatory Visit: Payer: Self-pay | Admitting: Hematology

## 2020-08-01 ENCOUNTER — Other Ambulatory Visit: Payer: Self-pay | Admitting: Hematology

## 2020-08-01 DIAGNOSIS — C8339 Diffuse large B-cell lymphoma, extranodal and solid organ sites: Secondary | ICD-10-CM

## 2020-08-04 ENCOUNTER — Other Ambulatory Visit: Payer: Self-pay | Admitting: Hematology & Oncology

## 2020-08-04 DIAGNOSIS — C8339 Diffuse large B-cell lymphoma, extranodal and solid organ sites: Secondary | ICD-10-CM

## 2020-08-11 ENCOUNTER — Other Ambulatory Visit: Payer: Self-pay

## 2020-08-11 ENCOUNTER — Encounter: Payer: Self-pay | Admitting: Hematology & Oncology

## 2020-08-11 ENCOUNTER — Inpatient Hospital Stay: Payer: Medicare Other | Attending: Hematology

## 2020-08-11 ENCOUNTER — Telehealth: Payer: Self-pay

## 2020-08-11 ENCOUNTER — Inpatient Hospital Stay (HOSPITAL_BASED_OUTPATIENT_CLINIC_OR_DEPARTMENT_OTHER): Payer: Medicare Other | Admitting: Hematology & Oncology

## 2020-08-11 VITALS — BP 104/66 | HR 88 | Temp 98.2°F | Resp 18 | Wt 221.0 lb

## 2020-08-11 DIAGNOSIS — C8339 Diffuse large B-cell lymphoma, extranodal and solid organ sites: Secondary | ICD-10-CM | POA: Insufficient documentation

## 2020-08-11 DIAGNOSIS — Z79899 Other long term (current) drug therapy: Secondary | ICD-10-CM | POA: Diagnosis not present

## 2020-08-11 DIAGNOSIS — C8333 Diffuse large B-cell lymphoma, intra-abdominal lymph nodes: Secondary | ICD-10-CM | POA: Diagnosis not present

## 2020-08-11 DIAGNOSIS — Z880 Allergy status to penicillin: Secondary | ICD-10-CM | POA: Diagnosis not present

## 2020-08-11 DIAGNOSIS — N179 Acute kidney failure, unspecified: Secondary | ICD-10-CM

## 2020-08-11 DIAGNOSIS — Z888 Allergy status to other drugs, medicaments and biological substances status: Secondary | ICD-10-CM | POA: Insufficient documentation

## 2020-08-11 DIAGNOSIS — Z885 Allergy status to narcotic agent status: Secondary | ICD-10-CM | POA: Insufficient documentation

## 2020-08-11 DIAGNOSIS — L598 Other specified disorders of the skin and subcutaneous tissue related to radiation: Secondary | ICD-10-CM | POA: Insufficient documentation

## 2020-08-11 LAB — CMP (CANCER CENTER ONLY)
ALT: 25 U/L (ref 0–44)
AST: 25 U/L (ref 15–41)
Albumin: 4.8 g/dL (ref 3.5–5.0)
Alkaline Phosphatase: 93 U/L (ref 38–126)
Anion gap: 7 (ref 5–15)
BUN: 33 mg/dL — ABNORMAL HIGH (ref 8–23)
CO2: 28 mmol/L (ref 22–32)
Calcium: 10.6 mg/dL — ABNORMAL HIGH (ref 8.9–10.3)
Chloride: 102 mmol/L (ref 98–111)
Creatinine: 1.59 mg/dL — ABNORMAL HIGH (ref 0.61–1.24)
GFR, Estimated: 46 mL/min — ABNORMAL LOW (ref >60)
Glucose, Bld: 150 mg/dL — ABNORMAL HIGH (ref 70–99)
Potassium: 4.3 mmol/L (ref 3.5–5.1)
Sodium: 137 mmol/L (ref 135–145)
Total Bilirubin: 0.5 mg/dL (ref 0.3–1.2)
Total Protein: 7.8 g/dL (ref 6.5–8.1)

## 2020-08-11 LAB — CBC WITH DIFFERENTIAL (CANCER CENTER ONLY)
Abs Immature Granulocytes: 0.04 10*3/uL (ref 0.00–0.07)
Basophils Absolute: 0 10*3/uL (ref 0.0–0.1)
Basophils Relative: 0 %
Eosinophils Absolute: 0.2 10*3/uL (ref 0.0–0.5)
Eosinophils Relative: 2 %
HCT: 38.1 % — ABNORMAL LOW (ref 39.0–52.0)
Hemoglobin: 12.7 g/dL — ABNORMAL LOW (ref 13.0–17.0)
Immature Granulocytes: 1 %
Lymphocytes Relative: 24 %
Lymphs Abs: 1.8 10*3/uL (ref 0.7–4.0)
MCH: 29.6 pg (ref 26.0–34.0)
MCHC: 33.3 g/dL (ref 30.0–36.0)
MCV: 88.8 fL (ref 80.0–100.0)
Monocytes Absolute: 0.7 10*3/uL (ref 0.1–1.0)
Monocytes Relative: 9 %
Neutro Abs: 4.7 10*3/uL (ref 1.7–7.7)
Neutrophils Relative %: 64 %
Platelet Count: 306 10*3/uL (ref 150–400)
RBC: 4.29 MIL/uL (ref 4.22–5.81)
RDW: 14.1 % (ref 11.5–15.5)
WBC Count: 7.4 10*3/uL (ref 4.0–10.5)
nRBC: 0 % (ref 0.0–0.2)

## 2020-08-11 LAB — HEMOGLOBIN A1C
Hgb A1c MFr Bld: 8.8 % — ABNORMAL HIGH (ref 4.8–5.6)
Mean Plasma Glucose: 205.86 mg/dL

## 2020-08-11 LAB — LACTATE DEHYDROGENASE: LDH: 159 U/L (ref 98–192)

## 2020-08-11 NOTE — Progress Notes (Signed)
Hematology and Oncology Follow Up Visit  Patrick Bautista [id number] April 14, 1951 70 y.o. 08/11/2020   Principle Diagnosis:   Primary large B-cell diffuse lymphoma of the LEFT testicle  Current Therapy:    S/p R-CHOP x 5 cycles - completed in 12/2019  S/p IT MTX x 2 --- last dose on 12/26/2019  S/P XRT -- completed 3000 rad on 04/14/2020     Interim History:  Patrick Bautista is back for his follow-up.  We last saw him 2 months ago.  He is doing better.  He is improving.  The radiation dermatitis in the groin/inguinal region is improving.  He is not nearly as affected.  He is able to do a lot more.  He is started plan his garden.  He has a very extensive garden.  He really enjoys being outside.  He has quite a bit of land that he likes to work on.  He has had no problems with his blood sugars.  They have been on the high side.  However, this morning, his blood sugar was adequate at 150.  He has had no problems with cough or shortness of breath.  He has had no nausea or vomiting.  He has had no rashes.  He has had no leg swelling.  He has had no fever.  Overall, his performance status is ECOG 0.     Medications:  Current Outpatient Medications:  .  acetaminophen (TYLENOL) 500 MG tablet, Take 500-1,000 mg by mouth every 6 (six) hours as needed for moderate pain. , Disp: , Rfl:  .  amLODipine (NORVASC) 2.5 MG tablet, Take 1 tablet (2.5 mg total) by mouth daily. 2.5 mg + 5 mg =7.5 mg total daily dose, Disp: , Rfl:  .  amLODipine (NORVASC) 5 MG tablet, Take 1 tablet (5 mg total) by mouth daily. 2.5 mg + 5 mg =7.5 mg total daily dose, Disp: , Rfl: 11 .  B-D UF III MINI PEN NEEDLES 31G X 5 MM MISC, Inject into the skin 4 (four) times daily., Disp: , Rfl:  .  ergocalciferol (VITAMIN D2) 1.25 MG (50000 UT) capsule, Take 1 capsule (50,000 Units total) by mouth 2 (two) times a week., Disp: 24 capsule, Rfl: 2 .  fluocinonide cream (LIDEX) 8.93 %, Apply 1 application topically 2 (two) times daily as needed  (to access port).  (Patient not taking: No sig reported), Disp: , Rfl:  .  guaiFENesin-dextromethorphan (ROBITUSSIN DM) 100-10 MG/5ML syrup, Take 5 mLs by mouth every 4 (four) hours as needed for cough. (Patient not taking: No sig reported), Disp: 118 mL, Rfl: 0 .  HUMALOG KWIKPEN 100 UNIT/ML KwikPen, Inject 1-5 Units into the skin See admin instructions. Sliding scale : up to 4 times a day. (Patient not taking: No sig reported), Disp: , Rfl:  .  lidocaine-prilocaine (EMLA) cream, Apply to affected area once (Patient not taking: No sig reported), Disp: 30 g, Rfl: 3 .  loratadine (CLARITIN) 10 MG tablet, Take 1 tablet (10 mg total) by mouth See admin instructions. Patient states he takes for 7 days: from 3rd day post chemo until 10th day post chemo, Disp: 28 tablet, Rfl: 0 .  LORazepam (ATIVAN) 0.5 MG tablet, TAKE 1 TABLET BY MOUTH EVERY 6 HOURS AS NEEDED (NAUSEA OR VOMITING). (Patient taking differently: Take 0.5 mg by mouth every 6 (six) hours as needed for anxiety.), Disp: 30 tablet, Rfl: 0 .  olmesartan-hydrochlorothiazide (BENICAR HCT) 40-25 MG tablet, Take 1 tablet by mouth daily., Disp: , Rfl:  .  ONETOUCH VERIO test strip, 2 (two) times daily., Disp: , Rfl:  .  pantoprazole (PROTONIX) 40 MG tablet, TAKE 1 TABLET BY MOUTH EVERY DAY BEFORE BREAKFAST, Disp: 90 tablet, Rfl: 1 .  prochlorperazine (COMPAZINE) 10 MG tablet, Take 1 tablet (10 mg total) by mouth every 6 (six) hours as needed (Nausea or vomiting). (Patient not taking: No sig reported), Disp: 30 tablet, Rfl: 6 .  senna-docusate (SENNA S) 8.6-50 MG tablet, Take 2 tablets by mouth at bedtime as needed for mild constipation or moderate constipation. (Patient not taking: No sig reported), Disp: 60 tablet, Rfl: 1 .  TRESIBA FLEXTOUCH 100 UNIT/ML FlexTouch Pen, Inject 40 Units into the skin daily. , Disp: , Rfl:   Allergies:  Allergies  Allergen Reactions  . Hydrocodone-Acetaminophen Other (See Comments)  . Oxycodone Hcl Other (See Comments)   . Penicillin G Benzathine Other (See Comments)  . Codeine Itching  . Hydrocodone Nausea And Vomiting  . Penicillins Itching    Has patient had a PCN reaction causing immediate rash, facial/tongue/throat swelling, SOB or lightheadedness with hypotension: No Has patient had a PCN reaction causing severe rash involving mucus membranes or skin necrosis: No Has patient had a PCN reaction that required hospitalization No Has patient had a PCN reaction occurring within the last 10 years: No If all of the above answers are "NO", then may proceed with Cephalosporin use.   Marland Kitchen Percocet [Oxycodone-Acetaminophen] Nausea And Vomiting  . Tape Itching and Rash     Paper Tape causes blisters; please use Coban wrap if possible    Past Medical History, Surgical history, Social history, and Family History were reviewed and updated.  Review of Systems: Review of Systems  Constitutional: Negative.   HENT:  Negative.   Eyes: Negative.   Respiratory: Negative.   Cardiovascular: Negative.   Gastrointestinal: Negative.   Endocrine: Negative.   Genitourinary: Negative.    Musculoskeletal: Negative.   Skin: Negative.   Neurological: Negative.   Hematological: Negative.   Psychiatric/Behavioral: Negative.     Physical Exam:  weight is 221 lb (100.2 kg). His oral temperature is 98.2 F (36.8 C). His blood pressure is 104/66 and his pulse is 88. His respiration is 18 and oxygen saturation is 99%.   Wt Readings from Last 3 Encounters:  08/11/20 221 lb (100.2 kg)  06/23/20 218 lb (98.9 kg)  05/17/20 217 lb 6 oz (98.6 kg)    Physical Exam Vitals reviewed.  HENT:     Head: Normocephalic and atraumatic.  Eyes:     Pupils: Pupils are equal, round, and reactive to light.  Cardiovascular:     Rate and Rhythm: Normal rate and regular rhythm.     Heart sounds: Normal heart sounds.  Pulmonary:     Effort: Pulmonary effort is normal.     Breath sounds: Normal breath sounds.  Abdominal:     General:  Bowel sounds are normal.     Palpations: Abdomen is soft.  Musculoskeletal:        General: No tenderness or deformity. Normal range of motion.     Cervical back: Normal range of motion.  Lymphadenopathy:     Cervical: No cervical adenopathy.  Skin:    General: Skin is warm and dry.     Findings: No erythema or rash.  Neurological:     Mental Status: He is alert and oriented to person, place, and time.  Psychiatric:        Behavior: Behavior normal.  Thought Content: Thought content normal.        Judgment: Judgment normal.      Lab Results  Component Value Date   WBC 7.4 08/11/2020   HGB 12.7 (L) 08/11/2020   HCT 38.1 (L) 08/11/2020   MCV 88.8 08/11/2020   PLT 306 08/11/2020     Chemistry      Component Value Date/Time   NA 138 06/23/2020 0822   K 4.4 06/23/2020 0822   CL 101 06/23/2020 0822   CO2 30 06/23/2020 0822   BUN 28 (H) 06/23/2020 0822   CREATININE 1.53 (H) 06/23/2020 0822      Component Value Date/Time   CALCIUM 10.4 (H) 06/23/2020 0822   ALKPHOS 86 06/23/2020 0822   AST 23 06/23/2020 0822   ALT 25 06/23/2020 0822   BILITOT 0.5 06/23/2020 1388      Impression and Plan: Patrick Bautista is a very nice 70 year old white male.  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  He had this removed in May.  He then underwent systemic chemotherapy along with intrathecal methotrexate.  He then underwent inguinal radiation therapy.  So far, everything looks fantastic.  I do not see any evidence of recurrent disease.  I do not see that we have to do a PET scan on him.  We will plan to get her back in 3 months now.  I am just happy that his quality of life is improving with each visit.    Volanda Napoleon, MD 4/20/20229:27 AM

## 2020-08-11 NOTE — Telephone Encounter (Signed)
Pt to rec sch in tx/avs in office and through my chart(Saheed Carrington remote)

## 2020-08-23 DIAGNOSIS — L570 Actinic keratosis: Secondary | ICD-10-CM | POA: Diagnosis not present

## 2020-08-23 DIAGNOSIS — C44329 Squamous cell carcinoma of skin of other parts of face: Secondary | ICD-10-CM | POA: Diagnosis not present

## 2020-09-08 ENCOUNTER — Telehealth: Payer: Self-pay | Admitting: Radiology
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
## 2020-09-09 ENCOUNTER — Telehealth: Payer: Self-pay | Admitting: *Deleted

## 2020-09-09 NOTE — Telephone Encounter (Signed)
CALLED PATIENT TO ASK ABOUT COMING IN FOR A FU, PATIENT AGREED TO DO SO ON 09-16-20 @ 8:45 AM

## 2020-09-10 ENCOUNTER — Encounter: Payer: Self-pay | Admitting: Radiology

## 2020-09-14 DIAGNOSIS — C44329 Squamous cell carcinoma of skin of other parts of face: Secondary | ICD-10-CM | POA: Diagnosis not present

## 2020-09-15 NOTE — Progress Notes (Signed)
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 The patient was last seen for follow-up on 05/17/2020. Since his last visit, the patient received a PET scan on 06/15/2020. Results from PET showed no evidence of hypermetabolic lymphoma, and no enlarged or hypermetabolic lymph nodes or recurrent hypermetabolic adrenal lesions.  It is worth noting that results also showed that the right lower lobe pulmonary nodule was 5 mm, a slight increase from it's last measurement of 4 mm.  The patient followed up with Dr. Marin Olp on 06/23/20. 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 The patient will follow-up with Dr. Marin Olp in two months.   Requested to be seen for evaluation of discomfort along the distal penis/foreskin area.  He has seen urologist concerning issue.  Patient is able to obtain erections and ejaculation although he is very sensitive in this area with intercourse.  He denies having the foreskin not being able to be retracted.  Patient was given a steroid cream to apply to the penis region which has been helpful.  This was given to him by his urologist.  He is now engaged to be married but no date has been set up.   ALLERGIES:  is allergic to hydrocodone-acetaminophen, oxycodone hcl, penicillin g benzathine, codeine, hydrocodone, penicillins, percocet  [oxycodone-acetaminophen], and tape.  Meds: Current Outpatient Medications  Medication Sig Dispense Refill  . acetaminophen (TYLENOL) 500 MG tablet Take 500-1,000 mg by mouth every 6 (six) hours as needed for moderate pain.     Marland Kitchen amLODipine (NORVASC) 2.5 MG tablet Take 1 tablet (2.5 mg total) by mouth daily. 2.5 mg + 5 mg =7.5 mg total daily dose    . amLODipine (NORVASC) 5 MG tablet Take 1 tablet (5 mg total) by mouth daily. 2.5 mg + 5 mg =7.5 mg total daily dose  11  . B-D UF III MINI PEN NEEDLES 31G X 5 MM MISC Inject into the skin 4 (four) times daily.    . clotrimazole-betamethasone (LOTRISONE) cream SMARTSIG:1 Sparingly Topical Twice Daily    . doxycycline (VIBRAMYCIN) 100 MG capsule Take 100 mg by mouth 2 (two) times daily.    Marland Kitchen HUMALOG KWIKPEN 100 UNIT/ML KwikPen Inject 1-5 Units into the skin See admin instructions. Sliding scale : up to 4 times a day.    . olmesartan-hydrochlorothiazide (BENICAR HCT) 40-25 MG tablet Take 1 tablet by mouth daily.    Glory Rosebush VERIO test strip 2 (two) times daily.    . pantoprazole (PROTONIX) 40 MG tablet TAKE 1 TABLET BY MOUTH EVERY DAY BEFORE BREAKFAST 90 tablet 1  . TRESIBA FLEXTOUCH 100 UNIT/ML FlexTouch Pen Inject 40 Units into the skin daily.     . ergocalciferol (VITAMIN D2) 1.25 MG (50000 UT) capsule Take 1 capsule (50,000 Units total) by mouth 2 (two) times a week. (Patient not taking: Reported on 09/16/2020)  24 capsule 2  . fluocinonide cream (LIDEX) 8.31 % Apply 1 application topically 2 (two) times daily as needed (to access port).  (Patient not taking: No sig reported)    . guaiFENesin-dextromethorphan (ROBITUSSIN DM) 100-10 MG/5ML syrup Take 5 mLs by mouth every 4 (four) hours as needed for cough. (Patient not taking: No sig reported) 118 mL 0  . lidocaine-prilocaine (EMLA) cream Apply to affected area once (Patient not taking: No sig reported) 30 g 3  . loratadine (CLARITIN) 10 MG tablet Take 1 tablet (10 mg total) by mouth See admin  instructions. Patient states he takes for 7 days: from 3rd day post chemo until 10th day post chemo (Patient not taking: Reported on 09/16/2020) 28 tablet 0  . LORazepam (ATIVAN) 0.5 MG tablet TAKE 1 TABLET BY MOUTH EVERY 6 HOURS AS NEEDED (NAUSEA OR VOMITING). (Patient not taking: Reported on 09/16/2020) 30 tablet 0  . prochlorperazine (COMPAZINE) 10 MG tablet Take 1 tablet (10 mg total) by mouth every 6 (six) hours as needed (Nausea or vomiting). (Patient not taking: No sig reported) 30 tablet 6  . senna-docusate (SENNA S) 8.6-50 MG tablet Take 2 tablets by mouth at bedtime as needed for mild constipation or moderate constipation. (Patient not taking: No sig reported) 60 tablet 1   No current facility-administered medications for this encounter.    Physical Findings: The patient is in no acute distress. Patient is alert and oriented.  height is 5\' 10"  (1.778 m) and weight is 224 lb (101.6 kg). His temperature is 97.7 F (36.5 C). His blood pressure is 110/75 and his pulse is 81. His respiration is 20 and oxygen saturation is 100%.    Lungs are clear to auscultation bilaterally. Heart has regular rate and rhythm. No palpable cervical, supraclavicular, or axillary adenopathy. Abdomen soft, non-tender, normal bowel sounds.  No inguinal adenopathy appreciated.  The patient's skin along the upper thigh and scrotum area is healed well at this time.   Mild swelling is noted along the scrotal sac.  No   palpable mass  of the remaining testicle .  No mass within the scrotal sac detected.  No significant reaction noted along the penis at this time.  The skin along the region of the foreskin and shaft of the penis is somewhat dry.  There is some mild erythema but no signs of infection in this area.     Lab Findings: Lab Results  Component Value Date   WBC 7.4 08/11/2020   HGB 12.7 (L) 08/11/2020   HCT 38.1 (L) 08/11/2020   MCV 88.8 08/11/2020   PLT 306 08/11/2020    Radiographic Findings: No results  found.  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  Patient has residual discomfort in the region of the distal penis from his radiation therapy to the scrotal sac.  We attempted to tape the penis out of the treatment area as much as possible but was not technically feasible given his anatomy.  The area of irritation has improved significantly since my last exam.  I recommended the use Aquaphor in this area with skin being dry.  He will continue to use the steroid cream as recommended by his urologist.  Also recommended he use a good lubricant with intercourse.  Plan: As needed follow-up in radiation oncology.  The patient will continue close follow-up with his urologist and with Dr. Marin Olp.  ____________________________________   Nelda Severe.  Sondra Come, PhD, MD  This document serves as a record of services personally performed by Gery Pray, MD. It was created on his behalf by Roney Mans, a trained medical scribe. The creation of this record is based on the scribe's personal observations and the provider's statements to them. This document has been checked and approved by the attending provider.

## 2020-09-16 ENCOUNTER — Encounter: Payer: Self-pay | Admitting: Radiation Oncology

## 2020-09-16 ENCOUNTER — Ambulatory Visit
Admission: RE | Admit: 2020-09-16 | Discharge: 2020-09-16 | Disposition: A | Discharge status: Home / Self Care | Payer: Medicare Other | Source: Ambulatory Visit | Attending: Radiation Oncology | Admitting: Radiation Oncology

## 2020-09-16 ENCOUNTER — Other Ambulatory Visit: Payer: Self-pay

## 2020-09-16 VITALS — BP 110/75 | HR 81 | Temp 97.7°F | Resp 20 | Ht 70.0 in | Wt 224.0 lb

## 2020-09-16 DIAGNOSIS — C8339 Diffuse large B-cell lymphoma, extranodal and solid organ sites: Secondary | ICD-10-CM | POA: Diagnosis not present

## 2020-09-16 DIAGNOSIS — Z8572 Personal history of non-Hodgkin lymphomas: Secondary | ICD-10-CM | POA: Insufficient documentation

## 2020-09-16 DIAGNOSIS — L598 Other specified disorders of the skin and subcutaneous tissue related to radiation: Secondary | ICD-10-CM | POA: Diagnosis not present

## 2020-09-16 DIAGNOSIS — Z79899 Other long term (current) drug therapy: Secondary | ICD-10-CM | POA: Diagnosis not present

## 2020-09-16 DIAGNOSIS — R918 Other nonspecific abnormal finding of lung field: Secondary | ICD-10-CM | POA: Diagnosis not present

## 2020-09-16 DIAGNOSIS — C8330 Diffuse large B-cell lymphoma, unspecified site: Secondary | ICD-10-CM

## 2020-09-16 DIAGNOSIS — Y842 Radiological procedure and radiotherapy as the cause of abnormal reaction of the patient, or of later complication, without mention of misadventure at the time of the procedure: Secondary | ICD-10-CM | POA: Insufficient documentation

## 2020-09-16 DIAGNOSIS — Z08 Encounter for follow-up examination after completed treatment for malignant neoplasm: Secondary | ICD-10-CM | POA: Diagnosis not present

## 2020-09-16 HISTORY — DX: Unspecified malignant neoplasm of skin, unspecified: C44.90

## 2020-09-16 NOTE — Progress Notes (Signed)
Patrick Bautista is here today for follow up post radiation to the pelvic.  They completed their radiation on: 04/14/2020   Does the patient complain of any of the following:  . Pain:0 . Abdominal bloating: denies . Diarrhea/Constipation: denies . Nausea/Vomiting: denies . Blood in Urine or Stool: denies . Urinary Issues (dysuria/incomplete emptying/ incontinence/ increased frequency/urgency): Reports nocturia x 0-1 nightly. Denies any other urinary symptoms . Post radiation skin changes: skin irritation on penis, foreskin is tight    Additional comments if applicable: none   Vitals:   09/16/20 0842  BP: 110/75  Pulse: 81  Resp: 20  Temp: 97.7 F (36.5 C)  SpO2: 100%  Weight: 224 lb (101.6 kg)  Height: 5\' 10"  (1.778 m)

## 2020-09-21 DIAGNOSIS — L570 Actinic keratosis: Secondary | ICD-10-CM | POA: Diagnosis not present

## 2020-10-11 DIAGNOSIS — J0100 Acute maxillary sinusitis, unspecified: Secondary | ICD-10-CM | POA: Diagnosis not present

## 2020-10-14 DIAGNOSIS — J208 Acute bronchitis due to other specified organisms: Secondary | ICD-10-CM | POA: Diagnosis not present

## 2020-11-10 ENCOUNTER — Other Ambulatory Visit: Payer: Self-pay

## 2020-11-10 ENCOUNTER — Inpatient Hospital Stay: Payer: Medicare Other | Attending: Hematology

## 2020-11-10 ENCOUNTER — Encounter: Payer: Self-pay | Admitting: Hematology & Oncology

## 2020-11-10 ENCOUNTER — Inpatient Hospital Stay: Payer: Medicare Other | Admitting: Hematology & Oncology

## 2020-11-10 ENCOUNTER — Telehealth: Payer: Self-pay | Admitting: *Deleted

## 2020-11-10 VITALS — BP 118/64 | HR 86 | Temp 97.6°F | Resp 18 | Wt 218.0 lb

## 2020-11-10 DIAGNOSIS — C8333 Diffuse large B-cell lymphoma, intra-abdominal lymph nodes: Secondary | ICD-10-CM

## 2020-11-10 DIAGNOSIS — Z885 Allergy status to narcotic agent status: Secondary | ICD-10-CM | POA: Insufficient documentation

## 2020-11-10 DIAGNOSIS — Z888 Allergy status to other drugs, medicaments and biological substances status: Secondary | ICD-10-CM | POA: Insufficient documentation

## 2020-11-10 DIAGNOSIS — C8339 Diffuse large B-cell lymphoma, extranodal and solid organ sites: Secondary | ICD-10-CM | POA: Insufficient documentation

## 2020-11-10 DIAGNOSIS — L598 Other specified disorders of the skin and subcutaneous tissue related to radiation: Secondary | ICD-10-CM | POA: Insufficient documentation

## 2020-11-10 DIAGNOSIS — Z79899 Other long term (current) drug therapy: Secondary | ICD-10-CM | POA: Insufficient documentation

## 2020-11-10 DIAGNOSIS — Z880 Allergy status to penicillin: Secondary | ICD-10-CM | POA: Diagnosis not present

## 2020-11-10 LAB — CBC WITH DIFFERENTIAL (CANCER CENTER ONLY)
Abs Immature Granulocytes: 0.02 10*3/uL (ref 0.00–0.07)
Basophils Absolute: 0 10*3/uL (ref 0.0–0.1)
Basophils Relative: 0 %
Eosinophils Absolute: 0.2 10*3/uL (ref 0.0–0.5)
Eosinophils Relative: 2 %
HCT: 36.8 % — ABNORMAL LOW (ref 39.0–52.0)
Hemoglobin: 12.3 g/dL — ABNORMAL LOW (ref 13.0–17.0)
Immature Granulocytes: 0 %
Lymphocytes Relative: 25 %
Lymphs Abs: 2 10*3/uL (ref 0.7–4.0)
MCH: 29.4 pg (ref 26.0–34.0)
MCHC: 33.4 g/dL (ref 30.0–36.0)
MCV: 87.8 fL (ref 80.0–100.0)
Monocytes Absolute: 0.8 10*3/uL (ref 0.1–1.0)
Monocytes Relative: 9 %
Neutro Abs: 5.2 10*3/uL (ref 1.7–7.7)
Neutrophils Relative %: 64 %
Platelet Count: 316 10*3/uL (ref 150–400)
RBC: 4.19 MIL/uL — ABNORMAL LOW (ref 4.22–5.81)
RDW: 13.8 % (ref 11.5–15.5)
WBC Count: 8.2 10*3/uL (ref 4.0–10.5)
nRBC: 0 % (ref 0.0–0.2)

## 2020-11-10 LAB — CMP (CANCER CENTER ONLY)
ALT: 23 U/L (ref 0–44)
AST: 23 U/L (ref 15–41)
Albumin: 4.7 g/dL (ref 3.5–5.0)
Alkaline Phosphatase: 95 U/L (ref 38–126)
Anion gap: 9 (ref 5–15)
BUN: 38 mg/dL — ABNORMAL HIGH (ref 8–23)
CO2: 25 mmol/L (ref 22–32)
Calcium: 10.3 mg/dL (ref 8.9–10.3)
Chloride: 103 mmol/L (ref 98–111)
Creatinine: 1.55 mg/dL — ABNORMAL HIGH (ref 0.61–1.24)
GFR, Estimated: 48 mL/min — ABNORMAL LOW (ref >60)
Glucose, Bld: 184 mg/dL — ABNORMAL HIGH (ref 70–99)
Potassium: 4.5 mmol/L (ref 3.5–5.1)
Sodium: 137 mmol/L (ref 135–145)
Total Bilirubin: 0.6 mg/dL (ref 0.3–1.2)
Total Protein: 7.6 g/dL (ref 6.5–8.1)

## 2020-11-10 LAB — LACTATE DEHYDROGENASE: LDH: 148 U/L (ref 98–192)

## 2020-11-10 NOTE — Telephone Encounter (Signed)
Per 11/10/20 los - mailed printed calendar

## 2020-11-10 NOTE — Progress Notes (Signed)
Hematology and Oncology Follow Up Visit  Patrick Bautista [id number] Aug 07, 1950 70 y.o. 11/10/2020   Principle Diagnosis:  Primary large B-cell diffuse lymphoma of the LEFT testicle  Current Therapy:   S/p R-CHOP x 5 cycles - completed in 12/2019 S/p IT MTX x 2 --- last dose on 12/26/2019 S/P XRT -- completed 3000 rad on 04/14/2020     Interim History:  Patrick Bautista is back for his follow-up.  We saw him 3 months ago.  He is in quite busy this summer.  He has a large tomato garden.  He is estimated to a lot of his neighbors and to church members.  He actually brought cement for [place].  They absolutely look delicious.  I am sure that we will enjoy eating them.  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  He is still able to enjoy "relations" with his wife.  He sees urology.  They are doing a good job in trying to manage this.  He has had no problems with cough or shortness of breath.  He has had no change in bowel or bladder habits.  He has had no rashes.  There is been no leg swelling.  He has had no bleeding.  Overall, his performance status is ECOG 0.      Medications:  Current Outpatient Medications:    acetaminophen (TYLENOL) 500 MG tablet, Take 500-1,000 mg by mouth every 6 (six) hours as needed for moderate pain. , Disp: , Rfl:    Acetaminophen-Codeine 300-30 MG tablet, Take 1 tablet by mouth 4 (four) times daily as needed., Disp: , Rfl:    amLODipine (NORVASC) 2.5 MG tablet, Take 1 tablet (2.5 mg total) by mouth daily. 2.5 mg + 5 mg =7.5 mg total daily dose, Disp: , Rfl:    amLODipine (NORVASC) 5 MG tablet, Take 1 tablet (5 mg total) by mouth daily. 2.5 mg + 5 mg =7.5 mg total daily dose, Disp: , Rfl: 11   B-D UF III MINI PEN NEEDLES 31G X 5 MM MISC, Inject into the skin 4 (four) times daily., Disp: , Rfl:    clindamycin (CLEOCIN) 150 MG capsule, Take 150 mg by mouth every 6 (six) hours., Disp: , Rfl:    clotrimazole-betamethasone (LOTRISONE) cream,  SMARTSIG:1 Sparingly Topical Twice Daily, Disp: , Rfl:    ergocalciferol (VITAMIN D2) 1.25 MG (50000 UT) capsule, Take 1 capsule (50,000 Units total) by mouth 2 (two) times a week. (Patient not taking: Reported on 09/16/2020), Disp: 24 capsule, Rfl: 2   fluocinonide cream (LIDEX) 5.36 %, Apply 1 application topically 2 (two) times daily as needed (to access port).  (Patient not taking: No sig reported), Disp: , Rfl:    guaiFENesin-dextromethorphan (ROBITUSSIN DM) 100-10 MG/5ML syrup, Take 5 mLs by mouth every 4 (four) hours as needed for cough. (Patient not taking: No sig reported), Disp: 118 mL, Rfl: 0   HUMALOG KWIKPEN 100 UNIT/ML KwikPen, Inject 1-5 Units into the skin See admin instructions. Sliding scale : up to 4 times a day., Disp: , Rfl:    lidocaine-prilocaine (EMLA) cream, Apply to affected area once (Patient not taking: No sig reported), Disp: 30 g, Rfl: 3   loratadine (CLARITIN) 10 MG tablet, Take 1 tablet (10 mg total) by mouth See admin instructions. Patient states he takes for 7 days: from 3rd day post chemo until 10th day post chemo (Patient not taking: Reported on 09/16/2020), Disp: 28 tablet, Rfl: 0   LORazepam (ATIVAN) 0.5  MG tablet, TAKE 1 TABLET BY MOUTH EVERY 6 HOURS AS NEEDED (NAUSEA OR VOMITING). (Patient not taking: Reported on 09/16/2020), Disp: 30 tablet, Rfl: 0   olmesartan-hydrochlorothiazide (BENICAR HCT) 40-25 MG tablet, Take 1 tablet by mouth daily., Disp: , Rfl:    ONETOUCH VERIO test strip, 2 (two) times daily., Disp: , Rfl:    pantoprazole (PROTONIX) 40 MG tablet, TAKE 1 TABLET BY MOUTH EVERY DAY BEFORE BREAKFAST, Disp: 90 tablet, Rfl: 1   prochlorperazine (COMPAZINE) 10 MG tablet, Take 1 tablet (10 mg total) by mouth every 6 (six) hours as needed (Nausea or vomiting). (Patient not taking: No sig reported), Disp: 30 tablet, Rfl: 6   senna-docusate (SENNA S) 8.6-50 MG tablet, Take 2 tablets by mouth at bedtime as needed for mild constipation or moderate constipation.  (Patient not taking: No sig reported), Disp: 60 tablet, Rfl: 1   traMADol (ULTRAM) 50 MG tablet, Take 50 mg by mouth every 6 (six) hours as needed., Disp: , Rfl:    TRESIBA FLEXTOUCH 100 UNIT/ML FlexTouch Pen, Inject 40 Units into the skin daily. , Disp: , Rfl:   Allergies:  Allergies  Allergen Reactions   Hydrocodone-Acetaminophen Other (See Comments)   Oxycodone Hcl Other (See Comments)   Penicillin G Benzathine Other (See Comments)   Codeine Itching   Hydrocodone Nausea And Vomiting   Penicillins Itching    Has patient had a PCN reaction causing immediate rash, facial/tongue/throat swelling, SOB or lightheadedness with hypotension: No Has patient had a PCN reaction causing severe rash involving mucus membranes or skin necrosis: No Has patient had a PCN reaction that required hospitalization No Has patient had a PCN reaction occurring within the last 10 years: No If all of the above answers are "NO", then may proceed with Cephalosporin use.    Percocet [Oxycodone-Acetaminophen] Nausea And Vomiting   Tape Itching and Rash     Paper Tape causes blisters; please use Coban wrap if possible    Past Medical History, Surgical history, Social history, and Family History were reviewed and updated.  Review of Systems: Review of Systems  Constitutional: Negative.   HENT:  Negative.    Eyes: Negative.   Respiratory: Negative.    Cardiovascular: Negative.   Gastrointestinal: Negative.   Endocrine: Negative.   Genitourinary: Negative.    Musculoskeletal: Negative.   Skin: Negative.   Neurological: Negative.   Hematological: Negative.   Psychiatric/Behavioral: Negative.     Physical Exam:  weight is 218 lb (98.9 kg). His oral temperature is 97.6 F (36.4 C). His blood pressure is 118/64 and his pulse is 86. His respiration is 18 and oxygen saturation is 100%.   Wt Readings from Last 3 Encounters:  11/10/20 218 lb (98.9 kg)  09/16/20 224 lb (101.6 kg)  08/11/20 221 lb (100.2 kg)     Physical Exam Vitals reviewed.  HENT:     Head: Normocephalic and atraumatic.  Eyes:     Pupils: Pupils are equal, round, and reactive to light.  Cardiovascular:     Rate and Rhythm: Normal rate and regular rhythm.     Heart sounds: Normal heart sounds.  Pulmonary:     Effort: Pulmonary effort is normal.     Breath sounds: Normal breath sounds.  Abdominal:     General: Bowel sounds are normal.     Palpations: Abdomen is soft.  Musculoskeletal:        General: No tenderness or deformity. Normal range of motion.     Cervical back: Normal range  of motion.  Lymphadenopathy:     Cervical: No cervical adenopathy.  Skin:    General: Skin is warm and dry.     Findings: No erythema or rash.  Neurological:     Mental Status: He is alert and oriented to person, place, and time.  Psychiatric:        Behavior: Behavior normal.        Thought Content: Thought content normal.        Judgment: Judgment normal.     Lab Results  Component Value Date   WBC 8.2 11/10/2020   HGB 12.3 (L) 11/10/2020   HCT 36.8 (L) 11/10/2020   MCV 87.8 11/10/2020   PLT 316 11/10/2020     Chemistry      Component Value Date/Time   NA 137 11/10/2020 0900   K 4.5 11/10/2020 0900   CL 103 11/10/2020 0900   CO2 25 11/10/2020 0900   BUN 38 (H) 11/10/2020 0900   CREATININE 1.55 (H) 11/10/2020 0900      Component Value Date/Time   CALCIUM 10.3 11/10/2020 0900   ALKPHOS 95 11/10/2020 0900   AST 23 11/10/2020 0900   ALT 23 11/10/2020 0900   BILITOT 0.6 11/10/2020 0900      Impression and Plan: Patrick Bautista is a very nice 70 year old white male.  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  He had this removed in May.  He then underwent systemic chemotherapy along with intrathecal methotrexate.  He then underwent inguinal radiation therapy.  So far, everything looks fantastic.  I do not see any evidence of recurrent disease.  I do not see that we have to do a PET scan on him.  We will  now plan to get him back in 4 months.  We will get him back right before the holidays.   Volanda Napoleon, MD 7/20/202210:56 AM

## 2020-12-06 IMAGING — US US SOFT TISSUE
1 series · 14 of 14 positions shown · non-contrast
Comparison: None.

CLINICAL DATA: Infection due to Port-A-Cath.

EXAM:
ULTRASOUND OF HEAD/NECK SOFT TISSUES
TECHNIQUE: Ultrasound examination of the head and neck soft tissues was
performed in the area of clinical concern.

[Series 1: us soft tissue · 14 acquisitions, 14 frames shown]
[im 1/14]
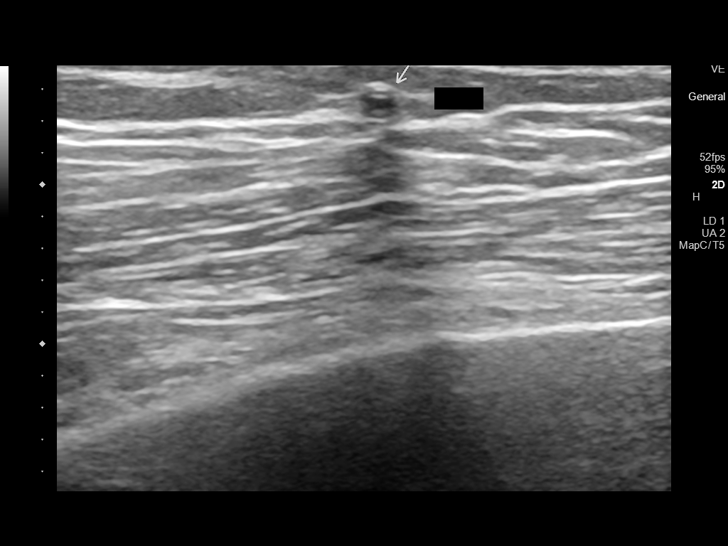
[im 2/14]
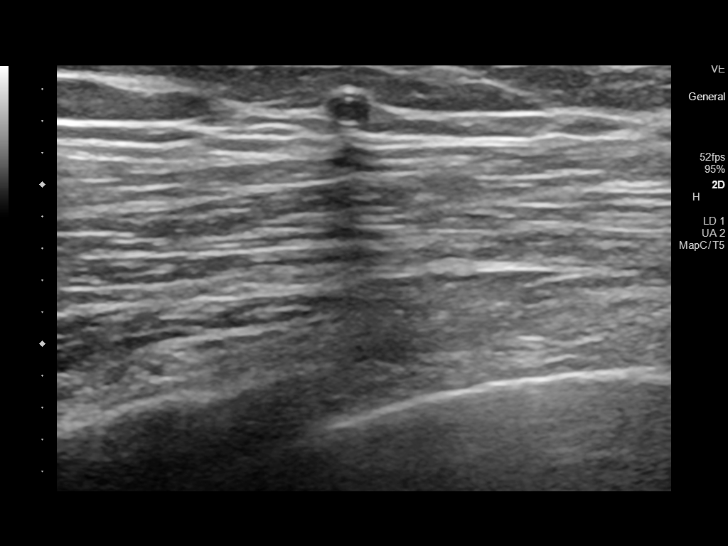
[im 3/14]
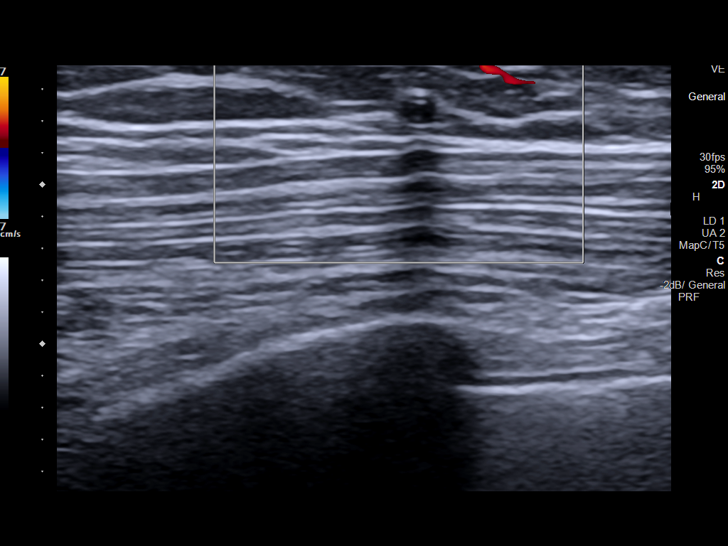
[im 4/14]
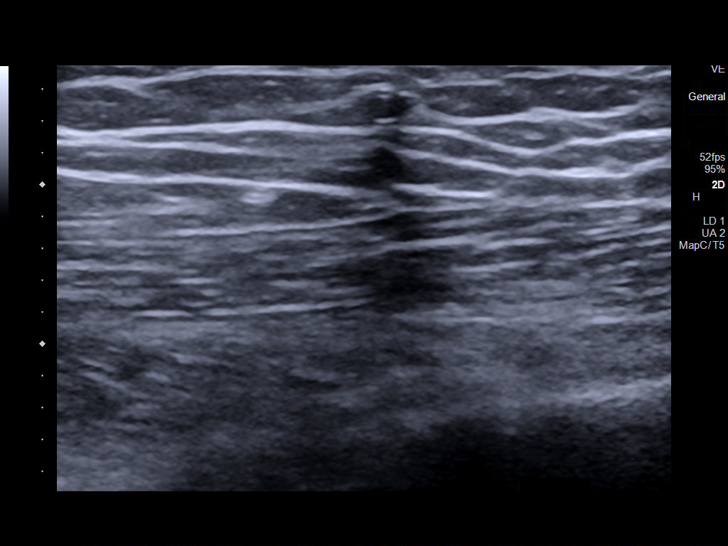
[im 5/14]
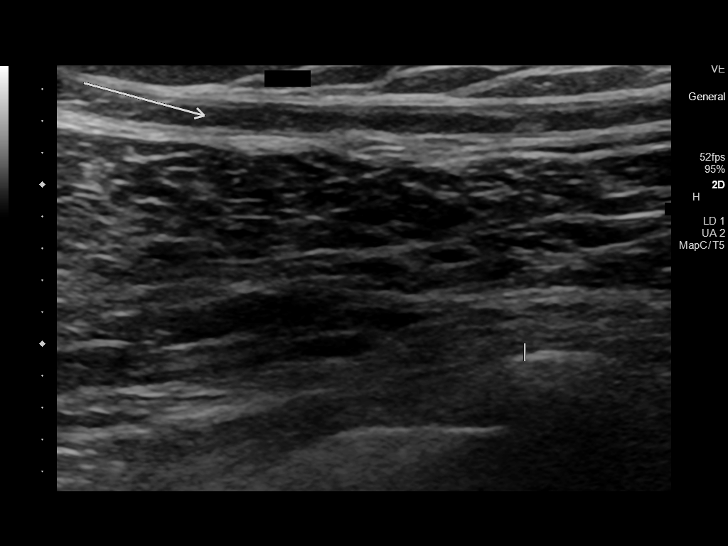
[im 6/14]
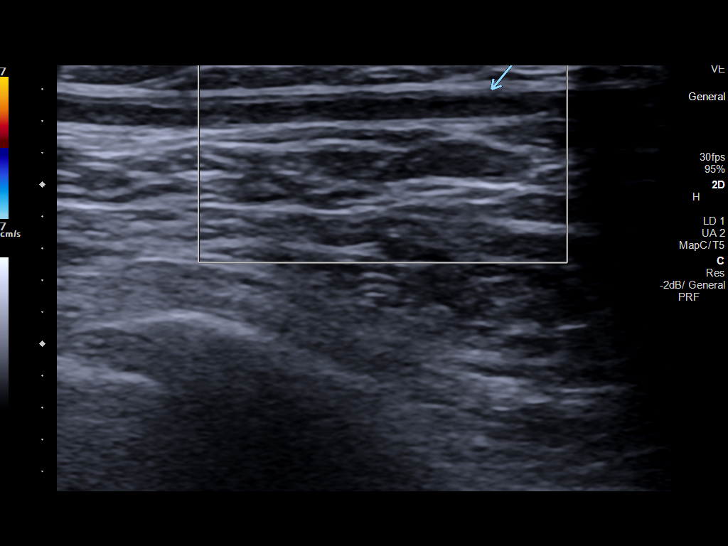
[im 7/14]
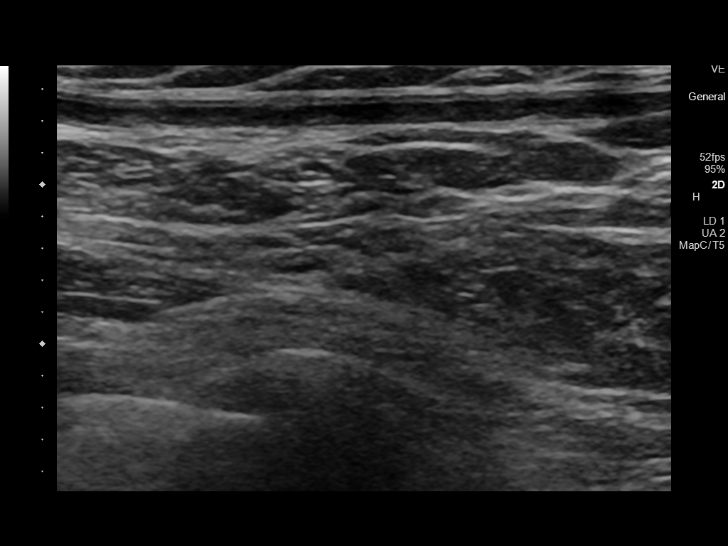
[im 8/14]
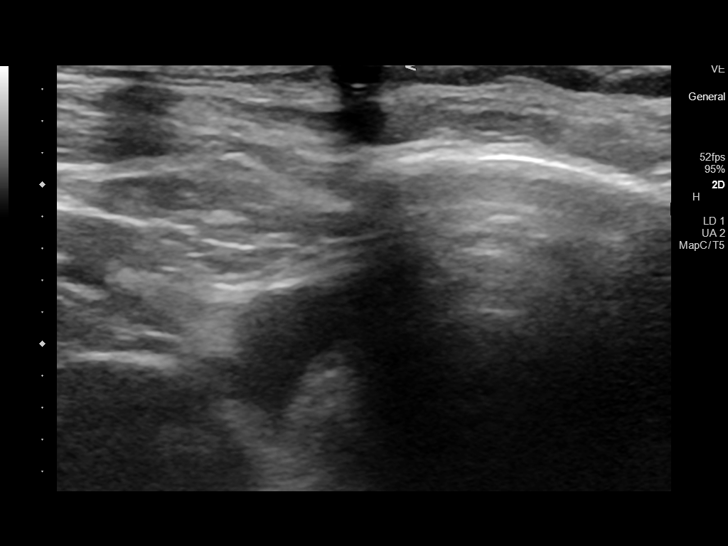
[im 9/14]
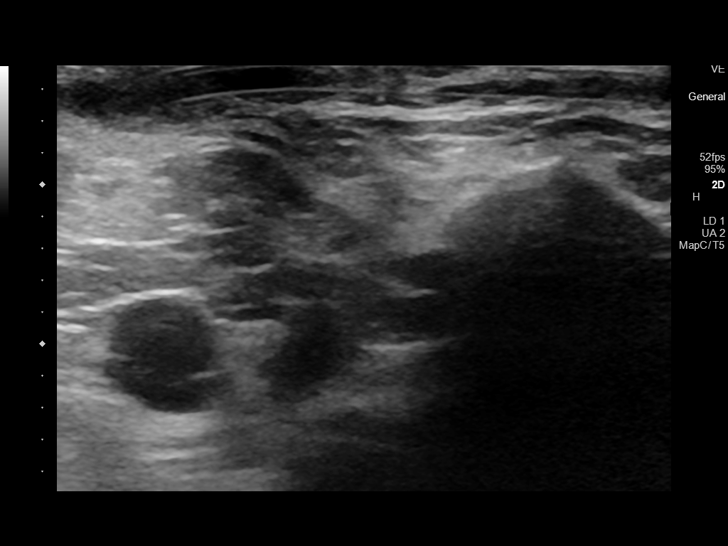
[im 10/14]
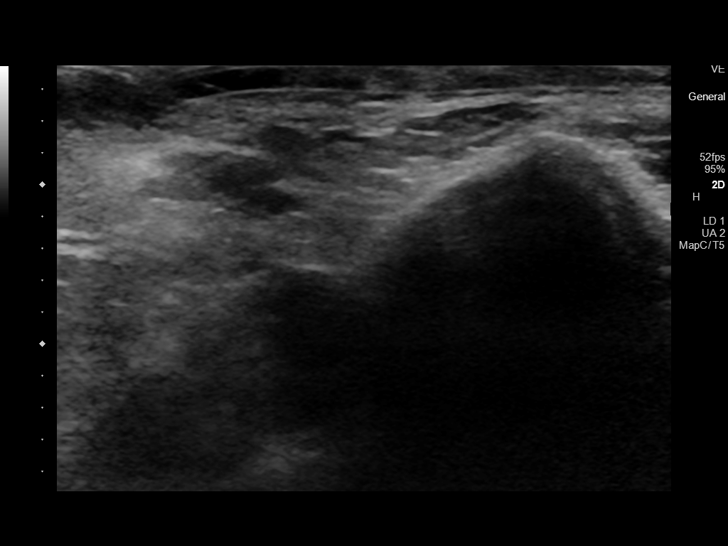
[im 11/14]
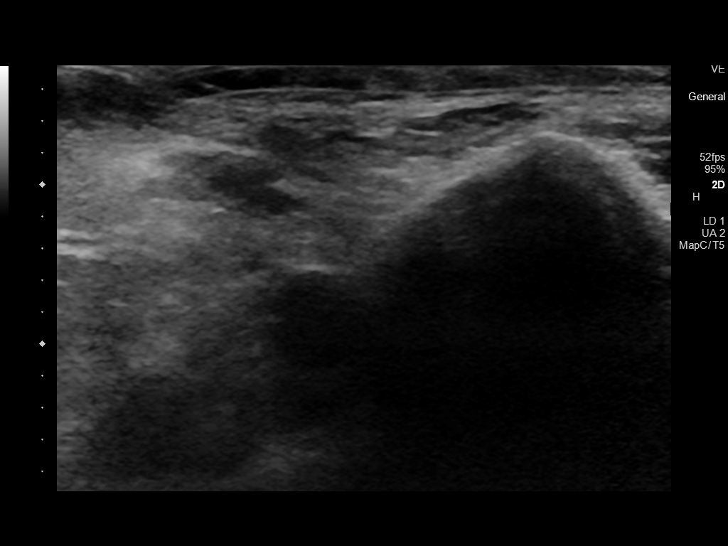
[im 12/14]
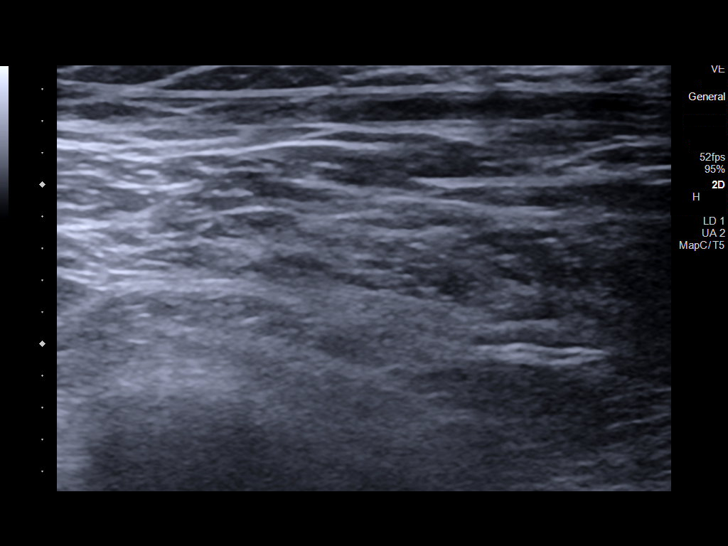
[im 13/14]
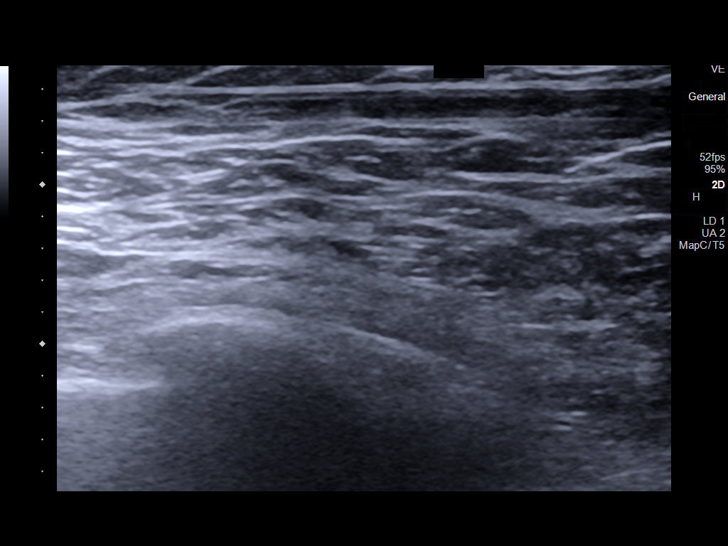
[im 14/14]
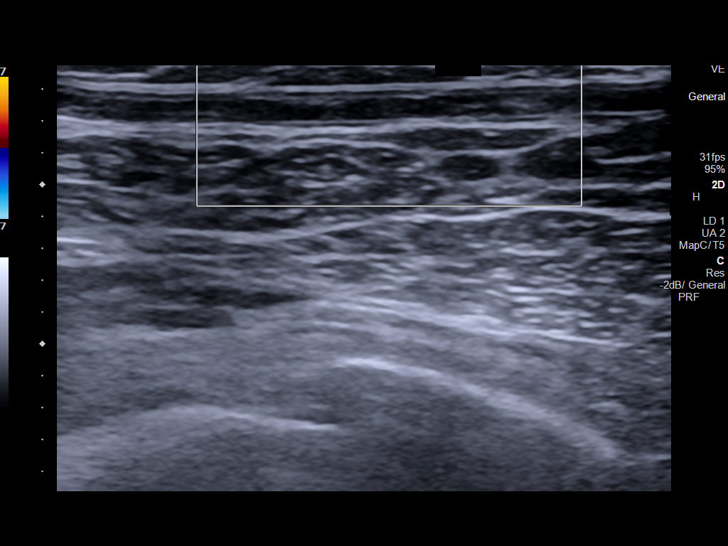

[14 of 14 positions shown; findings below may reference images not displayed]

FINDINGS: Targeted sonographic evaluation in the area of clinical concern in
the right chest, area around the port through the nipple were
scanned. No evidence of fluid collection adjacent to the port or
catheter. No significant skin thickening or edema.
IMPRESSION: No evidence of fluid collection in the right chest wall related to
chest port.

## 2021-01-08 DIAGNOSIS — U071 COVID-19: Secondary | ICD-10-CM | POA: Diagnosis not present

## 2021-01-15 ENCOUNTER — Emergency Department (HOSPITAL_COMMUNITY)
Admission: EM | Admit: 2021-01-15 | Discharge: 2021-01-15 | Disposition: A | Discharge status: Home / Self Care | Payer: Medicare Other | Attending: Emergency Medicine | Admitting: Emergency Medicine

## 2021-01-15 ENCOUNTER — Emergency Department (HOSPITAL_COMMUNITY): Payer: Medicare Other

## 2021-01-15 ENCOUNTER — Encounter (HOSPITAL_COMMUNITY): Payer: Self-pay

## 2021-01-15 ENCOUNTER — Other Ambulatory Visit: Payer: Self-pay

## 2021-01-15 DIAGNOSIS — S0990XA Unspecified injury of head, initial encounter: Secondary | ICD-10-CM | POA: Diagnosis present

## 2021-01-15 DIAGNOSIS — I959 Hypotension, unspecified: Secondary | ICD-10-CM | POA: Diagnosis not present

## 2021-01-15 DIAGNOSIS — X58XXXA Exposure to other specified factors, initial encounter: Secondary | ICD-10-CM | POA: Insufficient documentation

## 2021-01-15 DIAGNOSIS — E119 Type 2 diabetes mellitus without complications: Secondary | ICD-10-CM | POA: Insufficient documentation

## 2021-01-15 DIAGNOSIS — Z743 Need for continuous supervision: Secondary | ICD-10-CM | POA: Diagnosis not present

## 2021-01-15 DIAGNOSIS — Z79899 Other long term (current) drug therapy: Secondary | ICD-10-CM | POA: Insufficient documentation

## 2021-01-15 DIAGNOSIS — R42 Dizziness and giddiness: Secondary | ICD-10-CM | POA: Diagnosis not present

## 2021-01-15 DIAGNOSIS — Z85828 Personal history of other malignant neoplasm of skin: Secondary | ICD-10-CM | POA: Diagnosis not present

## 2021-01-15 DIAGNOSIS — E860 Dehydration: Secondary | ICD-10-CM | POA: Diagnosis not present

## 2021-01-15 DIAGNOSIS — R0902 Hypoxemia: Secondary | ICD-10-CM | POA: Diagnosis not present

## 2021-01-15 DIAGNOSIS — I10 Essential (primary) hypertension: Secondary | ICD-10-CM | POA: Diagnosis not present

## 2021-01-15 DIAGNOSIS — R6889 Other general symptoms and signs: Secondary | ICD-10-CM | POA: Diagnosis not present

## 2021-01-15 DIAGNOSIS — Z794 Long term (current) use of insulin: Secondary | ICD-10-CM | POA: Insufficient documentation

## 2021-01-15 DIAGNOSIS — R55 Syncope and collapse: Secondary | ICD-10-CM | POA: Diagnosis not present

## 2021-01-15 DIAGNOSIS — R404 Transient alteration of awareness: Secondary | ICD-10-CM | POA: Diagnosis not present

## 2021-01-15 DIAGNOSIS — S0083XA Contusion of other part of head, initial encounter: Secondary | ICD-10-CM | POA: Diagnosis not present

## 2021-01-15 DIAGNOSIS — E162 Hypoglycemia, unspecified: Secondary | ICD-10-CM | POA: Diagnosis not present

## 2021-01-15 DIAGNOSIS — U071 COVID-19: Secondary | ICD-10-CM | POA: Diagnosis not present

## 2021-01-15 LAB — COMPREHENSIVE METABOLIC PANEL
ALT: 24 U/L (ref 0–44)
AST: 22 U/L (ref 15–41)
Albumin: 3.6 g/dL (ref 3.5–5.0)
Alkaline Phosphatase: 79 U/L (ref 38–126)
Anion gap: 6 (ref 5–15)
BUN: 30 mg/dL — ABNORMAL HIGH (ref 8–23)
CO2: 23 mmol/L (ref 22–32)
Calcium: 8.5 mg/dL — ABNORMAL LOW (ref 8.9–10.3)
Chloride: 104 mmol/L (ref 98–111)
Creatinine, Ser: 1.72 mg/dL — ABNORMAL HIGH (ref 0.61–1.24)
GFR, Estimated: 42 mL/min — ABNORMAL LOW (ref >60)
Glucose, Bld: 238 mg/dL — ABNORMAL HIGH (ref 70–99)
Potassium: 4.9 mmol/L (ref 3.5–5.1)
Sodium: 133 mmol/L — ABNORMAL LOW (ref 135–145)
Total Bilirubin: 0.7 mg/dL (ref 0.3–1.2)
Total Protein: 6.6 g/dL (ref 6.5–8.1)

## 2021-01-15 LAB — CBC WITH DIFFERENTIAL/PLATELET
Abs Immature Granulocytes: 0.05 10*3/uL (ref 0.00–0.07)
Basophils Absolute: 0 10*3/uL (ref 0.0–0.1)
Basophils Relative: 0 %
Eosinophils Absolute: 0.1 10*3/uL (ref 0.0–0.5)
Eosinophils Relative: 1 %
HCT: 33.4 % — ABNORMAL LOW (ref 39.0–52.0)
Hemoglobin: 11 g/dL — ABNORMAL LOW (ref 13.0–17.0)
Immature Granulocytes: 1 %
Lymphocytes Relative: 9 %
Lymphs Abs: 0.8 10*3/uL (ref 0.7–4.0)
MCH: 30.1 pg (ref 26.0–34.0)
MCHC: 32.9 g/dL (ref 30.0–36.0)
MCV: 91.3 fL (ref 80.0–100.0)
Monocytes Absolute: 0.7 10*3/uL (ref 0.1–1.0)
Monocytes Relative: 8 %
Neutro Abs: 6.8 10*3/uL (ref 1.7–7.7)
Neutrophils Relative %: 81 %
Platelets: 250 10*3/uL (ref 150–400)
RBC: 3.66 MIL/uL — ABNORMAL LOW (ref 4.22–5.81)
RDW: 13.8 % (ref 11.5–15.5)
WBC: 8.4 10*3/uL (ref 4.0–10.5)
nRBC: 0 % (ref 0.0–0.2)

## 2021-01-15 LAB — URINALYSIS, ROUTINE W REFLEX MICROSCOPIC
Bilirubin Urine: NEGATIVE
Glucose, UA: 150 mg/dL — AB
Hgb urine dipstick: NEGATIVE
Ketones, ur: NEGATIVE mg/dL
Leukocytes,Ua: NEGATIVE
Nitrite: NEGATIVE
Protein, ur: NEGATIVE mg/dL
Specific Gravity, Urine: 1.015 (ref 1.005–1.030)
pH: 5 (ref 5.0–8.0)

## 2021-01-15 LAB — CBG MONITORING, ED: Glucose-Capillary: 184 mg/dL — ABNORMAL HIGH (ref 70–99)

## 2021-01-15 MED ORDER — SODIUM CHLORIDE 0.9 % IV BOLUS: 1000.0000 mL | Freq: Once | INTRAVENOUS | Status: DC

## 2021-01-15 MED ORDER — SODIUM CHLORIDE 0.9 % IV SOLN: INTRAVENOUS | Status: DC

## 2021-01-15 MED ORDER — SODIUM CHLORIDE 0.9 % IV BOLUS
1000.0000 mL | Freq: Once | INTRAVENOUS | Status: AC
  Administered 2021-01-15: 1000 mL via INTRAVENOUS

## 2021-01-15 NOTE — Discharge Instructions (Addendum)
Hold your blood pressure medicine (Norvasc and Benicar) for 2 days.  Check your blood pressure before resuming medicine.  If it is still low, do not take your blood pressure medicine.

## 2021-01-15 NOTE — ED Triage Notes (Addendum)
Rcems from home cc of hypotension and hypoglycemia. Pt was in line waiting to get something to eat when he felt light headed. Possible LOC.  Ems arrived and said his bp was in the 29H systolic and sugar was in the 70s.   Was covid + last Saturday  20g l ac -- 5101ml ns infused

## 2021-01-15 NOTE — ED Notes (Signed)
Meal given. Helped set pt up.

## 2021-01-15 NOTE — ED Provider Notes (Signed)
[facility] EMERGENCY DEPARTMENT Provider Note   CSN: [id number] Arrival date & time: 01/15/21  1126     History Chief Complaint  Patient presents with   Hypotension    Patrick Bautista is a 70 y.o. male.  Pt presents to the ED today with hypotension and hypoglycemia.  Pt was diagnosed with Covid on 9/17.  He was really sick and did not get out of bed much or eat/drink much for the first few days of his dx.  He passed out in the bathroom on Tuesday the 20th, but did not hurt himself.  He got something to drink and got back in bed.  He is finally starting to feel better and thought he'd go out to breakfast.  He was in line for the food and felt light headed.  He sat down and may have passed out.  His sbp was in the 70s by EMS.  Pt was given 500 cc NS en route and bp is up to 102.  Pt denies any pain.  His bs was in the 70s as well.  He was given some oj to drink.  Pt took all of his meds pta.      Past Medical History:  Diagnosis Date   Arthritis    Diabetes mellitus without complication ([place])    type 2   Diverticulitis 15 yrs ago   History of kidney stones    right ureteral stone and one in 2017   History of radiation therapy 03/25/2020-04/14/2020   scrotum/pelvis       Dr. 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   Kidney stones    Acute pyelonephritis 11/05/2015   Acute diverticulitis 11/05/2015   AKI (acute kidney injury) ([place]) 11/04/2015    Past Surgical History:  Procedure Laterality Date   colonscopy  15 yrs ago   CYSTOSCOPY/URETEROSCOPY/HOLMIUM LASER/STENT PLACEMENT Right 02/16/2017   Procedure:  CYSTOSCOPY/RETROGRADE/URETEROSCOPY/HOLMIUM LASER/STENT PLACEMENT;  Surgeon: Ardis Hughs, MD;  Location: [facility]S;  Service: Urology;  Laterality: Right;  NEEDS 60 MIN FOR PROCEDURE   IR IMAGING GUIDED PORT INSERTION  09/19/2019   IR REMOVAL TUN ACCESS W/ PORT W/O FL MOD SED  01/19/2020   MOUTH SURGERY  20 yrs ago   ORCHIECTOMY Left 09/03/2019   Procedure: LEFT ORCHIECTOMY;  Surgeon: Ardis Hughs, MD;  Location: [facility];  Service: Urology;  Laterality: Left;       History reviewed. No pertinent family history.  Social History   Tobacco Use   Smoking status: Never   Smokeless tobacco: Never  Vaping Use   Vaping Use: Never used  Substance Use Topics   Alcohol use: No   Drug use: No    Home Medications Prior to Admission medications   Medication Sig Start Date End Date Taking? Authorizing Provider  acetaminophen (TYLENOL) 500 MG tablet Take 500-1,000 mg by mouth every 6 (six) hours as needed for moderate pain.     [provider]  Acetaminophen-Codeine 300-30 MG tablet Take 1 tablet by mouth 4 (four) times daily as needed. 10/19/20  [provider]  amLODipine (NORVASC) 2.5 MG tablet Take 1 tablet (2.5 mg total) by mouth daily. 2.5 mg + 5 mg =7.5 mg total daily dose 01/22/20   Eugenie Filler, MD  amLODipine (NORVASC) 5 MG tablet Take 1 tablet (5 mg total) by mouth daily. 2.5 mg + 5 mg =7.5 mg total daily dose 01/22/20   Eugenie Filler, MD  B-D UF III MINI PEN NEEDLES 31G X 5 MM MISC Inject into the skin 4 (four) times daily. 11/04/19   [provider]  clindamycin (CLEOCIN) 150 MG capsule Take 150 mg by mouth every 6 (six) hours. 10/19/20   [provider]  clotrimazole-betamethasone (LOTRISONE) cream SMARTSIG:1 Sparingly Topical Twice Daily 08/23/20   [provider]  ergocalciferol (VITAMIN D2) 1.25 MG (50000 UT) capsule Take 1 capsule (50,000 Units total) by mouth 2 (two) times a week. Patient not taking: Reported on 09/16/2020  10/09/19   Brunetta Genera, MD  fluocinonide cream (LIDEX) 6.71 % Apply 1 application topically 2 (two) times daily as needed (to access port).  Patient not taking: No sig reported    [provider]  guaiFENesin-dextromethorphan (ROBITUSSIN DM) 100-10 MG/5ML syrup Take 5 mLs by mouth every 4 (four) hours as needed for cough. Patient not taking: No sig reported 01/21/20   Eugenie Filler, MD  HUMALOG KWIKPEN 100 UNIT/ML KwikPen Inject 1-5 Units into the skin See admin instructions. Sliding scale : up to 4 times a day. 09/23/19   [provider]  lidocaine-prilocaine (EMLA) cream Apply to affected area once Patient not taking: No sig reported 10/06/19   Brunetta Genera, MD  loratadine (CLARITIN) 10 MG tablet Take 1 tablet (10 mg total) by mouth See admin instructions. Patient states he takes for 7 days: from 3rd day post chemo until 10th day post chemo Patient not taking: Reported on 09/16/2020 01/05/20   Brunetta Genera, MD  LORazepam (ATIVAN) 0.5 MG tablet TAKE 1 TABLET BY MOUTH EVERY 6 HOURS AS NEEDED (NAUSEA OR VOMITING). Patient not taking: Reported on 09/16/2020 01/09/20   Brunetta Genera, MD  olmesartan-hydrochlorothiazide (BENICAR HCT) 40-25 MG tablet Take 1 tablet by mouth daily. 01/28/20   Eugenie Filler, MD  [facility] VERIO test strip 2 (two) times daily. 10/26/19   [provider]  pantoprazole (PROTONIX) 40 MG tablet TAKE 1 TABLET BY MOUTH EVERY DAY BEFORE BREAKFAST 08/04/20   Volanda Napoleon, MD  prochlorperazine (COMPAZINE) 10 MG tablet Take 1 tablet (10 mg total) by mouth every 6 (six) hours as needed (Nausea or vomiting). Patient not taking: No sig reported 10/06/19   Brunetta Genera, MD  senna-docusate (SENNA S) 8.6-50 MG tablet Take 2 tablets by mouth at bedtime as needed for mild constipation or moderate constipation. Patient not taking: No sig reported 10/22/19   Brunetta Genera, MD  traMADol (ULTRAM) 50 MG tablet Take 50 mg by  mouth every 6 (six) hours as needed. 10/26/20   [provider]  TRESIBA FLEXTOUCH 100 UNIT/ML FlexTouch Pen Inject 40 Units into the skin daily.  09/23/19   [provider]    Allergies    Hydrocodone-acetaminophen, Oxycodone hcl, Penicillin g benzathine, Codeine, Hydrocodone, Penicillins, Percocet [oxycodone-acetaminophen], and Tape  Review of Systems   Review of Systems  Neurological:  Positive for weakness.  All other systems reviewed and are negative.  Physical Exam Updated Vital Signs BP 105/67   Pulse 81   Temp 98 F (36.7 C)   Resp 19  Ht 5\' 10"  (1.778 m)   Wt 98.9 kg   SpO2 98%   BMI 31.28 kg/m   Physical Exam Vitals and nursing note reviewed.  Constitutional:      Appearance: Normal appearance.  HENT:     Head: Normocephalic.     Comments: Bruising to right forehead (yellow-green bruise) from fall on Tuesday.    Right Ear: External ear normal.     Left Ear: External ear normal.     Nose: Nose normal.     Mouth/Throat:     Mouth: Mucous membranes are dry.  Eyes:     Extraocular Movements: Extraocular movements intact.     Conjunctiva/sclera: Conjunctivae normal.     Pupils: Pupils are equal, round, and reactive to light.  Cardiovascular:     Rate and Rhythm: Normal rate and regular rhythm.     Pulses: Normal pulses.     Heart sounds: Normal heart sounds.  Pulmonary:     Effort: Pulmonary effort is normal.     Breath sounds: Normal breath sounds.  Abdominal:     General: Abdomen is flat. Bowel sounds are normal.     Palpations: Abdomen is soft.  Musculoskeletal:        General: Normal range of motion.     Cervical back: Normal range of motion and neck supple.  Skin:    General: Skin is warm.  Neurological:     General: No focal deficit present.     Mental Status: He is alert and oriented to person, place, and time.  Psychiatric:        Mood and Affect: Mood normal.        Behavior: Behavior normal.        Thought Content: Thought  content normal.        Judgment: Judgment normal.    ED Results / Procedures / Treatments   Labs (all labs ordered are listed, but only abnormal results are displayed) Labs Reviewed  CBC WITH DIFFERENTIAL/PLATELET - Abnormal; Notable for the following components:      Result Value   RBC 3.66 (*)    Hemoglobin 11.0 (*)    HCT 33.4 (*)    All other components within normal limits  COMPREHENSIVE METABOLIC PANEL - Abnormal; Notable for the following components:   Sodium 133 (*)    Glucose, Bld 238 (*)    BUN 30 (*)    Creatinine, Ser 1.72 (*)    Calcium 8.5 (*)    GFR, Estimated 42 (*)    All other components within normal limits  URINALYSIS, ROUTINE W REFLEX MICROSCOPIC - Abnormal; Notable for the following components:   Glucose, UA 150 (*)    All other components within normal limits  CBG MONITORING, ED - Abnormal; Notable for the following components:   Glucose-Capillary 184 (*)    All other components within normal limits    EKG EKG Interpretation  Date/Time:  Saturday January 15 2021 11:41:49 EDT Ventricular Rate:  77 PR Interval:  232 QRS Duration: 99 QT Interval:  388 QTC Calculation: 440 R Axis:   20 Text Interpretation: Sinus rhythm Prolonged PR interval Low voltage, precordial leads Borderline T abnormalities, anterior leads No significant change since last tracing Confirmed by Isla Pence [phone]) on 01/15/2021 11:44:58 AM  Radiology DG Chest Port 1 View  Result Date: 01/15/2021 CLINICAL DATA:  70 year old male with syncope. Hypotension and hypoglycemia. Recently diagnosed with COVID-19. EXAM: PORTABLE CHEST 1 VIEW COMPARISON:  Portable chest 01/17/2020 and earlier. FINDINGS: Portable  AP upright view at 1144 hours. Right chest Port-A-Cath has been removed since last year. Stable somewhat low lung volumes. Normal cardiac size and mediastinal contours. Visualized tracheal air column is within normal limits. Allowing for portable technique the lungs are clear. No  pneumothorax or pleural effusion. No acute osseous abnormality identified. IMPRESSION: No acute cardiopulmonary abnormality. Electronically Signed   By: Genevie Ann M.D.   On: 01/15/2021 11:50    Procedures Procedures   Medications Ordered in ED Medications  sodium chloride 0.9 % bolus 1,000 mL (0 mLs Intravenous Stopped 01/15/21 1236)    And  0.9 %  sodium chloride infusion ( Intravenous Infusion Verify 01/15/21 1459)  sodium chloride 0.9 % bolus 1,000 mL (1,000 mLs Intravenous Not Given 01/15/21 1502)    ED Course  I have reviewed the triage vital signs and the nursing notes.  Pertinent labs & imaging results that were available during my care of the patient were reviewed by me and considered in my medical decision making (see chart for details).    MDM Rules/Calculators/A&P                           Pt is feeling much better.  He ate and had a liter of fluids.  He is able to ambulate without feeling dizzy.  He is likely hypotensive due to lack of fluid intake and dehydration after recovering from covid.  He is told to hold his bp meds for 2 days.  He is to recheck his blood pressure prior to starting the bp meds again.  He knows to return if worse.  F/u with pcp.  Final Clinical Impression(s) / ED Diagnoses Final diagnoses:  Dehydration  Hypotensive syncope    Rx / DC Orders ED Discharge Orders     None        Isla Pence, MD 01/15/21 1505

## 2021-02-01 DIAGNOSIS — I10 Essential (primary) hypertension: Secondary | ICD-10-CM | POA: Diagnosis not present

## 2021-02-01 DIAGNOSIS — C8590 Non-Hodgkin lymphoma, unspecified, unspecified site: Secondary | ICD-10-CM | POA: Diagnosis not present

## 2021-02-01 DIAGNOSIS — E1165 Type 2 diabetes mellitus with hyperglycemia: Secondary | ICD-10-CM | POA: Diagnosis not present

## 2021-02-01 DIAGNOSIS — E7800 Pure hypercholesterolemia, unspecified: Secondary | ICD-10-CM | POA: Diagnosis not present

## 2021-02-11 DIAGNOSIS — H2513 Age-related nuclear cataract, bilateral: Secondary | ICD-10-CM | POA: Diagnosis not present

## 2021-02-11 DIAGNOSIS — H52203 Unspecified astigmatism, bilateral: Secondary | ICD-10-CM | POA: Diagnosis not present

## 2021-02-11 DIAGNOSIS — E119 Type 2 diabetes mellitus without complications: Secondary | ICD-10-CM | POA: Diagnosis not present

## 2021-02-23 ENCOUNTER — Other Ambulatory Visit: Payer: Self-pay | Admitting: Hematology & Oncology

## 2021-02-23 DIAGNOSIS — C8339 Diffuse large B-cell lymphoma, extranodal and solid organ sites: Secondary | ICD-10-CM

## 2021-03-14 ENCOUNTER — Encounter: Payer: Self-pay | Admitting: Hematology & Oncology

## 2021-03-14 ENCOUNTER — Inpatient Hospital Stay: Payer: Medicare Other | Admitting: Hematology & Oncology

## 2021-03-14 ENCOUNTER — Other Ambulatory Visit: Payer: Self-pay

## 2021-03-14 ENCOUNTER — Inpatient Hospital Stay: Payer: Medicare Other | Attending: Hematology & Oncology

## 2021-03-14 VITALS — BP 119/66 | HR 90 | Temp 97.9°F | Resp 19 | Wt 221.0 lb

## 2021-03-14 DIAGNOSIS — Z888 Allergy status to other drugs, medicaments and biological substances status: Secondary | ICD-10-CM | POA: Insufficient documentation

## 2021-03-14 DIAGNOSIS — Z885 Allergy status to narcotic agent status: Secondary | ICD-10-CM | POA: Insufficient documentation

## 2021-03-14 DIAGNOSIS — C8335 Diffuse large B-cell lymphoma, lymph nodes of inguinal region and lower limb: Secondary | ICD-10-CM | POA: Diagnosis not present

## 2021-03-14 DIAGNOSIS — Z79899 Other long term (current) drug therapy: Secondary | ICD-10-CM | POA: Insufficient documentation

## 2021-03-14 DIAGNOSIS — E119 Type 2 diabetes mellitus without complications: Secondary | ICD-10-CM | POA: Insufficient documentation

## 2021-03-14 DIAGNOSIS — Z880 Allergy status to penicillin: Secondary | ICD-10-CM | POA: Diagnosis not present

## 2021-03-14 DIAGNOSIS — C8331 Diffuse large B-cell lymphoma, lymph nodes of head, face, and neck: Secondary | ICD-10-CM | POA: Diagnosis not present

## 2021-03-14 DIAGNOSIS — C8333 Diffuse large B-cell lymphoma, intra-abdominal lymph nodes: Secondary | ICD-10-CM

## 2021-03-14 LAB — CMP (CANCER CENTER ONLY)
ALT: 24 U/L (ref 0–44)
AST: 22 U/L (ref 15–41)
Albumin: 4.6 g/dL (ref 3.5–5.0)
Alkaline Phosphatase: 92 U/L (ref 38–126)
Anion gap: 6 (ref 5–15)
BUN: 25 mg/dL — ABNORMAL HIGH (ref 8–23)
CO2: 29 mmol/L (ref 22–32)
Calcium: 10.3 mg/dL (ref 8.9–10.3)
Chloride: 102 mmol/L (ref 98–111)
Creatinine: 1.42 mg/dL — ABNORMAL HIGH (ref 0.61–1.24)
GFR, Estimated: 53 mL/min — ABNORMAL LOW (ref >60)
Glucose, Bld: 166 mg/dL — ABNORMAL HIGH (ref 70–99)
Potassium: 4.7 mmol/L (ref 3.5–5.1)
Sodium: 137 mmol/L (ref 135–145)
Total Bilirubin: 0.4 mg/dL (ref 0.3–1.2)
Total Protein: 7.6 g/dL (ref 6.5–8.1)

## 2021-03-14 LAB — CBC WITH DIFFERENTIAL (CANCER CENTER ONLY)
Abs Immature Granulocytes: 0.04 10*3/uL (ref 0.00–0.07)
Basophils Absolute: 0 10*3/uL (ref 0.0–0.1)
Basophils Relative: 0 %
Eosinophils Absolute: 0.2 10*3/uL (ref 0.0–0.5)
Eosinophils Relative: 2 %
HCT: 38.1 % — ABNORMAL LOW (ref 39.0–52.0)
Hemoglobin: 12.7 g/dL — ABNORMAL LOW (ref 13.0–17.0)
Immature Granulocytes: 1 %
Lymphocytes Relative: 32 %
Lymphs Abs: 2.4 10*3/uL (ref 0.7–4.0)
MCH: 29.3 pg (ref 26.0–34.0)
MCHC: 33.3 g/dL (ref 30.0–36.0)
MCV: 88 fL (ref 80.0–100.0)
Monocytes Absolute: 0.7 10*3/uL (ref 0.1–1.0)
Monocytes Relative: 9 %
Neutro Abs: 4.4 10*3/uL (ref 1.7–7.7)
Neutrophils Relative %: 56 %
Platelet Count: 303 10*3/uL (ref 150–400)
RBC: 4.33 MIL/uL (ref 4.22–5.81)
RDW: 13.6 % (ref 11.5–15.5)
WBC Count: 7.6 10*3/uL (ref 4.0–10.5)
nRBC: 0 % (ref 0.0–0.2)

## 2021-03-14 LAB — LACTATE DEHYDROGENASE: LDH: 150 U/L (ref 98–192)

## 2021-03-14 NOTE — Progress Notes (Signed)
Hematology and Oncology Follow Up Visit  Patrick Bautista [id number] [birth year] 70 y.o. 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  We last saw him back in July.  Since then, he has had COVID.  He got it from his girlfriend.  Thankfully, he really was not all that sick.  He did take oral therapy for this.  He does have issues with diabetes.  He does see his endocrinologist who is trying to adjust his medications.  He has had no problems with nausea or vomiting.  He has had no change in bowel or bladder habits.  He does have some skin issues down in the groin area.  I took a look down there.  I do not see any type of Candida.  He does have a couple teeth that need to come out he says.  This seems to be in the maxillary area on the right side.  He has had no rashes.  He has had no headache.  He has had no mouth sores, outside of the teeth.  Overall, his performance status is ECOG 1.  Medications:  Current Outpatient Medications:    amLODipine (NORVASC) 2.5 MG tablet, Take 1 tablet by mouth daily., Disp: , Rfl:    acetaminophen (TYLENOL) 500 MG tablet, Take 500-1,000 mg by mouth every 6 (six) hours as needed for moderate pain. , Disp: , Rfl:    Acetaminophen-Codeine 300-30 MG tablet, Take 1 tablet by mouth 4 (four) times daily as needed., Disp: , Rfl:    amLODipine (NORVASC) 2.5 MG tablet, Take 1 tablet (2.5 mg total) by mouth daily. 2.5 mg + 5 mg =7.5 mg total daily dose, Disp: , Rfl:    amLODipine (NORVASC) 5 MG tablet, Take 1 tablet (5 mg total) by mouth daily. 2.5 mg + 5 mg =7.5 mg total daily dose, Disp: , Rfl: 11   B-D UF III MINI PEN NEEDLES 31G X 5 MM MISC, Inject into the skin 4 (four) times daily., Disp: , Rfl:     clindamycin (CLEOCIN) 150 MG capsule, Take 150 mg by mouth every 6 (six) hours., Disp: , Rfl:    clotrimazole-betamethasone (LOTRISONE) cream, SMARTSIG:1 Sparingly Topical Twice Daily, Disp: , Rfl:    ergocalciferol (VITAMIN D2) 1.25 MG (50000 UT) capsule, Take 1 capsule (50,000 Units total) by mouth 2 (two) times a week. (Patient not taking: Reported on 09/16/2020), Disp: 24 capsule, Rfl: 2   fluocinonide cream (LIDEX) 2.72 %, Apply 1 application topically 2 (two) times daily as needed (to access port).  (Patient not taking: No sig reported), Disp: , Rfl:    guaiFENesin-dextromethorphan (ROBITUSSIN DM) 100-10 MG/5ML syrup, Take 5 mLs by mouth every 4 (four) hours as needed for cough. (Patient not taking: No sig reported), Disp: 118 mL, Rfl: 0   HUMALOG KWIKPEN 100 UNIT/ML KwikPen, Inject 1-5 Units into the skin See admin instructions. Sliding scale : up to 4 times a day., Disp: , Rfl:    lidocaine-prilocaine (EMLA) cream, Apply to affected area once (Patient not taking: No sig reported), Disp: 30 g, Rfl: 3   loratadine (CLARITIN) 10 MG tablet, Take 1 tablet (10 mg total) by mouth See admin instructions. Patient states he takes  for 7 days: from 3rd day post chemo until 10th day post chemo (Patient not taking: Reported on 09/16/2020), Disp: 28 tablet, Rfl: 0   LORazepam (ATIVAN) 0.5 MG tablet, TAKE 1 TABLET BY MOUTH EVERY 6 HOURS AS NEEDED (NAUSEA OR VOMITING). (Patient not taking: Reported on 09/16/2020), Disp: 30 tablet, Rfl: 0   olmesartan-hydrochlorothiazide (BENICAR HCT) 40-25 MG tablet, Take 1 tablet by mouth daily., Disp: , Rfl:    ONETOUCH VERIO test strip, 2 (two) times daily., Disp: , Rfl:    pantoprazole (PROTONIX) 40 MG tablet, TAKE 1 TABLET BY MOUTH EVERY DAY BEFORE BREAKFAST, Disp: 90 tablet, Rfl: 1   prochlorperazine (COMPAZINE) 10 MG tablet, Take 1 tablet (10 mg total) by mouth every 6 (six) hours as needed (Nausea or vomiting). (Patient not taking: No sig reported), Disp: 30 tablet, Rfl: 6    senna-docusate (SENNA S) 8.6-50 MG tablet, Take 2 tablets by mouth at bedtime as needed for mild constipation or moderate constipation. (Patient not taking: No sig reported), Disp: 60 tablet, Rfl: 1   traMADol (ULTRAM) 50 MG tablet, Take 50 mg by mouth every 6 (six) hours as needed., Disp: , Rfl:    TRESIBA FLEXTOUCH 100 UNIT/ML FlexTouch Pen, Inject 40 Units into the skin daily. , Disp: , Rfl:   Allergies:  Allergies  Allergen Reactions   Hydrocodone-Acetaminophen Other (See Comments)   Oxycodone Hcl Other (See Comments)   Penicillin G Benzathine Other (See Comments)   Codeine Itching   Hydrocodone Nausea And Vomiting   Penicillins Itching    Has patient had a PCN reaction causing immediate rash, facial/tongue/throat swelling, SOB or lightheadedness with hypotension: No Has patient had a PCN reaction causing severe rash involving mucus membranes or skin necrosis: No Has patient had a PCN reaction that required hospitalization No Has patient had a PCN reaction occurring within the last 10 years: No If all of the above answers are "NO", then may proceed with Cephalosporin use.    Percocet [Oxycodone-Acetaminophen] Nausea And Vomiting   Tape Itching and Rash     Paper Tape causes blisters; please use Coban wrap if possible    Past Medical History, Surgical history, Social history, and Family History were reviewed and updated.  Review of Systems: Review of Systems  Constitutional: Negative.   HENT:  Negative.    Eyes: Negative.   Respiratory: Negative.    Cardiovascular: Negative.   Gastrointestinal: Negative.   Endocrine: Negative.   Genitourinary: Negative.    Musculoskeletal: Negative.   Skin: Negative.   Neurological: Negative.   Hematological: Negative.   Psychiatric/Behavioral: Negative.     Physical Exam:  weight is 221 lb (100.2 kg). His oral temperature is 97.9 F (36.6 C). His blood pressure is 119/66 and his pulse is 90. His respiration is 19 and oxygen saturation  is 99%.   Wt Readings from Last 3 Encounters:  03/14/21 221 lb (100.2 kg)  01/15/21 218 lb 0.6 oz (98.9 kg)  11/10/20 218 lb (98.9 kg)    Physical Exam Vitals reviewed.  HENT:     Head: Normocephalic and atraumatic.  Eyes:     Pupils: Pupils are equal, round, and reactive to light.  Cardiovascular:     Rate and Rhythm: Normal rate and regular rhythm.     Heart sounds: Normal heart sounds.  Pulmonary:     Effort: Pulmonary effort is normal.     Breath sounds: Normal breath sounds.  Abdominal:     General: Bowel sounds are normal.  Palpations: Abdomen is soft.  Musculoskeletal:        General: No tenderness or deformity. Normal range of motion.     Cervical back: Normal range of motion.  Lymphadenopathy:     Cervical: No cervical adenopathy.  Skin:    General: Skin is warm and dry.     Findings: No erythema or rash.  Neurological:     Mental Status: He is alert and oriented to person, place, and time.  Psychiatric:        Behavior: Behavior normal.        Thought Content: Thought content normal.        Judgment: Judgment normal.     Lab Results  Component Value Date   WBC 7.6 03/14/2021   HGB 12.7 (L) 03/14/2021   HCT 38.1 (L) 03/14/2021   MCV 88.0 03/14/2021   PLT 303 03/14/2021     Chemistry      Component Value Date/Time   NA 137 03/14/2021 0936   K 4.7 03/14/2021 0936   CL 102 03/14/2021 0936   CO2 29 03/14/2021 0936   BUN 25 (H) 03/14/2021 0936   CREATININE 1.42 (H) 03/14/2021 0936      Component Value Date/Time   CALCIUM 10.3 03/14/2021 0936   ALKPHOS 92 03/14/2021 0936   AST 22 03/14/2021 0936   ALT 24 03/14/2021 0936   BILITOT 0.4 03/14/2021 0936      Impression and Plan: Mr. Blancett is a very nice 70 year old white male.  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  He had this removed in May.  He then underwent systemic chemotherapy along with intrathecal methotrexate.  He then underwent inguinal radiation therapy.  So far,  everything looks fantastic.  I do not see any evidence of recurrent disease.  I do not see that we have to do a PET scan on him.  If he needs to have his teeth taken out, I do not see a problem with this.  We will now plan to get him back in 4 months.  We will get him back right before the holidays.   Volanda Napoleon, MD 11/21/202210:24 AM

## 2021-03-15 DIAGNOSIS — E7800 Pure hypercholesterolemia, unspecified: Secondary | ICD-10-CM | POA: Diagnosis not present

## 2021-03-15 DIAGNOSIS — C8590 Non-Hodgkin lymphoma, unspecified, unspecified site: Secondary | ICD-10-CM | POA: Diagnosis not present

## 2021-03-15 DIAGNOSIS — I10 Essential (primary) hypertension: Secondary | ICD-10-CM | POA: Diagnosis not present

## 2021-03-15 DIAGNOSIS — E1165 Type 2 diabetes mellitus with hyperglycemia: Secondary | ICD-10-CM | POA: Diagnosis not present

## 2021-03-23 DIAGNOSIS — Z85828 Personal history of other malignant neoplasm of skin: Secondary | ICD-10-CM | POA: Diagnosis not present

## 2021-03-23 DIAGNOSIS — L570 Actinic keratosis: Secondary | ICD-10-CM | POA: Diagnosis not present

## 2021-03-23 DIAGNOSIS — E1165 Type 2 diabetes mellitus with hyperglycemia: Secondary | ICD-10-CM | POA: Diagnosis not present

## 2021-03-23 DIAGNOSIS — Z794 Long term (current) use of insulin: Secondary | ICD-10-CM | POA: Diagnosis not present

## 2021-05-05 IMAGING — PT NM PET TUM IMG RESTAG (PS) SKULL BASE T - THIGH
7 series · 25 of 25 positions shown · non-contrast
Comparison: 03/09/2020 PET-CT.

CLINICAL DATA: Subsequent treatment strategy for large B-cell
lymphoma status post left orchiectomy August 2019.

EXAM:
NUCLEAR MEDICINE PET SKULL BASE TO THIGH
TECHNIQUE: 11.2 mCi F-18 FDG was injected intravenously. Full-ring PET imaging
was performed from the skull base to thigh after the radiotracer. CT
data was obtained and used for attenuation correction and anatomic
localization.
Fasting blood glucose: 162 mg/dl

[Series 3: pet sk_thigh ac · axial · 5.0mm · 4.07mm/px · z∈[-966,+2]mm · 6 of 243 slices shown]
[im 1/243]
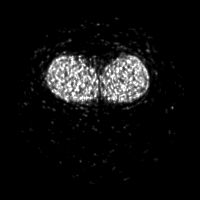
[im 49/243]
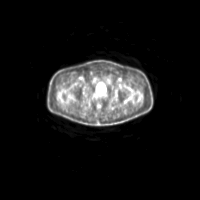
[im 97/243]
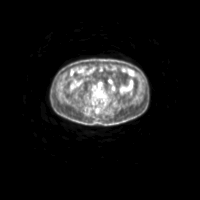
[im 146/243]
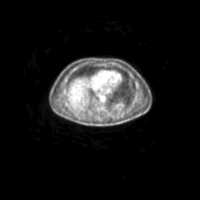
[im 194/243]
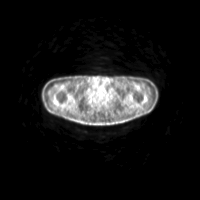
[im 243/243]
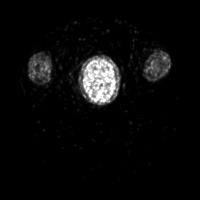

[Series 4: ct sk_thigh 5.0 bf37 · axial · 5.0mm · 0.98mm/px · z∈[-966,+2]mm · 6 of 243 slices shown]
[im 1/243]
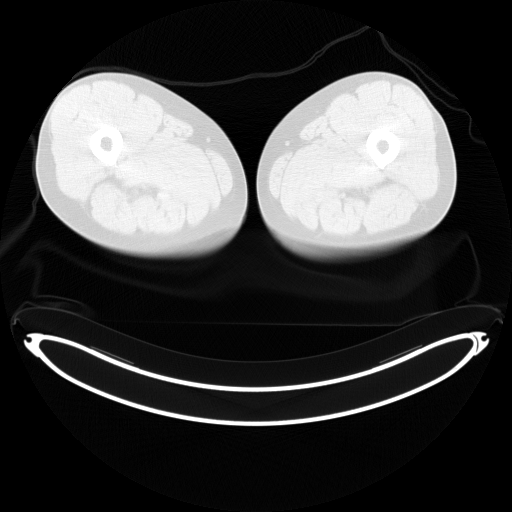
[im 49/243]
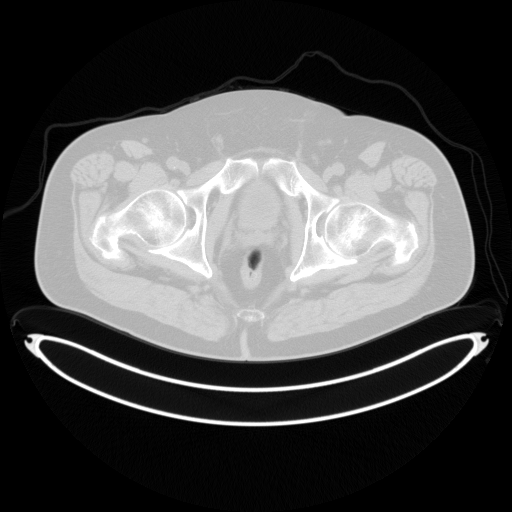
[im 97/243]
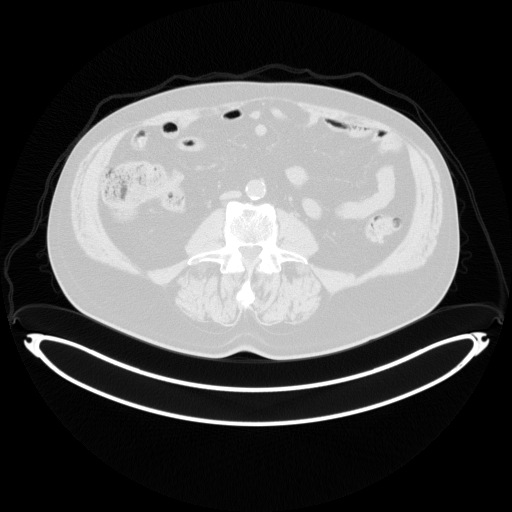
[im 146/243]
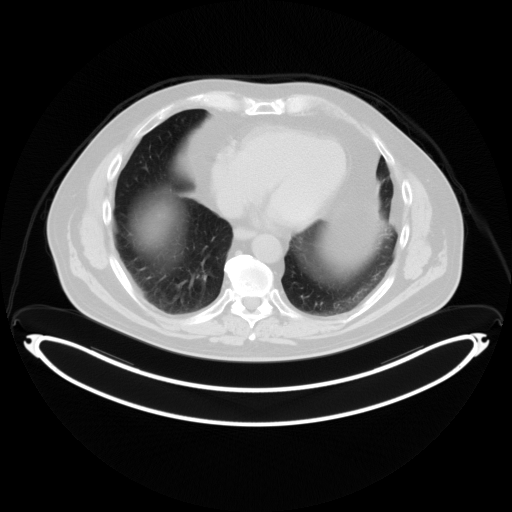
[im 194/243]
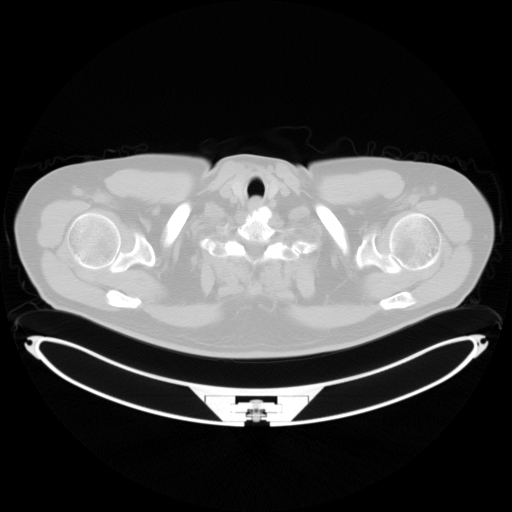
[im 243/243  brain]
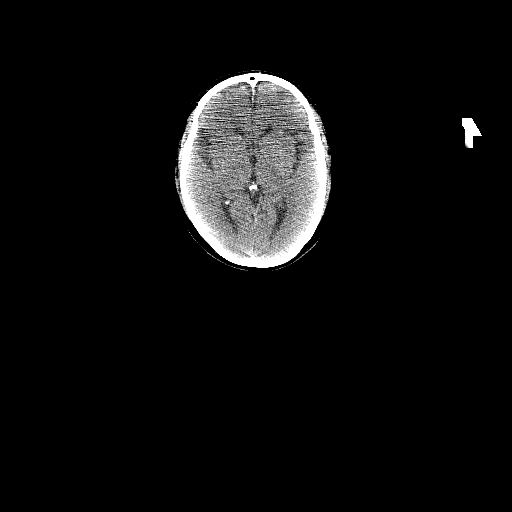

[Series 5: pet sk_thigh nac · axial · 5.0mm · 4.07mm/px · z∈[-966,+2]mm · 5 of 243 slices shown]
[im 1/243]
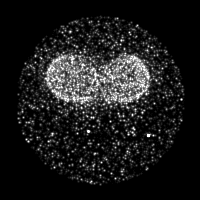
[im 61/243]
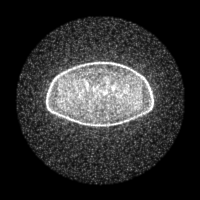
[im 122/243]
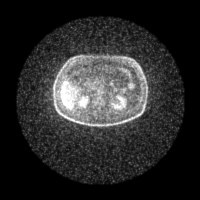
[im 182/243]
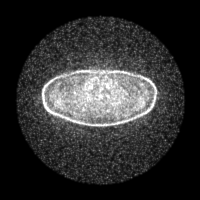
[im 243/243]
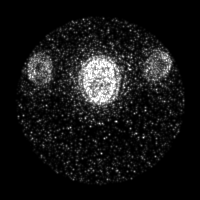

[Series 8: ct sk_thigh 5.0 br59 (id)_bone · axial · 5.0mm · 0.61mm/px · 1 of 66 slices shown]
[im 1/66]
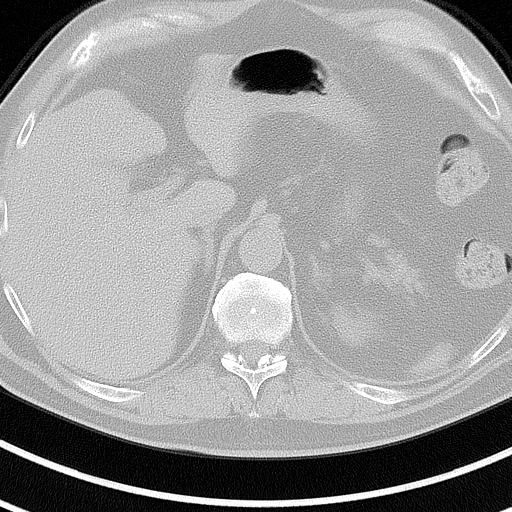

[Series 603: fused cor · 1 of 59 slices shown]
[im 1/59]
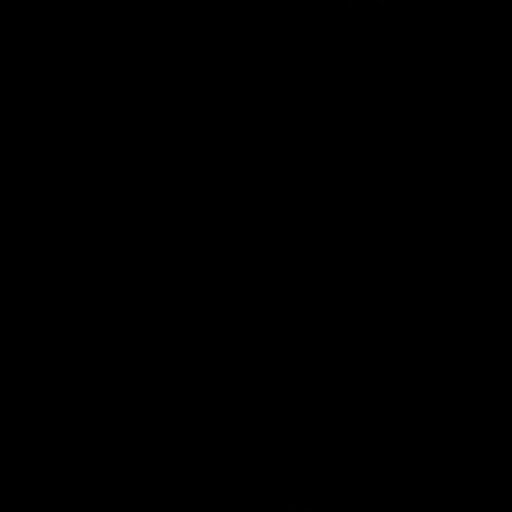

[Series 604: <mip collection> · coronal · 2.01mm/px · 1 of 32 slices shown]
[im 1/32]
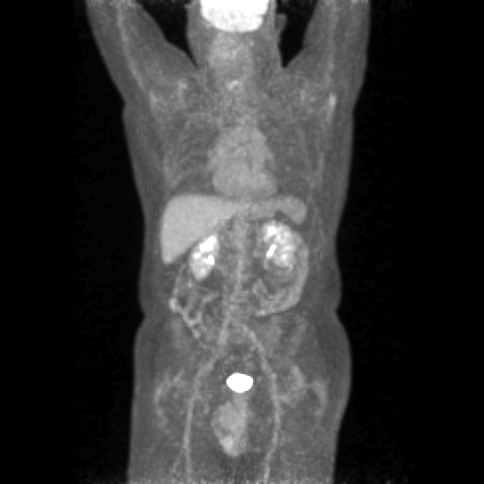

[Series 605: range-ct sk_thigh 5.0 bf37-tra-<alpha range> · 5 of 234 slices shown]
[im 1/234]
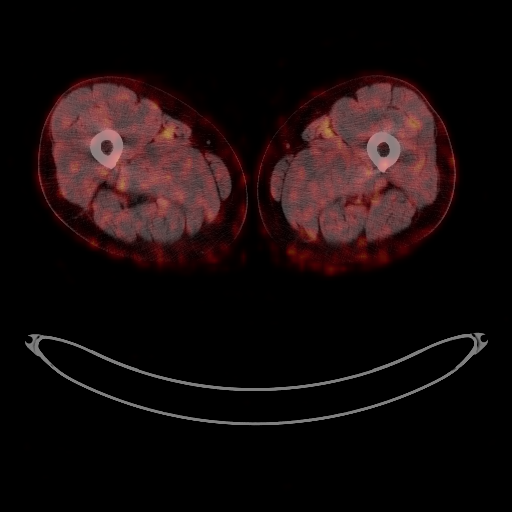
[im 59/234]
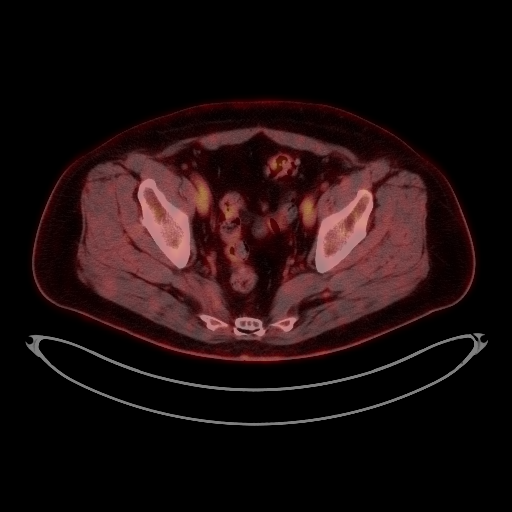
[im 117/234]
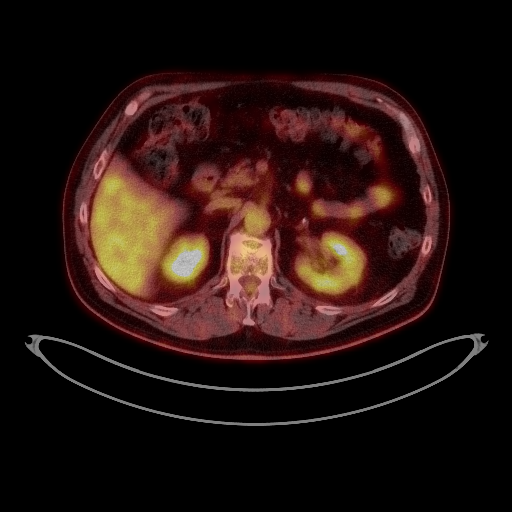
[im 175/234]
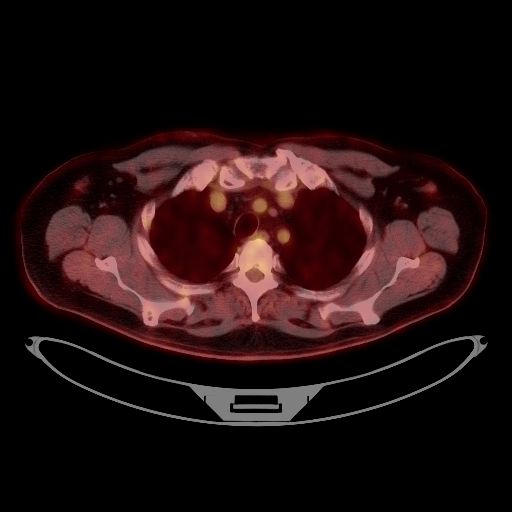
[im 234/234]
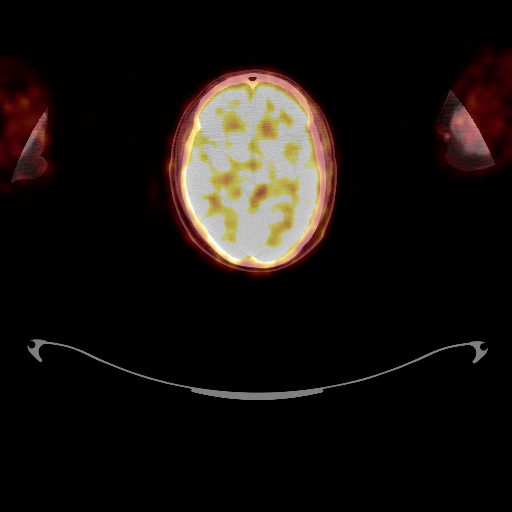

[25 of 25 positions shown; findings below may reference images not displayed]

FINDINGS: Mediastinal blood pool activity: SUV max

Liver activity: SUV max

NECK: No hypermetabolic lymph nodes in the neck.

Incidental CT findings: none

CHEST: No enlarged or hypermetabolic axillary, mediastinal or hilar
lymph nodes. Right axillary 0.5 cm lymph node with max SUV
(series 4/image 63), previously 0.5 cm with max SUV 1.0, not
appreciably changed in size or metabolism. Left axillary 0.9 cm
lymph node with max SUV 1.6 (series 4/image 63), previously 0.9 cm
with max SUV 1.4, not appreciably changed in size or metabolism.
These nodes demonstrate [HOSPITAL] category 2 activity.

No hypermetabolic pulmonary findings. Right lower lobe 5 mm
pulmonary nodule (series 8/image 45), slightly increased from 4 mm,
below PET resolution.

Incidental CT findings: Coronary atherosclerosis. Atherosclerotic
nonaneurysmal thoracic aorta.

ABDOMEN/PELVIS: No abnormal hypermetabolic activity within the
liver, pancreas, adrenal glands, or spleen. No hypermetabolic lymph
nodes in the abdomen or pelvis.

Incidental CT findings: Left orchiectomy. Atherosclerotic
nonaneurysmal abdominal aorta. Moderate left colonic diverticulosis.

SKELETON: No focal hypermetabolic activity to suggest skeletal
metastasis.

Incidental CT findings: none
IMPRESSION: 1. No evidence of hypermetabolic lymphoma. No enlarged or
hypermetabolic lymph nodes. No recurrent hypermetabolic adrenal
lesions.
2. Right lower lobe 5 mm pulmonary nodule, below PET resolution,
slightly increased from 4 mm. Suggest attention on follow-up
noncontrast chest CT in 3-6 months.
3. Chronic findings include: Aortic Atherosclerosis ([id number]).
Coronary atherosclerosis. Moderate left colonic diverticulosis.

## 2021-06-11 DIAGNOSIS — E1165 Type 2 diabetes mellitus with hyperglycemia: Secondary | ICD-10-CM | POA: Diagnosis not present

## 2021-06-11 DIAGNOSIS — Z794 Long term (current) use of insulin: Secondary | ICD-10-CM | POA: Diagnosis not present

## 2021-06-15 DIAGNOSIS — E7800 Pure hypercholesterolemia, unspecified: Secondary | ICD-10-CM | POA: Diagnosis not present

## 2021-06-15 DIAGNOSIS — E1165 Type 2 diabetes mellitus with hyperglycemia: Secondary | ICD-10-CM | POA: Diagnosis not present

## 2021-06-22 DIAGNOSIS — I10 Essential (primary) hypertension: Secondary | ICD-10-CM | POA: Diagnosis not present

## 2021-06-22 DIAGNOSIS — E7800 Pure hypercholesterolemia, unspecified: Secondary | ICD-10-CM | POA: Diagnosis not present

## 2021-06-22 DIAGNOSIS — C8590 Non-Hodgkin lymphoma, unspecified, unspecified site: Secondary | ICD-10-CM | POA: Diagnosis not present

## 2021-06-22 DIAGNOSIS — E1165 Type 2 diabetes mellitus with hyperglycemia: Secondary | ICD-10-CM | POA: Diagnosis not present

## 2021-06-24 ENCOUNTER — Telehealth: Payer: Self-pay | Admitting: *Deleted

## 2021-06-24 NOTE — Telephone Encounter (Signed)
Call received from patient concerned that he is having issues with his penis and foreskin after receiving XRT last year.  Patient informed to contact his urologist per order of Dr. Marin Olp.  Pt upset with that order and states that he will not contact his urologist and will wait to see Dr. Marin Olp at his next scheduled appt at the end of the month.  Dr. Marin Olp notified.  Dr. Marin Olp spoke with Dr. Diona Fanti and Dr. Diona Fanti states that his office will contact pt for appt. ?

## 2021-06-30 DIAGNOSIS — C8519 Unspecified B-cell lymphoma, extranodal and solid organ sites: Secondary | ICD-10-CM | POA: Diagnosis not present

## 2021-07-04 ENCOUNTER — Telehealth: Payer: Self-pay | Admitting: *Deleted
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
## 2021-07-12 DIAGNOSIS — Z794 Long term (current) use of insulin: Secondary | ICD-10-CM | POA: Diagnosis not present

## 2021-07-12 DIAGNOSIS — E1165 Type 2 diabetes mellitus with hyperglycemia: Secondary | ICD-10-CM | POA: Diagnosis not present

## 2021-07-13 ENCOUNTER — Other Ambulatory Visit: Payer: Self-pay | Admitting: Urology

## 2021-07-13 DIAGNOSIS — L919 Hypertrophic disorder of the skin, unspecified: Secondary | ICD-10-CM | POA: Diagnosis not present

## 2021-07-13 DIAGNOSIS — L988 Other specified disorders of the skin and subcutaneous tissue: Secondary | ICD-10-CM | POA: Diagnosis not present

## 2021-07-18 ENCOUNTER — Encounter: Payer: Self-pay | Admitting: Hematology & Oncology

## 2021-07-18 ENCOUNTER — Inpatient Hospital Stay: Payer: Medicare Other | Admitting: Hematology & Oncology

## 2021-07-18 ENCOUNTER — Inpatient Hospital Stay: Payer: Medicare Other | Attending: Hematology & Oncology

## 2021-07-18 ENCOUNTER — Other Ambulatory Visit: Payer: Self-pay

## 2021-07-18 VITALS — BP 110/64 | HR 95 | Temp 98.3°F | Resp 19 | Ht 70.08 in | Wt 227.8 lb

## 2021-07-18 DIAGNOSIS — Z880 Allergy status to penicillin: Secondary | ICD-10-CM | POA: Insufficient documentation

## 2021-07-18 DIAGNOSIS — T2106XA Burn of unspecified degree of male genital region, initial encounter: Secondary | ICD-10-CM | POA: Insufficient documentation

## 2021-07-18 DIAGNOSIS — Z885 Allergy status to narcotic agent status: Secondary | ICD-10-CM | POA: Insufficient documentation

## 2021-07-18 DIAGNOSIS — Z79899 Other long term (current) drug therapy: Secondary | ICD-10-CM | POA: Diagnosis not present

## 2021-07-18 DIAGNOSIS — C8333 Diffuse large B-cell lymphoma, intra-abdominal lymph nodes: Secondary | ICD-10-CM

## 2021-07-18 DIAGNOSIS — Z888 Allergy status to other drugs, medicaments and biological substances status: Secondary | ICD-10-CM | POA: Insufficient documentation

## 2021-07-18 DIAGNOSIS — C8331 Diffuse large B-cell lymphoma, lymph nodes of head, face, and neck: Secondary | ICD-10-CM

## 2021-07-18 DIAGNOSIS — C8339 Diffuse large B-cell lymphoma, extranodal and solid organ sites: Secondary | ICD-10-CM | POA: Insufficient documentation

## 2021-07-18 LAB — CBC WITH DIFFERENTIAL (CANCER CENTER ONLY)
Abs Immature Granulocytes: 0.06 10*3/uL (ref 0.00–0.07)
Basophils Absolute: 0 10*3/uL (ref 0.0–0.1)
Basophils Relative: 0 %
Eosinophils Absolute: 0.4 10*3/uL (ref 0.0–0.5)
Eosinophils Relative: 3 %
HCT: 36.3 % — ABNORMAL LOW (ref 39.0–52.0)
Hemoglobin: 12.2 g/dL — ABNORMAL LOW (ref 13.0–17.0)
Immature Granulocytes: 1 %
Lymphocytes Relative: 29 %
Lymphs Abs: 3.1 10*3/uL (ref 0.7–4.0)
MCH: 29.7 pg (ref 26.0–34.0)
MCHC: 33.6 g/dL (ref 30.0–36.0)
MCV: 88.3 fL (ref 80.0–100.0)
Monocytes Absolute: 1.1 10*3/uL — ABNORMAL HIGH (ref 0.1–1.0)
Monocytes Relative: 11 %
Neutro Abs: 5.8 10*3/uL (ref 1.7–7.7)
Neutrophils Relative %: 56 %
Platelet Count: 261 10*3/uL (ref 150–400)
RBC: 4.11 MIL/uL — ABNORMAL LOW (ref 4.22–5.81)
RDW: 13.6 % (ref 11.5–15.5)
WBC Count: 10.5 10*3/uL (ref 4.0–10.5)
nRBC: 0 % (ref 0.0–0.2)

## 2021-07-18 LAB — CMP (CANCER CENTER ONLY)
ALT: 22 U/L (ref 0–44)
AST: 18 U/L (ref 15–41)
Albumin: 4.5 g/dL (ref 3.5–5.0)
Alkaline Phosphatase: 88 U/L (ref 38–126)
Anion gap: 5 (ref 5–15)
BUN: 39 mg/dL — ABNORMAL HIGH (ref 8–23)
CO2: 27 mmol/L (ref 22–32)
Calcium: 9.9 mg/dL (ref 8.9–10.3)
Chloride: 103 mmol/L (ref 98–111)
Creatinine: 1.86 mg/dL — ABNORMAL HIGH (ref 0.61–1.24)
GFR, Estimated: 38 mL/min — ABNORMAL LOW (ref >60)
Glucose, Bld: 121 mg/dL — ABNORMAL HIGH (ref 70–99)
Potassium: 4.5 mmol/L (ref 3.5–5.1)
Sodium: 135 mmol/L (ref 135–145)
Total Bilirubin: 0.5 mg/dL (ref 0.3–1.2)
Total Protein: 7.2 g/dL (ref 6.5–8.1)

## 2021-07-18 LAB — LACTATE DEHYDROGENASE: LDH: 141 U/L (ref 98–192)

## 2021-07-18 MED ORDER — TRAMADOL HCL 50 MG PO TABS: 50.0000 mg | ORAL_TABLET | Freq: Four times a day (QID) | ORAL | 0 refills | Status: DC | PRN

## 2021-07-18 NOTE — Progress Notes (Signed)
?Hematology and Oncology Follow Up Visit ? ?Michio Thier ?[id number] ?1951-02-12 71 y.o. ?07/18/2021 ? ? 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 ?Current Therapy:   ?S/p R-CHOP x 5 cycles - completed in 12/2019 ?S/p IT MTX x 2 --- last dose on 12/26/2019 ?S/P XRT -- completed 3000 rad on 04/14/2020 ?    ?Interim History:  Mr. Barradas is back for his follow-up.  We last saw him back in November.  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  He actually had surgery last week by urology to try to help with some of the adhesions.  It looks like 0 by would be more surgery in the future. ? ?Otherwise, he really has done nicely.  He had no problems over the holiday season.  Have his blood sugars are doing much better now.  He has a continuous glucose monitoring system. ? ?He also is on Crestor now.  I guess his lipids may have been on the high side. ? ?He has had no rashes.  He has had no leg swelling.  He has had no cough or shortness of breath. ? ?There is been no change in bowel or bladder habits. ? ?Overall, I would say his performance status is probably ECOG 1.   ? ?Medications:  ?Current Outpatient Medications:  ?  acetaminophen (TYLENOL) 500 MG tablet, Take 500-1,000 mg by mouth every 6 (six) hours as needed for moderate pain. , Disp: , Rfl:  ?  amLODipine (NORVASC) 2.5 MG tablet, Take 1 tablet (2.5 mg total) by mouth daily. 2.5 mg + 5 mg =7.5 mg total daily dose, Disp: , Rfl:  ?  amLODipine (NORVASC) 5 MG tablet, Take 1 tablet (5 mg total) by mouth daily. 2.5 mg + 5 mg =7.5 mg total daily dose, Disp: , Rfl: 11 ?  B-D UF III MINI PEN NEEDLES 31G X 5 MM MISC, Inject into the skin 4 (four) times daily., Disp: , Rfl:  ?  clotrimazole-betamethasone (LOTRISONE) cream, SMARTSIG:1 Sparingly Topical Twice Daily, Disp: , Rfl:  ?  HUMALOG KWIKPEN 100 UNIT/ML KwikPen, Inject 1-5 Units into the skin See admin instructions. Sliding scale : up to 4  times a day., Disp: , Rfl:  ?  olmesartan-hydrochlorothiazide (BENICAR HCT) 40-25 MG tablet, Take 1 tablet by mouth daily., Disp: , Rfl:  ?  pantoprazole (PROTONIX) 40 MG tablet, TAKE 1 TABLET BY MOUTH EVERY DAY BEFORE BREAKFAST, Disp: 90 tablet, Rfl: 1 ?  traMADol (ULTRAM) 50 MG tablet, Take 50 mg by mouth every 6 (six) hours as needed., Disp: , Rfl:  ?  TRESIBA FLEXTOUCH 100 UNIT/ML FlexTouch Pen, Inject 40 Units into the skin daily. , Disp: , Rfl:  ?  ergocalciferol (VITAMIN D2) 1.25 MG (50000 UT) capsule, Take 1 capsule (50,000 Units total) by mouth 2 (two) times a week. (Patient not taking: Reported on 09/16/2020), Disp: 24 capsule, Rfl: 2 ?  fluocinonide cream (LIDEX) 6.44 %, Apply 1 application topically 2 (two) times daily as needed (to access port).  (Patient not taking: Reported on 03/24/2020), Disp: , Rfl:  ?  guaiFENesin-dextromethorphan (ROBITUSSIN DM) 100-10 MG/5ML syrup, Take 5 mLs by mouth every 4 (four) hours as needed for cough. (Patient not taking: Reported on 03/24/2020), Disp: 118 mL, Rfl: 0 ?  lidocaine-prilocaine (EMLA) cream, Apply to affected area once (Patient not taking: Reported on 01/16/2020), Disp: 30 g, Rfl: 3 ?  loratadine (CLARITIN) 10 MG tablet, Take 1  tablet (10 mg total) by mouth See admin instructions. Patient states he takes for 7 days: from 3rd day post chemo until 10th day post chemo (Patient not taking: Reported on 09/16/2020), Disp: 28 tablet, Rfl: 0 ?  LORazepam (ATIVAN) 0.5 MG tablet, TAKE 1 TABLET BY MOUTH EVERY 6 HOURS AS NEEDED (NAUSEA OR VOMITING). (Patient not taking: Reported on 09/16/2020), Disp: 30 tablet, Rfl: 0 ?  prochlorperazine (COMPAZINE) 10 MG tablet, Take 1 tablet (10 mg total) by mouth every 6 (six) hours as needed (Nausea or vomiting). (Patient not taking: Reported on 01/16/2020), Disp: 30 tablet, Rfl: 6 ?  senna-docusate (SENNA S) 8.6-50 MG tablet, Take 2 tablets by mouth at bedtime as needed for mild constipation or moderate constipation. (Patient not taking:  Reported on 03/24/2020), Disp: 60 tablet, Rfl: 1 ? ?Allergies:  ?Allergies  ?Allergen Reactions  ? Penicillin G Benzathine Itching  ? Codeine Itching  ? Hydrocodone Nausea And Vomiting  ? Hydrocodone-Acetaminophen Nausea And Vomiting  ? Oxycodone Hcl Nausea And Vomiting  ? Penicillins Itching  ?  Has patient had a PCN reaction causing immediate rash, facial/tongue/throat swelling, SOB or lightheadedness with hypotension: No ?Has patient had a PCN reaction causing severe rash involving mucus membranes or skin necrosis: No ?Has patient had a PCN reaction that required hospitalization No ?Has patient had a PCN reaction occurring within the last 10 years: No ?If all of the above answers are "NO", then may proceed with Cephalosporin use. ?  ? Percocet [Oxycodone-Acetaminophen] Nausea And Vomiting  ? Tape Itching and Rash  ?   Paper Tape causes blisters; please use Coban wrap if possible  ? ? ?Past Medical History, Surgical history, Social history, and Family History were reviewed and updated. ? ?Review of Systems: ?Review of Systems  ?Constitutional: Negative.   ?HENT:  Negative.    ?Eyes: Negative.   ?Respiratory: Negative.    ?Cardiovascular: Negative.   ?Gastrointestinal: Negative.   ?Endocrine: Negative.   ?Genitourinary: Negative.    ?Musculoskeletal: Negative.   ?Skin: Negative.   ?Neurological: Negative.   ?Hematological: Negative.   ?Psychiatric/Behavioral: Negative.    ? ?Physical Exam: ? vitals were not taken for this visit.  ? ?Wt Readings from Last 3 Encounters:  ?03/14/21 221 lb (100.2 kg)  ?01/15/21 218 lb 0.6 oz (98.9 kg)  ?11/10/20 218 lb (98.9 kg)  ? ? ?Physical Exam ?Vitals reviewed.  ?HENT:  ?   Head: Normocephalic and atraumatic.  ?Eyes:  ?   Pupils: Pupils are equal, round, and reactive to light.  ?Cardiovascular:  ?   Rate and Rhythm: Normal rate and regular rhythm.  ?   Heart sounds: Normal heart sounds.  ?Pulmonary:  ?   Effort: Pulmonary effort is normal.  ?   Breath sounds: Normal breath sounds.   ?Abdominal:  ?   General: Bowel sounds are normal.  ?   Palpations: Abdomen is soft.  ?Musculoskeletal:     ?   General: No tenderness or deformity. Normal range of motion.  ?   Cervical back: Normal range of motion.  ?Lymphadenopathy:  ?   Cervical: No cervical adenopathy.  ?Skin: ?   General: Skin is warm and dry.  ?   Findings: No erythema or rash.  ?Neurological:  ?   Mental Status: He is alert and oriented to person, place, and time.  ?Psychiatric:     ?   Behavior: Behavior normal.     ?   Thought Content: Thought content normal.     ?  Judgment: Judgment normal.  ? ? ? ?Lab Results  ?Component Value Date  ? WBC 10.5 07/18/2021  ? HGB 12.2 (L) 07/18/2021  ? HCT 36.3 (L) 07/18/2021  ? MCV 88.3 07/18/2021  ? PLT 261 07/18/2021  ? ?  Chemistry   ?   ?Component Value Date/Time  ? NA 135 07/18/2021 0900  ? K 4.5 07/18/2021 0900  ? CL 103 07/18/2021 0900  ? CO2 27 07/18/2021 0900  ? BUN 39 (H) 07/18/2021 0900  ? CREATININE 1.86 (H) 07/18/2021 0900  ?    ?Component Value Date/Time  ? CALCIUM 9.9 07/18/2021 0900  ? ALKPHOS 88 07/18/2021 0900  ? AST 18 07/18/2021 0900  ? ALT 22 07/18/2021 0900  ? BILITOT 0.5 07/18/2021 0900  ?  ? ? ?Impression and Plan: ?Mr. Linford is a very nice 71 year old white male.  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  He had this removed in May.  He then underwent systemic chemotherapy along with intrathecal methotrexate.  He then underwent inguinal radiation therapy. ? ?I just feel bad about the genital area.  He has a surgical scars.  He still has a lot of irritation.  It is still quite painful. ? ?I told him that I would like to believe that this will eventually get better.  Hopefully, he will be able to have "function" of the male genital.  I know this is a big part of his quality of life. ? ?I will still plan to get him back to see me in another 4 months. ? ? ?Volanda Napoleon, MD ?3/27/20239:34 AM ?

## 2021-07-21 ENCOUNTER — Telehealth: Payer: Self-pay | Admitting: Radiation Oncology

## 2021-07-21 NOTE — Telephone Encounter (Signed)
Follow-Up Visit Note-phone contact ?  ?CC: [name], L.[name], MD  [name], L.[name], MD ?  ?  ?Diagnosis: Primary large B-cell diffuse lymphoma of the left testicle ?  ?Interval Since Last Radiation: 1 year and 3 months ?  ?  ?Radiation Treatment Dates: 03/25/2020 through 04/14/2020 ?Site Technique Total Dose (Gy) Dose per Fx (Gy) Completed Fx Beam Energies  ?Scrotum: Pelvis 3D 30/30 2 15/15 6X, 15X  ?  ?  ?Narrative: Yesterday I spoke with Dr. Marin Olp.  He had recently seen [name] for follow-up.  Recent PET scan fortunately showed no evidence of recurrence of his lymphoma.  Dr. Marin Olp reported patient has been having some problems with adhesions along the foreskin region of his penis.  The patient recently had outpatient surgery for this issue and is recovering.  Details of his surgery and findings are not available in his medical record.  The patient's path report however was available for review surgery,  dated March 22.  This fortunately showed no dysplasia or malignancy.  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  I discussed these findings with the patient and recommended he discuss management with his urology team which he will see next week for postop visit. ? ?I offered to help in  any way with his follow-up and offered that he come in for review of his radiation treatment and additional management.  He will think about this follow-up and will continue close follow-up with Dr. Marin Olp and Dr. Louis Meckel in the meantime. ? ?[name], MD ? ?

## 2021-08-12 DIAGNOSIS — Z794 Long term (current) use of insulin: Secondary | ICD-10-CM | POA: Diagnosis not present

## 2021-08-12 DIAGNOSIS — E1165 Type 2 diabetes mellitus with hyperglycemia: Secondary | ICD-10-CM | POA: Diagnosis not present

## 2021-08-12 DIAGNOSIS — L239 Allergic contact dermatitis, unspecified cause: Secondary | ICD-10-CM | POA: Diagnosis not present

## 2021-08-19 ENCOUNTER — Other Ambulatory Visit: Payer: Self-pay | Admitting: Hematology & Oncology

## 2021-08-19 DIAGNOSIS — C8339 Diffuse large B-cell lymphoma, extranodal and solid organ sites: Secondary | ICD-10-CM

## 2021-08-30 DIAGNOSIS — Z794 Long term (current) use of insulin: Secondary | ICD-10-CM | POA: Diagnosis not present

## 2021-08-30 DIAGNOSIS — E1165 Type 2 diabetes mellitus with hyperglycemia: Secondary | ICD-10-CM | POA: Diagnosis not present

## 2021-09-22 DIAGNOSIS — E1165 Type 2 diabetes mellitus with hyperglycemia: Secondary | ICD-10-CM | POA: Diagnosis not present

## 2021-09-22 DIAGNOSIS — E7800 Pure hypercholesterolemia, unspecified: Secondary | ICD-10-CM | POA: Diagnosis not present

## 2021-09-27 DIAGNOSIS — D1801 Hemangioma of skin and subcutaneous tissue: Secondary | ICD-10-CM | POA: Diagnosis not present

## 2021-09-27 DIAGNOSIS — L570 Actinic keratosis: Secondary | ICD-10-CM | POA: Diagnosis not present

## 2021-09-27 DIAGNOSIS — Z85828 Personal history of other malignant neoplasm of skin: Secondary | ICD-10-CM | POA: Diagnosis not present

## 2021-09-29 DIAGNOSIS — C8590 Non-Hodgkin lymphoma, unspecified, unspecified site: Secondary | ICD-10-CM | POA: Diagnosis not present

## 2021-09-29 DIAGNOSIS — I10 Essential (primary) hypertension: Secondary | ICD-10-CM | POA: Diagnosis not present

## 2021-09-29 DIAGNOSIS — E1165 Type 2 diabetes mellitus with hyperglycemia: Secondary | ICD-10-CM | POA: Diagnosis not present

## 2021-09-29 DIAGNOSIS — E7800 Pure hypercholesterolemia, unspecified: Secondary | ICD-10-CM | POA: Diagnosis not present

## 2021-09-30 DIAGNOSIS — E1165 Type 2 diabetes mellitus with hyperglycemia: Secondary | ICD-10-CM | POA: Diagnosis not present

## 2021-09-30 DIAGNOSIS — Z794 Long term (current) use of insulin: Secondary | ICD-10-CM | POA: Diagnosis not present

## 2021-10-13 DIAGNOSIS — R350 Frequency of micturition: Secondary | ICD-10-CM | POA: Diagnosis not present

## 2021-10-31 DIAGNOSIS — Z794 Long term (current) use of insulin: Secondary | ICD-10-CM | POA: Diagnosis not present

## 2021-10-31 DIAGNOSIS — E1165 Type 2 diabetes mellitus with hyperglycemia: Secondary | ICD-10-CM | POA: Diagnosis not present

## 2021-11-07 ENCOUNTER — Encounter: Payer: Self-pay | Admitting: Hematology & Oncology

## 2021-11-07 ENCOUNTER — Inpatient Hospital Stay: Payer: Medicare Other | Admitting: Hematology & Oncology

## 2021-11-07 ENCOUNTER — Inpatient Hospital Stay: Payer: Medicare Other | Attending: Hematology & Oncology

## 2021-11-07 ENCOUNTER — Other Ambulatory Visit: Payer: Self-pay

## 2021-11-07 VITALS — BP 101/76 | HR 86 | Temp 97.5°F | Resp 19 | Wt 223.0 lb

## 2021-11-07 DIAGNOSIS — Z885 Allergy status to narcotic agent status: Secondary | ICD-10-CM | POA: Insufficient documentation

## 2021-11-07 DIAGNOSIS — Z888 Allergy status to other drugs, medicaments and biological substances status: Secondary | ICD-10-CM | POA: Insufficient documentation

## 2021-11-07 DIAGNOSIS — E119 Type 2 diabetes mellitus without complications: Secondary | ICD-10-CM | POA: Insufficient documentation

## 2021-11-07 DIAGNOSIS — Z79899 Other long term (current) drug therapy: Secondary | ICD-10-CM | POA: Diagnosis not present

## 2021-11-07 DIAGNOSIS — Z87440 Personal history of urinary (tract) infections: Secondary | ICD-10-CM | POA: Diagnosis not present

## 2021-11-07 DIAGNOSIS — C8333 Diffuse large B-cell lymphoma, intra-abdominal lymph nodes: Secondary | ICD-10-CM

## 2021-11-07 DIAGNOSIS — C8339 Diffuse large B-cell lymphoma, extranodal and solid organ sites: Secondary | ICD-10-CM | POA: Insufficient documentation

## 2021-11-07 DIAGNOSIS — Z880 Allergy status to penicillin: Secondary | ICD-10-CM | POA: Insufficient documentation

## 2021-11-07 LAB — CBC WITH DIFFERENTIAL (CANCER CENTER ONLY)
Abs Immature Granulocytes: 0.02 10*3/uL (ref 0.00–0.07)
Basophils Absolute: 0 10*3/uL (ref 0.0–0.1)
Basophils Relative: 1 %
Eosinophils Absolute: 0.2 10*3/uL (ref 0.0–0.5)
Eosinophils Relative: 2 %
HCT: 37 % — ABNORMAL LOW (ref 39.0–52.0)
Hemoglobin: 12.1 g/dL — ABNORMAL LOW (ref 13.0–17.0)
Immature Granulocytes: 0 %
Lymphocytes Relative: 33 %
Lymphs Abs: 2.2 10*3/uL (ref 0.7–4.0)
MCH: 29.2 pg (ref 26.0–34.0)
MCHC: 32.7 g/dL (ref 30.0–36.0)
MCV: 89.4 fL (ref 80.0–100.0)
Monocytes Absolute: 0.8 10*3/uL (ref 0.1–1.0)
Monocytes Relative: 12 %
Neutro Abs: 3.5 10*3/uL (ref 1.7–7.7)
Neutrophils Relative %: 52 %
Platelet Count: 220 10*3/uL (ref 150–400)
RBC: 4.14 MIL/uL — ABNORMAL LOW (ref 4.22–5.81)
RDW: 14.3 % (ref 11.5–15.5)
WBC Count: 6.6 10*3/uL (ref 4.0–10.5)
nRBC: 0 % (ref 0.0–0.2)

## 2021-11-07 LAB — CMP (CANCER CENTER ONLY)
ALT: 19 U/L (ref 0–44)
AST: 18 U/L (ref 15–41)
Albumin: 4.7 g/dL (ref 3.5–5.0)
Alkaline Phosphatase: 88 U/L (ref 38–126)
Anion gap: 7 (ref 5–15)
BUN: 33 mg/dL — ABNORMAL HIGH (ref 8–23)
CO2: 27 mmol/L (ref 22–32)
Calcium: 10 mg/dL (ref 8.9–10.3)
Chloride: 104 mmol/L (ref 98–111)
Creatinine: 1.42 mg/dL — ABNORMAL HIGH (ref 0.61–1.24)
GFR, Estimated: 53 mL/min — ABNORMAL LOW (ref >60)
Glucose, Bld: 163 mg/dL — ABNORMAL HIGH (ref 70–99)
Potassium: 4.8 mmol/L (ref 3.5–5.1)
Sodium: 138 mmol/L (ref 135–145)
Total Bilirubin: 0.6 mg/dL (ref 0.3–1.2)
Total Protein: 7.3 g/dL (ref 6.5–8.1)

## 2021-11-07 LAB — LACTATE DEHYDROGENASE: LDH: 142 U/L (ref 98–192)

## 2021-11-07 NOTE — Progress Notes (Signed)
Hematology and Oncology Follow Up Visit  Patrick Bautista [id number] [birth year] 71 y.o. 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  We last saw him back in March.  At that time, he had surgery for the radiation damage to his genital area.  He still is recovering from this.  He has had a urinary tract infection.  Urology is managing all of this quite nicely.  He is still has a lot of issues with respect to the radiation that he took.  He is trying to work through all of this.  He has had no problems with cough or shortness of breath.  He has had no nausea or vomiting.  He has had no leg swelling.  He is not as active as he would like to be.  His blood sugars have been on the high side because not been as active.  Patrick Bautista does have a nice garden.  He will start cell in his vegetables.  He and his fiance still not yet gotten married.  Hopefully, this will happen within the year.  He has had no fever.  There is been no obvious bleeding.  Overall, I would say his performance status is ECOG 0.    Medications:  Current Outpatient Medications:    acetaminophen (TYLENOL) 500 MG tablet, Take 500-1,000 mg by mouth every 6 (six) hours as needed for moderate pain. , Disp: , Rfl:    amLODipine (NORVASC) 2.5 MG tablet, Take 1 tablet (2.5 mg total) by mouth daily. 2.5 mg + 5 mg =7.5 mg total daily dose, Disp: , Rfl:    amLODipine (NORVASC) 5 MG tablet, Take 1 tablet (5 mg total) by mouth daily. 2.5 mg + 5 mg =7.5 mg total daily dose, Disp: , Rfl: 11   B-D UF III MINI PEN NEEDLES 31G X 5 MM MISC, Inject into the skin 4 (four) times daily., Disp: , Rfl:    clotrimazole-betamethasone (LOTRISONE) cream, SMARTSIG:1 Sparingly Topical Twice Daily,  Disp: , Rfl:    ergocalciferol (VITAMIN D2) 1.25 MG (50000 UT) capsule, Take 1 capsule (50,000 Units total) by mouth 2 (two) times a week. (Patient not taking: Reported on 09/16/2020), Disp: 24 capsule, Rfl: 2   fluocinonide cream (LIDEX) 4.09 %, Apply 1 application topically 2 (two) times daily as needed (to access port).  (Patient not taking: Reported on 03/24/2020), Disp: , Rfl:    guaiFENesin-dextromethorphan (ROBITUSSIN DM) 100-10 MG/5ML syrup, Take 5 mLs by mouth every 4 (four) hours as needed for cough. (Patient not taking: Reported on 03/24/2020), Disp: 118 mL, Rfl: 0   HUMALOG KWIKPEN 100 UNIT/ML KwikPen, Inject 1-5 Units into the skin See admin instructions. Sliding scale : up to 4 times a day., Disp: , Rfl:    lidocaine-prilocaine (EMLA) cream, Apply to affected area once (Patient not taking: Reported on 01/16/2020), Disp: 30 g, Rfl: 3   loratadine (CLARITIN) 10 MG tablet, Take 1 tablet (10 mg total) by mouth See admin instructions. Patient states he takes for 7 days: from 3rd day post chemo until 10th day post chemo (Patient not taking: Reported on 09/16/2020), Disp: 28 tablet, Rfl: 0   LORazepam (ATIVAN) 0.5 MG tablet, TAKE 1 TABLET BY MOUTH EVERY  6 HOURS AS NEEDED (NAUSEA OR VOMITING). (Patient not taking: Reported on 09/16/2020), Disp: 30 tablet, Rfl: 0   olmesartan-hydrochlorothiazide (BENICAR HCT) 40-25 MG tablet, Take 1 tablet by mouth daily., Disp: , Rfl:    pantoprazole (PROTONIX) 40 MG tablet, TAKE 1 TABLET BY MOUTH EVERY DAY BEFORE BREAKFAST, Disp: 90 tablet, Rfl: 1   prochlorperazine (COMPAZINE) 10 MG tablet, Take 1 tablet (10 mg total) by mouth every 6 (six) hours as needed (Nausea or vomiting). (Patient not taking: Reported on 01/16/2020), Disp: 30 tablet, Rfl: 6   rosuvastatin (CRESTOR) 10 MG tablet, Take 10 mg by mouth daily., Disp: , Rfl:    senna-docusate (SENNA S) 8.6-50 MG tablet, Take 2 tablets by mouth at bedtime as needed for mild constipation or moderate constipation. (Patient  not taking: Reported on 03/24/2020), Disp: 60 tablet, Rfl: 1   traMADol (ULTRAM) 50 MG tablet, Take 1 tablet (50 mg total) by mouth every 6 (six) hours as needed., Disp: 60 tablet, Rfl: 0   TRESIBA FLEXTOUCH 100 UNIT/ML FlexTouch Pen, Inject 40 Units into the skin daily. , Disp: , Rfl:   Allergies:  Allergies  Allergen Reactions   Penicillin G Benzathine Itching   Codeine Itching   Hydrocodone Nausea And Vomiting   Hydrocodone-Acetaminophen Nausea And Vomiting   Oxycodone Hcl Nausea And Vomiting   Penicillins Itching    Has patient had a PCN reaction causing immediate rash, facial/tongue/throat swelling, SOB or lightheadedness with hypotension: No Has patient had a PCN reaction causing severe rash involving mucus membranes or skin necrosis: No Has patient had a PCN reaction that required hospitalization No Has patient had a PCN reaction occurring within the last 10 years: No If all of the above answers are "NO", then may proceed with Cephalosporin use.    Percocet [Oxycodone-Acetaminophen] Nausea And Vomiting   Tape Itching and Rash     Paper Tape causes blisters; please use Coban wrap if possible    Past Medical History, Surgical history, Social history, and Family History were reviewed and updated.  Review of Systems: Review of Systems  Constitutional: Negative.   HENT:  Negative.    Eyes: Negative.   Respiratory: Negative.    Cardiovascular: Negative.   Gastrointestinal: Negative.   Endocrine: Negative.   Genitourinary: Negative.    Musculoskeletal: Negative.   Skin: Negative.   Neurological: Negative.   Hematological: Negative.   Psychiatric/Behavioral: Negative.      Physical Exam:  weight is 223 lb (101.2 kg). His oral temperature is 97.5 F (36.4 C) (abnormal). His blood pressure is 101/76 and his pulse is 86. His respiration is 19 and oxygen saturation is 100%.   Wt Readings from Last 3 Encounters:  11/07/21 223 lb (101.2 kg)  07/18/21 227 lb 12.8 oz (103.3 kg)   03/14/21 221 lb (100.2 kg)    Physical Exam Vitals reviewed.  HENT:     Head: Normocephalic and atraumatic.  Eyes:     Pupils: Pupils are equal, round, and reactive to light.  Cardiovascular:     Rate and Rhythm: Normal rate and regular rhythm.     Heart sounds: Normal heart sounds.  Pulmonary:     Effort: Pulmonary effort is normal.     Breath sounds: Normal breath sounds.  Abdominal:     General: Bowel sounds are normal.     Palpations: Abdomen is soft.  Musculoskeletal:        General: No tenderness or deformity. Normal range of motion.     Cervical back: Normal  range of motion.  Lymphadenopathy:     Cervical: No cervical adenopathy.  Skin:    General: Skin is warm and dry.     Findings: No erythema or rash.  Neurological:     Mental Status: He is alert and oriented to person, place, and time.  Psychiatric:        Behavior: Behavior normal.        Thought Content: Thought content normal.        Judgment: Judgment normal.     Lab Results  Component Value Date   WBC 6.6 11/07/2021   HGB 12.1 (L) 11/07/2021   HCT 37.0 (L) 11/07/2021   MCV 89.4 11/07/2021   PLT 220 11/07/2021     Chemistry      Component Value Date/Time   NA 138 11/07/2021 0923   K 4.8 11/07/2021 0923   CL 104 11/07/2021 0923   CO2 27 11/07/2021 0923   BUN 33 (H) 11/07/2021 0923   CREATININE 1.42 (H) 11/07/2021 0923      Component Value Date/Time   CALCIUM 10.0 11/07/2021 0923   ALKPHOS 88 11/07/2021 0923   AST 18 11/07/2021 0923   ALT 19 11/07/2021 0923   BILITOT 0.6 11/07/2021 0923      Impression and Plan: Mr. Melott is a very nice 71 year old white male.  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  He had this removed in May.  He then underwent systemic chemotherapy along with intrathecal methotrexate.  He then underwent inguinal radiation therapy.  Again, he is trying to work through the radiation damage that he incurred in the genital area.  Hopefully, he will be  able to have normal functioning and hopefully he will be able to have relations with his fiance.  I know that he is trying hard.  I know that he is trying his best to stay active.  I know he has diabetes.  Hopefully this will improve as he becomes more active.  I do not see any evidence of recurrence of his lymphoma.  As such, I do not think we have to do any scans on him.  I will plan to get him back in another 3 months.    Volanda Napoleon, MD 7/17/202310:39 AM

## 2021-11-14 ENCOUNTER — Other Ambulatory Visit: Payer: Self-pay

## 2021-11-14 DIAGNOSIS — R350 Frequency of micturition: Secondary | ICD-10-CM | POA: Diagnosis not present

## 2021-11-21 ENCOUNTER — Ambulatory Visit: Payer: Self-pay

## 2021-11-21 NOTE — Patient Outreach (Signed)
  Care Coordination   Visit Note   11/21/2021 Name: Taiden Raybourn MRN: [id number] DOB: 04-16-1951  Conal Shetley is a 71 y.o. year old male who sees [place], L.[name], MD for primary care. I spoke with  Sheela Stack by phone today.    Follow up plan:  Appointment scheduled for December 07, 2021.  Encounter Outcome:  Pt. Scheduled  [facility] [phone]

## 2021-12-02 DIAGNOSIS — Z794 Long term (current) use of insulin: Secondary | ICD-10-CM | POA: Diagnosis not present

## 2021-12-02 DIAGNOSIS — E1165 Type 2 diabetes mellitus with hyperglycemia: Secondary | ICD-10-CM | POA: Diagnosis not present

## 2021-12-05 IMAGING — DX DG CHEST 1V PORT
1 series · 1 of 1 positions shown · non-contrast
Comparison: Portable chest 01/17/2020 and earlier.

CLINICAL DATA: 70-year-old male with syncope. Hypotension and
hypoglycemia. Recently diagnosed with [id number].

EXAM:
PORTABLE CHEST 1 VIEW

[chest ap]
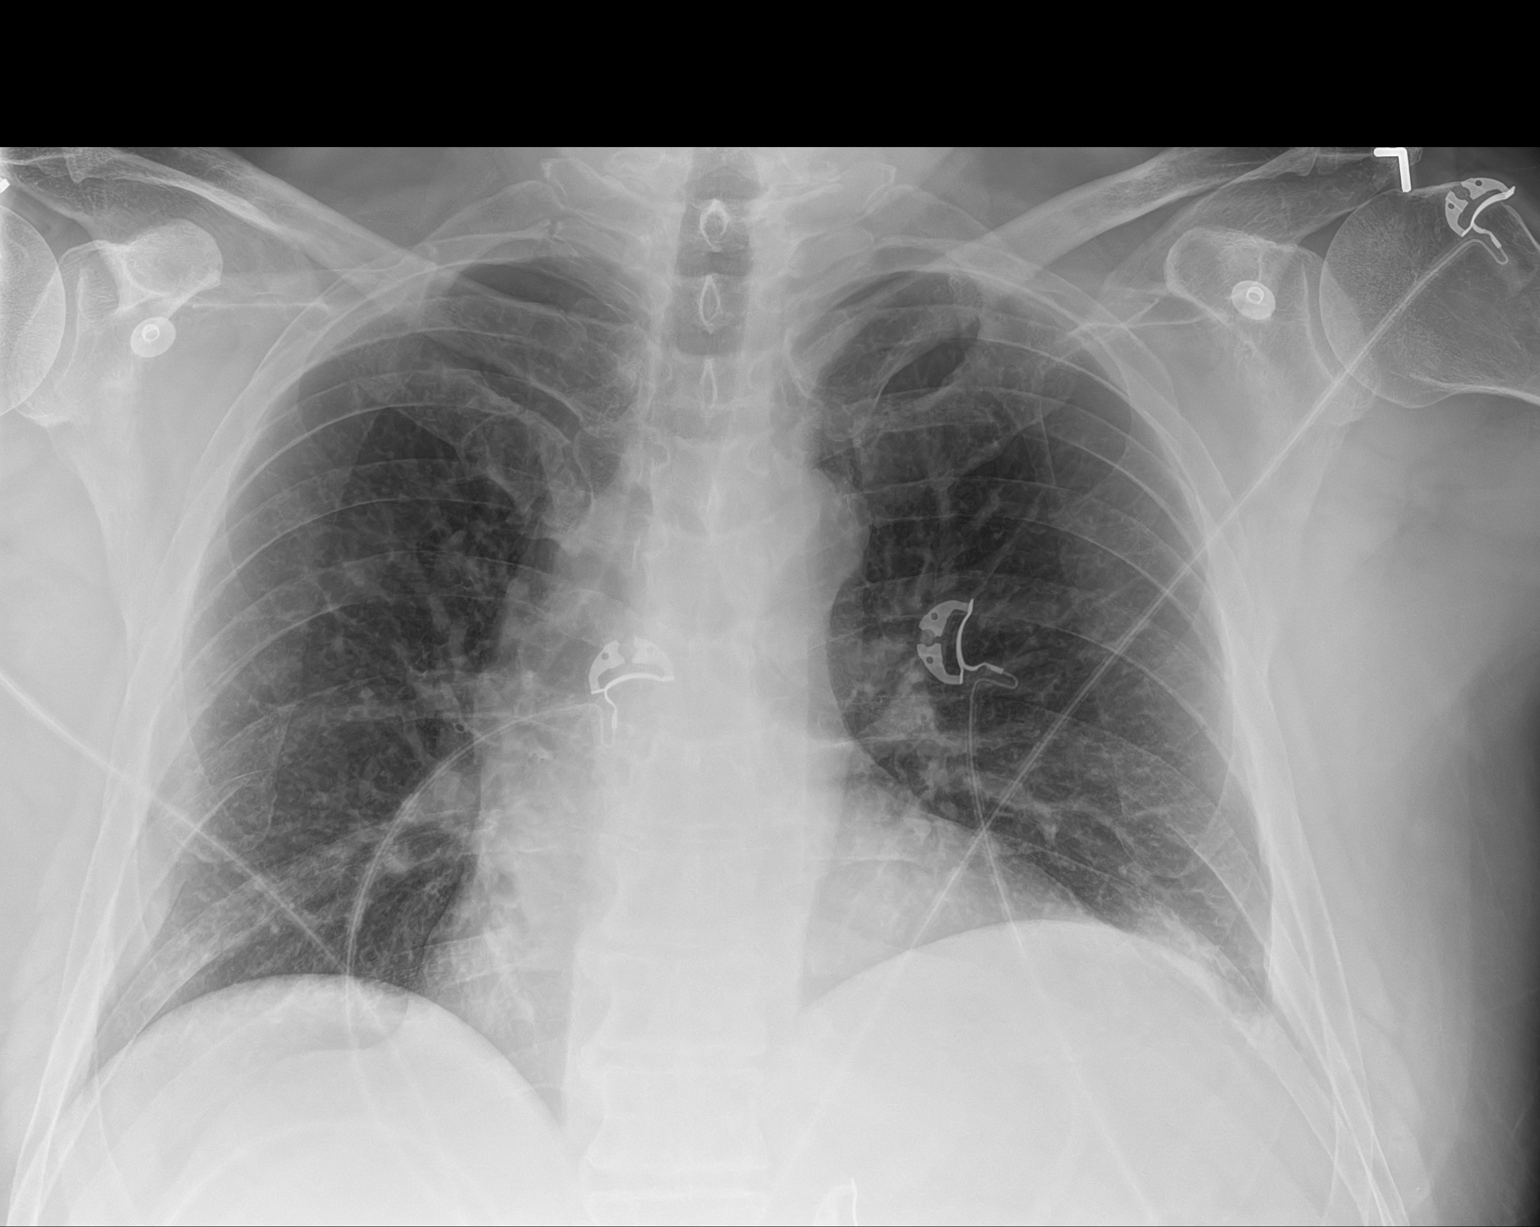

[1 of 1 positions shown; findings below may reference images not displayed]

FINDINGS: Portable AP upright view at 8800 hours. Right chest Port-A-Cath has
been removed since last year. Stable somewhat low lung volumes.
Normal cardiac size and mediastinal contours. Visualized tracheal
air column is within normal limits. Allowing for portable technique
the lungs are clear. No pneumothorax or pleural effusion. No acute
osseous abnormality identified.
IMPRESSION: No acute cardiopulmonary abnormality.

## 2021-12-07 ENCOUNTER — Ambulatory Visit: Payer: Self-pay

## 2021-12-07 NOTE — Patient Outreach (Signed)
  Care Coordination   Initial Visit Note   12/07/2021 Name: Patrick Bautista MRN: [id number] DOB: [birth year]  Patrick Bautista is a 71 y.o. year old male who sees No primary care provider on file. for primary care. I spoke with  Patrick Bautista by phone today  What matters to the patients health and wellness today?  Health Maintenance    Goals Addressed             This Visit's Progress    Health Maintenance       Care Coordination Interventions: Reviewed plan for medication and disease management. Reports adhering to plan and attending specialty appointments as scheduled. Reports multiple concerns r/t symptoms leading to hospital admission for sepsis. Also voiced concerns r/t radiation complications. Reports discussing with assigned specialty providers and [facility] administrative staff. Reviewed care gaps and AWV. Several care gaps noted. AWV has not been performed since May 2020. Patient reports not being evaluated by PCP d/t labs and multiple visits with the Oncology and specialty teams. Offered to assist with PCP appointment scheduling. Patient declined. Prefers to contact the PCP clinic at a later time d/t multiple pending appointments.          SDOH assessments and interventions completed:  Yes  SDOH Interventions Today    Flowsheet Row Most Recent Value  SDOH Interventions   Food Insecurity Interventions Intervention Not Indicated  Transportation Interventions Intervention Not Indicated        Care Coordination Interventions Activated:  Yes  Care Coordination Interventions:  Yes, provided   Follow up plan:  [name] will call for care coordination assistance as needed.    Encounter Outcome:  Pt. Visit Completed    [facility] [place] [facility] [phone]

## 2021-12-07 NOTE — Patient Instructions (Signed)
Visit Information Thank you for allowing the Care Management team to participate in your care. It was great speaking with you!   Following are the goals we discussed today:   Goals Addressed             This Visit's Progress    Health Maintenance       Care Coordination Interventions: Reviewed plan for medication and disease management. Reports adhering to plan and attending specialty appointments as scheduled. Reports multiple concerns r/t symptoms leading to hospital admission for sepsis. Also voiced concerns r/t radiation complications. Reports discussing with assigned specialty providers and [facility] administrative staff. Reviewed care gaps and AWV. Several care gaps noted. AWV has not been performed since May 2020. Patient reports not being evaluated by PCP d/t labs and multiple visits with the Oncology and specialty teams. Offered to assist with PCP appointment scheduling. Patient declined. Prefers to contact the PCP clinic at a later time d/t multiple pending appointments.         Mr. Roddey verbalized understanding of the information discussed during the telephonic outreach today. Declined need for mailed instructions or resources.   Mr. Derner will call for care coordination assistance as needed.  [facility] [phone]

## 2021-12-23 ENCOUNTER — Other Ambulatory Visit: Payer: Self-pay

## 2022-01-02 DIAGNOSIS — R3912 Poor urinary stream: Secondary | ICD-10-CM | POA: Diagnosis not present

## 2022-01-02 DIAGNOSIS — R3913 Splitting of urinary stream: Secondary | ICD-10-CM | POA: Diagnosis not present

## 2022-01-24 DIAGNOSIS — N35819 Other urethral stricture, male, unspecified site: Secondary | ICD-10-CM | POA: Diagnosis not present

## 2022-01-30 DIAGNOSIS — E1165 Type 2 diabetes mellitus with hyperglycemia: Secondary | ICD-10-CM | POA: Diagnosis not present

## 2022-01-30 DIAGNOSIS — C8590 Non-Hodgkin lymphoma, unspecified, unspecified site: Secondary | ICD-10-CM | POA: Diagnosis not present

## 2022-01-30 DIAGNOSIS — I10 Essential (primary) hypertension: Secondary | ICD-10-CM | POA: Diagnosis not present

## 2022-01-30 DIAGNOSIS — E7800 Pure hypercholesterolemia, unspecified: Secondary | ICD-10-CM | POA: Diagnosis not present

## 2022-02-09 ENCOUNTER — Other Ambulatory Visit: Payer: Self-pay

## 2022-02-09 ENCOUNTER — Encounter: Payer: Self-pay | Admitting: Hematology & Oncology

## 2022-02-09 ENCOUNTER — Inpatient Hospital Stay: Payer: Medicare Other | Attending: Hematology & Oncology

## 2022-02-09 ENCOUNTER — Inpatient Hospital Stay: Payer: Medicare Other | Admitting: Hematology & Oncology

## 2022-02-09 VITALS — BP 116/62 | HR 98 | Temp 97.0°F | Resp 19 | Ht 70.0 in | Wt 230.0 lb

## 2022-02-09 DIAGNOSIS — L539 Erythematous condition, unspecified: Secondary | ICD-10-CM | POA: Diagnosis not present

## 2022-02-09 DIAGNOSIS — D649 Anemia, unspecified: Secondary | ICD-10-CM | POA: Insufficient documentation

## 2022-02-09 DIAGNOSIS — Z888 Allergy status to other drugs, medicaments and biological substances status: Secondary | ICD-10-CM | POA: Diagnosis not present

## 2022-02-09 DIAGNOSIS — Z885 Allergy status to narcotic agent status: Secondary | ICD-10-CM | POA: Insufficient documentation

## 2022-02-09 DIAGNOSIS — Z880 Allergy status to penicillin: Secondary | ICD-10-CM | POA: Diagnosis not present

## 2022-02-09 DIAGNOSIS — C8339 Diffuse large B-cell lymphoma, extranodal and solid organ sites: Secondary | ICD-10-CM | POA: Diagnosis not present

## 2022-02-09 DIAGNOSIS — C8333 Diffuse large B-cell lymphoma, intra-abdominal lymph nodes: Secondary | ICD-10-CM

## 2022-02-09 DIAGNOSIS — Z79899 Other long term (current) drug therapy: Secondary | ICD-10-CM | POA: Diagnosis not present

## 2022-02-09 LAB — CBC WITH DIFFERENTIAL (CANCER CENTER ONLY)
Abs Immature Granulocytes: 0.03 10*3/uL (ref 0.00–0.07)
Basophils Absolute: 0 10*3/uL (ref 0.0–0.1)
Basophils Relative: 0 %
Eosinophils Absolute: 0.2 10*3/uL (ref 0.0–0.5)
Eosinophils Relative: 3 %
HCT: 38.2 % — ABNORMAL LOW (ref 39.0–52.0)
Hemoglobin: 12.5 g/dL — ABNORMAL LOW (ref 13.0–17.0)
Immature Granulocytes: 0 %
Lymphocytes Relative: 35 %
Lymphs Abs: 3 10*3/uL (ref 0.7–4.0)
MCH: 28.9 pg (ref 26.0–34.0)
MCHC: 32.7 g/dL (ref 30.0–36.0)
MCV: 88.4 fL (ref 80.0–100.0)
Monocytes Absolute: 0.9 10*3/uL (ref 0.1–1.0)
Monocytes Relative: 11 %
Neutro Abs: 4.3 10*3/uL (ref 1.7–7.7)
Neutrophils Relative %: 51 %
Platelet Count: 269 10*3/uL (ref 150–400)
RBC: 4.32 MIL/uL (ref 4.22–5.81)
RDW: 13.8 % (ref 11.5–15.5)
WBC Count: 8.5 10*3/uL (ref 4.0–10.5)
nRBC: 0 % (ref 0.0–0.2)

## 2022-02-09 LAB — CMP (CANCER CENTER ONLY)
ALT: 19 U/L (ref 0–44)
AST: 18 U/L (ref 15–41)
Albumin: 4.6 g/dL (ref 3.5–5.0)
Alkaline Phosphatase: 116 U/L (ref 38–126)
Anion gap: 8 (ref 5–15)
BUN: 28 mg/dL — ABNORMAL HIGH (ref 8–23)
CO2: 27 mmol/L (ref 22–32)
Calcium: 9.7 mg/dL (ref 8.9–10.3)
Chloride: 103 mmol/L (ref 98–111)
Creatinine: 1.57 mg/dL — ABNORMAL HIGH (ref 0.61–1.24)
GFR, Estimated: 47 mL/min — ABNORMAL LOW (ref >60)
Glucose, Bld: 169 mg/dL — ABNORMAL HIGH (ref 70–99)
Potassium: 4.3 mmol/L (ref 3.5–5.1)
Sodium: 138 mmol/L (ref 135–145)
Total Bilirubin: 0.4 mg/dL (ref 0.3–1.2)
Total Protein: 7.4 g/dL (ref 6.5–8.1)

## 2022-02-09 LAB — LACTATE DEHYDROGENASE: LDH: 165 U/L (ref 98–192)

## 2022-02-09 NOTE — Progress Notes (Signed)
Hematology and Oncology Follow Up Visit  Patrick Bautista [id number] 10/07/1950 71 y.o. 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  He is managing okay.  He still dealing with the side effects from the radiation to his genital area.  I know he has had Urology see him.  I know they have operated on him.  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  As far as his lymphoma is concerned, he is doing well with this.  There is no evidence of recurrent lymphoma.  He has had no cough or shortness of breath.  He has had no headache.  He has had no fever.  There has been no leg swelling.  He has had no rashes.  He has not noted any swollen lymph nodes.  Overall, I would say that his performance status is probably ECOG 1.     Medications:  Current Outpatient Medications:    acetaminophen (TYLENOL) 500 MG tablet, Take 500-1,000 mg by mouth every 6 (six) hours as needed for moderate pain. , Disp: , Rfl:    amLODipine (NORVASC) 2.5 MG tablet, Take 1 tablet (2.5 mg total) by mouth daily. 2.5 mg + 5 mg =7.5 mg total daily dose, Disp: , Rfl:    amLODipine (NORVASC) 5 MG tablet, Take 1 tablet (5 mg total) by mouth daily. 2.5 mg + 5 mg =7.5 mg total daily dose, Disp: , Rfl: 11   B-D UF III MINI PEN NEEDLES 31G X 5 MM MISC, Inject into the skin 4 (four) times daily., Disp: , Rfl:    clotrimazole-betamethasone (LOTRISONE) cream, SMARTSIG:1 Sparingly Topical Twice Daily, Disp: , Rfl:    ergocalciferol (VITAMIN D2) 1.25 MG (50000 UT) capsule, Take 1 capsule (50,000 Units total) by mouth 2 (two) times a week. (Patient not taking: Reported on 09/16/2020), Disp: 24 capsule, Rfl: 2   fluocinonide cream (LIDEX) 6.29 %, Apply 1 application  topically 2 (two) times daily as needed (to access port).  (Patient not taking: Reported on 03/24/2020), Disp: , Rfl:    guaiFENesin-dextromethorphan (ROBITUSSIN DM) 100-10 MG/5ML syrup, Take 5 mLs by mouth every 4 (four) hours as needed for cough. (Patient not taking: Reported on 03/24/2020), Disp: 118 mL, Rfl: 0   HUMALOG KWIKPEN 100 UNIT/ML KwikPen, Inject 1-5 Units into the skin See admin instructions. Sliding scale : up to 4 times a day., Disp: , Rfl:    lidocaine-prilocaine (EMLA) cream, Apply to affected area once (Patient not taking: Reported on 01/16/2020), Disp: 30 g, Rfl: 3   loratadine (CLARITIN) 10 MG tablet, Take 1 tablet (10 mg total) by mouth See admin instructions. Patient states he takes for 7 days: from 3rd day post chemo until 10th day post chemo (Patient not taking: Reported on 09/16/2020), Disp: 28 tablet, Rfl: 0   LORazepam (ATIVAN) 0.5 MG tablet, TAKE 1 TABLET BY MOUTH EVERY 6 HOURS AS NEEDED (NAUSEA OR VOMITING). (Patient not taking: Reported on 09/16/2020), Disp: 30 tablet, Rfl: 0   olmesartan-hydrochlorothiazide (BENICAR HCT) 40-25 MG tablet, Take 1 tablet by mouth daily., Disp: , Rfl:    pantoprazole (PROTONIX) 40 MG tablet, TAKE 1 TABLET BY MOUTH EVERY DAY BEFORE BREAKFAST, Disp: 90 tablet, Rfl: 1  prochlorperazine (COMPAZINE) 10 MG tablet, Take 1 tablet (10 mg total) by mouth every 6 (six) hours as needed (Nausea or vomiting). (Patient not taking: Reported on 01/16/2020), Disp: 30 tablet, Rfl: 6   rosuvastatin (CRESTOR) 10 MG tablet, Take 10 mg by mouth daily., Disp: , Rfl:    senna-docusate (SENNA S) 8.6-50 MG tablet, Take 2 tablets by mouth at bedtime as needed for mild constipation or moderate constipation. (Patient not taking: Reported on 03/24/2020), Disp: 60 tablet, Rfl: 1   traMADol (ULTRAM) 50 MG tablet, Take 1 tablet (50 mg total) by mouth every 6 (six) hours as needed., Disp: 60 tablet, Rfl: 0   TRESIBA FLEXTOUCH 100 UNIT/ML FlexTouch Pen, Inject 40 Units into the skin  daily. , Disp: , Rfl:   Allergies:  Allergies  Allergen Reactions   Penicillin G Benzathine Itching   Codeine Itching   Hydrocodone Nausea And Vomiting   Hydrocodone-Acetaminophen Nausea And Vomiting   Oxycodone Hcl Nausea And Vomiting   Penicillins Itching    Has patient had a PCN reaction causing immediate rash, facial/tongue/throat swelling, SOB or lightheadedness with hypotension: No Has patient had a PCN reaction causing severe rash involving mucus membranes or skin necrosis: No Has patient had a PCN reaction that required hospitalization No Has patient had a PCN reaction occurring within the last 10 years: No If all of the above answers are "NO", then may proceed with Cephalosporin use.    Percocet [Oxycodone-Acetaminophen] Nausea And Vomiting   Tape Itching and Rash     Paper Tape causes blisters; please use Coban wrap if possible    Past Medical History, Surgical history, Social history, and Family History were reviewed and updated.  Review of Systems: Review of Systems  Constitutional: Negative.   HENT:  Negative.    Eyes: Negative.   Respiratory: Negative.    Cardiovascular: Negative.   Gastrointestinal: Negative.   Endocrine: Negative.   Genitourinary: Negative.    Musculoskeletal: Negative.   Skin: Negative.   Neurological: Negative.   Hematological: Negative.   Psychiatric/Behavioral: Negative.      Physical Exam:  height is '5\' 10"'$  (1.778 m) and weight is 230 lb (104.3 kg). His oral temperature is 97 F (36.1 C) (abnormal). His blood pressure is 116/62 and his pulse is 98. His respiration is 19 and oxygen saturation is 100%.   Wt Readings from Last 3 Encounters:  02/09/22 230 lb (104.3 kg)  11/07/21 223 lb (101.2 kg)  07/18/21 227 lb 12.8 oz (103.3 kg)    Physical Exam Vitals reviewed.  HENT:     Head: Normocephalic and atraumatic.  Eyes:     Pupils: Pupils are equal, round, and reactive to light.  Cardiovascular:     Rate and Rhythm: Normal rate  and regular rhythm.     Heart sounds: Normal heart sounds.  Pulmonary:     Effort: Pulmonary effort is normal.     Breath sounds: Normal breath sounds.  Abdominal:     General: Bowel sounds are normal.     Palpations: Abdomen is soft.  Genitourinary:    Comments: His genital exam does show some erythema in the scrotal area.  He does have some erythema on the penile shaft.  I do not see any skin breakdown. Musculoskeletal:        General: No tenderness or deformity. Normal range of motion.     Cervical back: Normal range of motion.  Lymphadenopathy:     Cervical: No cervical adenopathy.  Skin:  General: Skin is warm and dry.     Findings: No erythema or rash.  Neurological:     Mental Status: He is alert and oriented to person, place, and time.  Psychiatric:        Behavior: Behavior normal.        Thought Content: Thought content normal.        Judgment: Judgment normal.      Lab Results  Component Value Date   WBC 8.5 02/09/2022   HGB 12.5 (L) 02/09/2022   HCT 38.2 (L) 02/09/2022   MCV 88.4 02/09/2022   PLT 269 02/09/2022     Chemistry      Component Value Date/Time   NA 138 11/07/2021 0923   K 4.8 11/07/2021 0923   CL 104 11/07/2021 0923   CO2 27 11/07/2021 0923   BUN 33 (H) 11/07/2021 0923   CREATININE 1.42 (H) 11/07/2021 0923      Component Value Date/Time   CALCIUM 10.0 11/07/2021 0923   ALKPHOS 88 11/07/2021 0923   AST 18 11/07/2021 0923   ALT 19 11/07/2021 0923   BILITOT 0.6 11/07/2021 0923      Impression and Plan: Mr. Bisceglia is a very nice 71 year old white male.  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  He had this removed in May.  He then underwent systemic chemotherapy along with intrathecal methotrexate.  He then underwent inguinal radiation therapy.  Again, he is trying to work through the radiation damage that he incurred in the genital area.  Hopefully, he will be able to have normal functioning and hopefully he will be  able to have relations with his fiance.  Again, I do not see any evidence of recurrence of his lymphoma.  I do not see that we have to do any scans on him.  He is slightly anemic.  He does have some slight renal insufficiency.  This allowed to be watched.  I suspect this probably is from diabetes.  We will still plan to get him back in about 3-4 months.  We will get him through the holiday season.    Volanda Napoleon, MD 10/19/20239:42 AM

## 2022-02-10 ENCOUNTER — Other Ambulatory Visit: Payer: Self-pay

## 2022-02-11 ENCOUNTER — Other Ambulatory Visit: Payer: Self-pay

## 2022-02-21 DIAGNOSIS — Z794 Long term (current) use of insulin: Secondary | ICD-10-CM | POA: Diagnosis not present

## 2022-02-21 DIAGNOSIS — E1165 Type 2 diabetes mellitus with hyperglycemia: Secondary | ICD-10-CM | POA: Diagnosis not present

## 2022-02-24 DIAGNOSIS — J0140 Acute pansinusitis, unspecified: Secondary | ICD-10-CM | POA: Diagnosis not present

## 2022-02-24 DIAGNOSIS — R051 Acute cough: Secondary | ICD-10-CM | POA: Diagnosis not present

## 2022-02-28 ENCOUNTER — Other Ambulatory Visit: Payer: Self-pay

## 2022-03-06 ENCOUNTER — Other Ambulatory Visit: Payer: Self-pay | Admitting: Hematology & Oncology

## 2022-03-06 DIAGNOSIS — C8339 Diffuse large B-cell lymphoma, extranodal and solid organ sites: Secondary | ICD-10-CM

## 2022-03-29 DIAGNOSIS — D1801 Hemangioma of skin and subcutaneous tissue: Secondary | ICD-10-CM | POA: Diagnosis not present

## 2022-03-29 DIAGNOSIS — C44311 Basal cell carcinoma of skin of nose: Secondary | ICD-10-CM | POA: Diagnosis not present

## 2022-03-29 DIAGNOSIS — C44319 Basal cell carcinoma of skin of other parts of face: Secondary | ICD-10-CM | POA: Diagnosis not present

## 2022-03-29 DIAGNOSIS — D485 Neoplasm of uncertain behavior of skin: Secondary | ICD-10-CM | POA: Diagnosis not present

## 2022-03-29 DIAGNOSIS — C44622 Squamous cell carcinoma of skin of right upper limb, including shoulder: Secondary | ICD-10-CM | POA: Diagnosis not present

## 2022-03-29 DIAGNOSIS — Z85828 Personal history of other malignant neoplasm of skin: Secondary | ICD-10-CM | POA: Diagnosis not present

## 2022-03-29 DIAGNOSIS — L309 Dermatitis, unspecified: Secondary | ICD-10-CM | POA: Diagnosis not present

## 2022-03-29 DIAGNOSIS — L570 Actinic keratosis: Secondary | ICD-10-CM | POA: Diagnosis not present

## 2022-04-11 ENCOUNTER — Other Ambulatory Visit: Payer: Self-pay | Admitting: Pharmacist

## 2022-05-15 DIAGNOSIS — C44311 Basal cell carcinoma of skin of nose: Secondary | ICD-10-CM | POA: Diagnosis not present

## 2022-05-15 DIAGNOSIS — C44321 Squamous cell carcinoma of skin of nose: Secondary | ICD-10-CM | POA: Diagnosis not present

## 2022-05-18 DIAGNOSIS — Z794 Long term (current) use of insulin: Secondary | ICD-10-CM | POA: Diagnosis not present

## 2022-05-18 DIAGNOSIS — E1165 Type 2 diabetes mellitus with hyperglycemia: Secondary | ICD-10-CM | POA: Diagnosis not present

## 2022-05-22 DIAGNOSIS — L570 Actinic keratosis: Secondary | ICD-10-CM | POA: Diagnosis not present

## 2022-05-23 ENCOUNTER — Inpatient Hospital Stay: Payer: Medicare Other | Attending: Hematology & Oncology

## 2022-05-23 ENCOUNTER — Inpatient Hospital Stay (HOSPITAL_BASED_OUTPATIENT_CLINIC_OR_DEPARTMENT_OTHER): Payer: Medicare Other | Admitting: Hematology & Oncology

## 2022-05-23 ENCOUNTER — Encounter: Payer: Self-pay | Admitting: Hematology & Oncology

## 2022-05-23 ENCOUNTER — Other Ambulatory Visit: Payer: Self-pay

## 2022-05-23 VITALS — BP 99/73 | HR 89 | Temp 98.1°F | Resp 18 | Ht 70.0 in | Wt 234.0 lb

## 2022-05-23 DIAGNOSIS — E119 Type 2 diabetes mellitus without complications: Secondary | ICD-10-CM | POA: Insufficient documentation

## 2022-05-23 DIAGNOSIS — C8339 Diffuse large B-cell lymphoma, extranodal and solid organ sites: Secondary | ICD-10-CM | POA: Insufficient documentation

## 2022-05-23 DIAGNOSIS — C8333 Diffuse large B-cell lymphoma, intra-abdominal lymph nodes: Secondary | ICD-10-CM | POA: Diagnosis not present

## 2022-05-23 DIAGNOSIS — Z885 Allergy status to narcotic agent status: Secondary | ICD-10-CM | POA: Diagnosis not present

## 2022-05-23 DIAGNOSIS — Z880 Allergy status to penicillin: Secondary | ICD-10-CM | POA: Insufficient documentation

## 2022-05-23 DIAGNOSIS — Z79899 Other long term (current) drug therapy: Secondary | ICD-10-CM | POA: Insufficient documentation

## 2022-05-23 LAB — LACTATE DEHYDROGENASE: LDH: 131 U/L (ref 98–192)

## 2022-05-23 LAB — CBC WITH DIFFERENTIAL (CANCER CENTER ONLY)
Abs Immature Granulocytes: 0.04 10*3/uL (ref 0.00–0.07)
Basophils Absolute: 0.1 10*3/uL (ref 0.0–0.1)
Basophils Relative: 1 %
Eosinophils Absolute: 0.3 10*3/uL (ref 0.0–0.5)
Eosinophils Relative: 3 %
HCT: 38.8 % — ABNORMAL LOW (ref 39.0–52.0)
Hemoglobin: 12.6 g/dL — ABNORMAL LOW (ref 13.0–17.0)
Immature Granulocytes: 0 %
Lymphocytes Relative: 36 %
Lymphs Abs: 3.4 10*3/uL (ref 0.7–4.0)
MCH: 29 pg (ref 26.0–34.0)
MCHC: 32.5 g/dL (ref 30.0–36.0)
MCV: 89.2 fL (ref 80.0–100.0)
Monocytes Absolute: 0.8 10*3/uL (ref 0.1–1.0)
Monocytes Relative: 8 %
Neutro Abs: 4.9 10*3/uL (ref 1.7–7.7)
Neutrophils Relative %: 52 %
Platelet Count: 280 10*3/uL (ref 150–400)
RBC: 4.35 MIL/uL (ref 4.22–5.81)
RDW: 13.5 % (ref 11.5–15.5)
WBC Count: 9.4 10*3/uL (ref 4.0–10.5)
nRBC: 0 % (ref 0.0–0.2)

## 2022-05-23 LAB — CMP (CANCER CENTER ONLY)
ALT: 20 U/L (ref 0–44)
AST: 19 U/L (ref 15–41)
Albumin: 4.6 g/dL (ref 3.5–5.0)
Alkaline Phosphatase: 86 U/L (ref 38–126)
Anion gap: 8 (ref 5–15)
BUN: 34 mg/dL — ABNORMAL HIGH (ref 8–23)
CO2: 28 mmol/L (ref 22–32)
Calcium: 10.2 mg/dL (ref 8.9–10.3)
Chloride: 102 mmol/L (ref 98–111)
Creatinine: 1.67 mg/dL — ABNORMAL HIGH (ref 0.61–1.24)
GFR, Estimated: 43 mL/min — ABNORMAL LOW (ref >60)
Glucose, Bld: 134 mg/dL — ABNORMAL HIGH (ref 70–99)
Potassium: 4.5 mmol/L (ref 3.5–5.1)
Sodium: 138 mmol/L (ref 135–145)
Total Bilirubin: 0.5 mg/dL (ref 0.3–1.2)
Total Protein: 7.3 g/dL (ref 6.5–8.1)

## 2022-05-23 NOTE — Progress Notes (Signed)
Hematology and Oncology Follow Up Visit  Patrick Bautista [id number] 02-Jun-1950 72 y.o. 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  We last saw him back in October.  Since then, he has been doing okay.  He still has the same issues with respect to his genital area.  I just feel bad that he is having such difficulty.  He has had radiation damage.  It sounds like this is some that is not going to be reversed.  He is still waiting to get married.  He is a little bit trouble that he cannot have "proper marital relations" because of the radiation injury.  Otherwise he is doing okay.  His diabetes is on the higher side.  He says he cannot walk at all that much because of the irritation that happens when he walks down in the scrotal area.  He has had some difficulty with urination and bowel movements.  He needs to take a lot of laxatives so that he does not get constipated.  He has had no fever.  Has had no cough or shortness of breath.  He has had no leg swelling.  He did have some nasal surgery because of skin cancer.  He just had the sutures taken out.  I am unsure if he had squamous cell or basal cell.  He has had multiple skin lesions frozen off.    His appetite is doing okay.  He has had no nausea or vomiting.  There has been no bleeding.  Overall, his performance status is ECOG 1.     Medications:  Current Outpatient Medications:    acetaminophen (TYLENOL) 500 MG tablet, Take 500-1,000 mg by mouth every 6 (six) hours as needed for moderate pain. , Disp: , Rfl:    amLODipine (NORVASC) 2.5 MG tablet, Take 1 tablet (2.5 mg total) by mouth daily. 2.5 mg + 5 mg =7.5 mg total daily dose, Disp: , Rfl:    amLODipine (NORVASC) 5 MG  tablet, Take 1 tablet (5 mg total) by mouth daily. 2.5 mg + 5 mg =7.5 mg total daily dose, Disp: , Rfl: 11   B-D UF III MINI PEN NEEDLES 31G X 5 MM MISC, Inject into the skin 4 (four) times daily., Disp: , Rfl:    clotrimazole-betamethasone (LOTRISONE) cream, SMARTSIG:1 Sparingly Topical Twice Daily, Disp: , Rfl:    ergocalciferol (VITAMIN D2) 1.25 MG (50000 UT) capsule, Take 1 capsule (50,000 Units total) by mouth 2 (two) times a week. (Patient not taking: Reported on 09/16/2020), Disp: 24 capsule, Rfl: 2   fluocinonide cream (LIDEX) 8.78 %, Apply 1 application topically 2 (two) times daily as needed (to access port).  (Patient not taking: Reported on 03/24/2020), Disp: , Rfl:    guaiFENesin-dextromethorphan (ROBITUSSIN DM) 100-10 MG/5ML syrup, Take 5 mLs by mouth every 4 (four) hours as needed for cough. (Patient not taking: Reported on 03/24/2020), Disp: 118 mL, Rfl: 0   HUMALOG KWIKPEN 100 UNIT/ML KwikPen, Inject 1-5 Units into the skin See admin instructions. Sliding scale : up to 4 times a day., Disp: , Rfl:    loratadine (CLARITIN) 10 MG tablet, Take 1 tablet (10 mg total) by mouth See admin instructions. Patient states  he takes for 7 days: from 3rd day post chemo until 10th day post chemo (Patient not taking: Reported on 09/16/2020), Disp: 28 tablet, Rfl: 0   olmesartan-hydrochlorothiazide (BENICAR HCT) 40-25 MG tablet, Take 1 tablet by mouth daily., Disp: , Rfl:    pantoprazole (PROTONIX) 40 MG tablet, TAKE 1 TABLET BY MOUTH EVERY DAY BEFORE BREAKFAST, Disp: 90 tablet, Rfl: 1   rosuvastatin (CRESTOR) 10 MG tablet, Take 10 mg by mouth daily., Disp: , Rfl:    senna-docusate (SENNA S) 8.6-50 MG tablet, Take 2 tablets by mouth at bedtime as needed for mild constipation or moderate constipation. (Patient not taking: Reported on 03/24/2020), Disp: 60 tablet, Rfl: 1   traMADol (ULTRAM) 50 MG tablet, Take 1 tablet (50 mg total) by mouth every 6 (six) hours as needed., Disp: 60 tablet, Rfl: 0   TRESIBA  FLEXTOUCH 100 UNIT/ML FlexTouch Pen, Inject 40 Units into the skin daily. , Disp: , Rfl:   Allergies:  Allergies  Allergen Reactions   Penicillin G Benzathine Itching   Codeine Itching   Hydrocodone Nausea And Vomiting   Hydrocodone-Acetaminophen Nausea And Vomiting   Oxycodone Hcl Nausea And Vomiting   Penicillins Itching    Has patient had a PCN reaction causing immediate rash, facial/tongue/throat swelling, SOB or lightheadedness with hypotension: No Has patient had a PCN reaction causing severe rash involving mucus membranes or skin necrosis: No Has patient had a PCN reaction that required hospitalization No Has patient had a PCN reaction occurring within the last 10 years: No If all of the above answers are "NO", then may proceed with Cephalosporin use.    Percocet [Oxycodone-Acetaminophen] Nausea And Vomiting   Tape Itching and Rash     Paper Tape causes blisters; please use Coban wrap if possible    Past Medical History, Surgical history, Social history, and Family History were reviewed and updated.  Review of Systems: Review of Systems  Constitutional: Negative.   HENT:  Negative.    Eyes: Negative.   Respiratory: Negative.    Cardiovascular: Negative.   Gastrointestinal: Negative.   Endocrine: Negative.   Genitourinary: Negative.    Musculoskeletal: Negative.   Skin: Negative.   Neurological: Negative.   Hematological: Negative.   Psychiatric/Behavioral: Negative.      Physical Exam:  height is '5\' 10"'$  (1.778 m) and weight is 234 lb (106.1 kg). His oral temperature is 98.1 F (36.7 C). His blood pressure is 99/73 and his pulse is 89. His respiration is 18 and oxygen saturation is 100%.   Wt Readings from Last 3 Encounters:  05/23/22 234 lb (106.1 kg)  02/09/22 230 lb (104.3 kg)  11/07/21 223 lb (101.2 kg)    Physical Exam Vitals reviewed.  HENT:     Head: Normocephalic and atraumatic.  Eyes:     Pupils: Pupils are equal, round, and reactive to light.   Cardiovascular:     Rate and Rhythm: Normal rate and regular rhythm.     Heart sounds: Normal heart sounds.  Pulmonary:     Effort: Pulmonary effort is normal.     Breath sounds: Normal breath sounds.  Abdominal:     General: Bowel sounds are normal.     Palpations: Abdomen is soft.  Genitourinary:    Comments: His genital exam does show some erythema in the scrotal area.  He does have some erythema on the penile shaft.  I do not see any skin breakdown. Musculoskeletal:        General: No tenderness or deformity.  Normal range of motion.     Cervical back: Normal range of motion.  Lymphadenopathy:     Cervical: No cervical adenopathy.  Skin:    General: Skin is warm and dry.     Findings: No erythema or rash.  Neurological:     Mental Status: He is alert and oriented to person, place, and time.  Psychiatric:        Behavior: Behavior normal.        Thought Content: Thought content normal.        Judgment: Judgment normal.     Lab Results  Component Value Date   WBC 9.4 05/23/2022   HGB 12.6 (L) 05/23/2022   HCT 38.8 (L) 05/23/2022   MCV 89.2 05/23/2022   PLT 280 05/23/2022     Chemistry      Component Value Date/Time   NA 138 05/23/2022 0857   K 4.5 05/23/2022 0857   CL 102 05/23/2022 0857   CO2 28 05/23/2022 0857   BUN 34 (H) 05/23/2022 0857   CREATININE 1.67 (H) 05/23/2022 0857      Component Value Date/Time   CALCIUM 10.2 05/23/2022 0857   ALKPHOS 86 05/23/2022 0857   AST 19 05/23/2022 0857   ALT 20 05/23/2022 0857   BILITOT 0.5 05/23/2022 0857      Impression and Plan: Mr. Borner is a very nice 72 year old white male.  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  He had this removed in May.  He then underwent systemic chemotherapy along with intrathecal methotrexate.  He then underwent inguinal radiation therapy.  Again, he is trying to work through the radiation damage that he incurred in the genital area.  Hopefully, he will be able to  have normal functioning and hopefully he will be able to have relations with his fiance.  Again, I do not see any evidence of recurrence of his lymphoma.  I do not see that we have to do any scans on him.  I think that in the long run, his diabetes might be more of a problem for him.  I think he sees his endocrinologist in a week or so.  I think we cannot get him back in 4 months.  As always, I will stay strong and prayer for him.   Volanda Napoleon, MD 1/30/202410:11 AM

## 2022-05-24 ENCOUNTER — Other Ambulatory Visit: Payer: Self-pay

## 2022-05-24 MED ORDER — MIRTAZAPINE 15 MG PO TABS: 15.0000 mg | ORAL_TABLET | Freq: Every evening | ORAL | 0 refills | Status: DC | PRN

## 2022-05-25 LAB — ERYTHROPOIETIN: Erythropoietin: 6.9 m[IU]/mL (ref 2.6–18.5)

## 2022-06-02 DIAGNOSIS — I10 Essential (primary) hypertension: Secondary | ICD-10-CM | POA: Diagnosis not present

## 2022-06-02 DIAGNOSIS — E7800 Pure hypercholesterolemia, unspecified: Secondary | ICD-10-CM | POA: Diagnosis not present

## 2022-06-02 DIAGNOSIS — C8590 Non-Hodgkin lymphoma, unspecified, unspecified site: Secondary | ICD-10-CM | POA: Diagnosis not present

## 2022-06-02 DIAGNOSIS — E1165 Type 2 diabetes mellitus with hyperglycemia: Secondary | ICD-10-CM | POA: Diagnosis not present

## 2022-06-15 ENCOUNTER — Other Ambulatory Visit: Payer: Self-pay | Admitting: Hematology & Oncology

## 2022-07-25 DIAGNOSIS — I10 Essential (primary) hypertension: Secondary | ICD-10-CM | POA: Diagnosis not present

## 2022-07-25 DIAGNOSIS — U071 COVID-19: Secondary | ICD-10-CM | POA: Diagnosis not present

## 2022-08-07 DIAGNOSIS — E1165 Type 2 diabetes mellitus with hyperglycemia: Secondary | ICD-10-CM | POA: Diagnosis not present

## 2022-09-01 DIAGNOSIS — I10 Essential (primary) hypertension: Secondary | ICD-10-CM | POA: Diagnosis not present

## 2022-09-01 DIAGNOSIS — C8590 Non-Hodgkin lymphoma, unspecified, unspecified site: Secondary | ICD-10-CM | POA: Diagnosis not present

## 2022-09-01 DIAGNOSIS — E1165 Type 2 diabetes mellitus with hyperglycemia: Secondary | ICD-10-CM | POA: Diagnosis not present

## 2022-09-01 DIAGNOSIS — E7800 Pure hypercholesterolemia, unspecified: Secondary | ICD-10-CM | POA: Diagnosis not present

## 2022-09-12 ENCOUNTER — Other Ambulatory Visit: Payer: Self-pay | Admitting: Hematology & Oncology

## 2022-09-12 DIAGNOSIS — C8339 Diffuse large B-cell lymphoma, extranodal and solid organ sites: Secondary | ICD-10-CM

## 2022-09-22 ENCOUNTER — Inpatient Hospital Stay: Payer: Medicare Other | Attending: Hematology & Oncology

## 2022-09-22 ENCOUNTER — Encounter: Payer: Self-pay | Admitting: Hematology & Oncology

## 2022-09-22 ENCOUNTER — Inpatient Hospital Stay: Payer: Medicare Other | Admitting: Hematology & Oncology

## 2022-09-22 VITALS — BP 114/63 | HR 96 | Temp 97.8°F | Resp 20 | Ht 70.0 in | Wt 239.1 lb

## 2022-09-22 DIAGNOSIS — Z79899 Other long term (current) drug therapy: Secondary | ICD-10-CM | POA: Insufficient documentation

## 2022-09-22 DIAGNOSIS — Z885 Allergy status to narcotic agent status: Secondary | ICD-10-CM | POA: Diagnosis not present

## 2022-09-22 DIAGNOSIS — C8333 Diffuse large B-cell lymphoma, intra-abdominal lymph nodes: Secondary | ICD-10-CM

## 2022-09-22 DIAGNOSIS — Z880 Allergy status to penicillin: Secondary | ICD-10-CM | POA: Diagnosis not present

## 2022-09-22 DIAGNOSIS — C8339 Diffuse large B-cell lymphoma, extranodal and solid organ sites: Secondary | ICD-10-CM | POA: Diagnosis not present

## 2022-09-22 LAB — CBC WITH DIFFERENTIAL (CANCER CENTER ONLY)
Abs Immature Granulocytes: 0.03 10*3/uL (ref 0.00–0.07)
Basophils Absolute: 0.1 10*3/uL (ref 0.0–0.1)
Basophils Relative: 1 %
Eosinophils Absolute: 0.4 10*3/uL (ref 0.0–0.5)
Eosinophils Relative: 5 %
HCT: 37.7 % — ABNORMAL LOW (ref 39.0–52.0)
Hemoglobin: 12.2 g/dL — ABNORMAL LOW (ref 13.0–17.0)
Immature Granulocytes: 0 %
Lymphocytes Relative: 39 %
Lymphs Abs: 3.6 10*3/uL (ref 0.7–4.0)
MCH: 29.1 pg (ref 26.0–34.0)
MCHC: 32.4 g/dL (ref 30.0–36.0)
MCV: 90 fL (ref 80.0–100.0)
Monocytes Absolute: 0.9 10*3/uL (ref 0.1–1.0)
Monocytes Relative: 9 %
Neutro Abs: 4.2 10*3/uL (ref 1.7–7.7)
Neutrophils Relative %: 46 %
Platelet Count: 321 10*3/uL (ref 150–400)
RBC: 4.19 MIL/uL — ABNORMAL LOW (ref 4.22–5.81)
RDW: 14.4 % (ref 11.5–15.5)
WBC Count: 9.2 10*3/uL (ref 4.0–10.5)
nRBC: 0 % (ref 0.0–0.2)

## 2022-09-22 LAB — CMP (CANCER CENTER ONLY)
ALT: 25 U/L (ref 0–44)
AST: 27 U/L (ref 15–41)
Albumin: 4.8 g/dL (ref 3.5–5.0)
Alkaline Phosphatase: 86 U/L (ref 38–126)
Anion gap: 9 (ref 5–15)
BUN: 30 mg/dL — ABNORMAL HIGH (ref 8–23)
CO2: 27 mmol/L (ref 22–32)
Calcium: 10.3 mg/dL (ref 8.9–10.3)
Chloride: 103 mmol/L (ref 98–111)
Creatinine: 1.81 mg/dL — ABNORMAL HIGH (ref 0.61–1.24)
GFR, Estimated: 39 mL/min — ABNORMAL LOW (ref >60)
Glucose, Bld: 123 mg/dL — ABNORMAL HIGH (ref 70–99)
Potassium: 4.3 mmol/L (ref 3.5–5.1)
Sodium: 139 mmol/L (ref 135–145)
Total Bilirubin: 0.4 mg/dL (ref 0.3–1.2)
Total Protein: 7.9 g/dL (ref 6.5–8.1)

## 2022-09-22 LAB — LACTATE DEHYDROGENASE: LDH: 160 U/L (ref 98–192)

## 2022-09-22 NOTE — Progress Notes (Signed)
Hematology and Oncology Follow Up Visit  Patrick Bautista [id number] 1951/01/27 72 y.o. 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 Patrick Bautista is back for his follow-up.  We last saw him back in January.  Since then, he has been struggling with the changes that occurred after he had his radiation therapy.  He has tried to get explanations as to why he sustained some damage to his genital area.  Unfortunately, there really has not been an explanation for him.  I am not sure that there ever will be an explanation for him.  I know that he has been thinking about seeking some legal guidance regarding this.  I think that this probably would not work out in his favor.  He did go to [place].  He went with his fiance.  I did have a nice time.  He went for his birthday.  He actually went to the [employer] and won a Designer, fashion/clothing.  He does have a hard time do a lot of activities because of the burning in the genital area.  He is still going to the bathroom okay.  He has had no issues with cough or shortness of breath.  He has had no problems with nausea or vomiting.  There is been no bleeding.  Overall, I would say that his performance status is probably ECOG 1.      Medications:  Current Outpatient Medications:    acetaminophen (TYLENOL) 500 MG tablet, Take 500-1,000 mg by mouth every 6 (six) hours as needed for moderate pain. , Disp: , Rfl:    amLODipine (NORVASC) 2.5 MG tablet, Take 1 tablet (2.5 mg total) by mouth daily. 2.5 mg + 5 mg =7.5 mg total daily dose, Disp: , Rfl:    amLODipine (NORVASC) 5 MG tablet, Take 1 tablet (5 mg total) by mouth daily. 2.5 mg + 5 mg =7.5 mg total daily dose, Disp: , Rfl: 11   B-D UF III MINI PEN NEEDLES 31G X 5 MM MISC, Inject  into the skin 4 (four) times daily., Disp: , Rfl:    Continuous Glucose Receiver (FREESTYLE LIBRE 2 READER) DEVI, AS DIRECTED SUBCUTANEOUS ONCE A DAY 30 DAYS for 30, Disp: , Rfl:    HUMALOG KWIKPEN 100 UNIT/ML KwikPen, Inject 1-5 Units into the skin See admin instructions. Sliding scale : up to 4 times a day., Disp: , Rfl:    loratadine (CLARITIN) 10 MG tablet, 10 mg daily., Disp: , Rfl:    mirtazapine (REMERON) 15 MG tablet, TAKE 1 TABLET (15 MG TOTAL) BY MOUTH AT BEDTIME AS NEEDED., Disp: 90 tablet, Rfl: 1   olmesartan-hydrochlorothiazide (BENICAR HCT) 40-25 MG tablet, Take 1 tablet by mouth daily., Disp: , Rfl:    pantoprazole (PROTONIX) 40 MG tablet, TAKE 1 TABLET BY MOUTH EVERY DAY BEFORE BREAKFAST, Disp: 90 tablet, Rfl: 1   TRESIBA FLEXTOUCH 100 UNIT/ML FlexTouch Pen, Inject 40 Units into the skin daily. , Disp: , Rfl:    ergocalciferol (VITAMIN D2) 1.25 MG (50000 UT) capsule, Take 1 capsule (50,000 Units total) by mouth 2 (two) times a week. (Patient not taking: Reported on 09/16/2020), Disp: 24 capsule, Rfl: 2  Allergies:  Allergies  Allergen Reactions   Penicillin G Benzathine  Itching   Codeine Itching   Hydrocodone Nausea And Vomiting   Hydrocodone-Acetaminophen Nausea And Vomiting   Oxycodone Hcl Nausea And Vomiting   Penicillins Itching    Has patient had a PCN reaction causing immediate rash, facial/tongue/throat swelling, SOB or lightheadedness with hypotension: No Has patient had a PCN reaction causing severe rash involving mucus membranes or skin necrosis: No Has patient had a PCN reaction that required hospitalization No Has patient had a PCN reaction occurring within the last 10 years: No If all of the above answers are "NO", then may proceed with Cephalosporin use.    Percocet [Oxycodone-Acetaminophen] Nausea And Vomiting   Tape Itching and Rash     Paper Tape causes blisters; please use Coban wrap if possible    Past Medical History, Surgical history, Social history,  and Family History were reviewed and updated.  Review of Systems: Review of Systems  Constitutional: Negative.   HENT:  Negative.    Eyes: Negative.   Respiratory: Negative.    Cardiovascular: Negative.   Gastrointestinal: Negative.   Endocrine: Negative.   Genitourinary: Negative.    Musculoskeletal: Negative.   Skin: Negative.   Neurological: Negative.   Hematological: Negative.   Psychiatric/Behavioral: Negative.      Physical Exam:  height is 5\' 10"  (1.778 m) and weight is 239 lb 1.3 oz (108.4 kg). His oral temperature is 97.8 F (36.6 C). His blood pressure is 114/63 and his pulse is 96. His respiration is 20 and oxygen saturation is 99%.   Wt Readings from Last 3 Encounters:  09/22/22 239 lb 1.3 oz (108.4 kg)  05/23/22 234 lb (106.1 kg)  02/09/22 230 lb (104.3 kg)    Physical Exam Vitals reviewed.  HENT:     Head: Normocephalic and atraumatic.  Eyes:     Pupils: Pupils are equal, round, and reactive to light.  Cardiovascular:     Rate and Rhythm: Normal rate and regular rhythm.     Heart sounds: Normal heart sounds.  Pulmonary:     Effort: Pulmonary effort is normal.     Breath sounds: Normal breath sounds.  Abdominal:     General: Bowel sounds are normal.     Palpations: Abdomen is soft.  Genitourinary:    Comments: His genital exam does show some erythema in the scrotal area.  He does have some erythema on the penile shaft.  I do not see any skin breakdown. Musculoskeletal:        General: No tenderness or deformity. Normal range of motion.     Cervical back: Normal range of motion.  Lymphadenopathy:     Cervical: No cervical adenopathy.  Skin:    General: Skin is warm and dry.     Findings: No erythema or rash.  Neurological:     Mental Status: He is alert and oriented to person, place, and time.  Psychiatric:        Behavior: Behavior normal.        Thought Content: Thought content normal.        Judgment: Judgment normal.     Lab Results   Component Value Date   WBC 9.2 09/22/2022   HGB 12.2 (L) 09/22/2022   HCT 37.7 (L) 09/22/2022   MCV 90.0 09/22/2022   PLT 321 09/22/2022     Chemistry      Component Value Date/Time   NA 139 09/22/2022 0912   K 4.3 09/22/2022 0912   CL 103 09/22/2022 0912   CO2 27 09/22/2022 0912  BUN 30 (H) 09/22/2022 0912   CREATININE 1.81 (H) 09/22/2022 0912      Component Value Date/Time   CALCIUM 10.3 09/22/2022 0912   ALKPHOS 86 09/22/2022 0912   AST 27 09/22/2022 0912   ALT 25 09/22/2022 0912   BILITOT 0.4 09/22/2022 0912      Impression and Plan: Mr. Disbrow is a very nice 72 year old white male.  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  He had this removed in May.  He then underwent systemic chemotherapy along with intrathecal methotrexate.  He then underwent inguinal radiation therapy.  Again, he is trying to work through the radiation damage that he incurred in the genital area.  I know that this has been incredibly challenging for him.  This is been so stressful on him.  Thankfully, I do not see any issues with lymphoma coming back.  His blood sugars are looking quite good.  Maybe, his diabetes is improving.  We will now get him through the Summer.  I will see him back in the Fall.    As always, I will stay strong and prayer for him.   Josph Macho, MD 5/31/202410:04 AM

## 2022-09-28 DIAGNOSIS — C44622 Squamous cell carcinoma of skin of right upper limb, including shoulder: Secondary | ICD-10-CM | POA: Diagnosis not present

## 2022-09-28 DIAGNOSIS — Z85828 Personal history of other malignant neoplasm of skin: Secondary | ICD-10-CM | POA: Diagnosis not present

## 2022-09-28 DIAGNOSIS — L812 Freckles: Secondary | ICD-10-CM | POA: Diagnosis not present

## 2022-09-28 DIAGNOSIS — D0421 Carcinoma in situ of skin of right ear and external auricular canal: Secondary | ICD-10-CM | POA: Diagnosis not present

## 2022-09-28 DIAGNOSIS — D1801 Hemangioma of skin and subcutaneous tissue: Secondary | ICD-10-CM | POA: Diagnosis not present

## 2022-09-28 DIAGNOSIS — L570 Actinic keratosis: Secondary | ICD-10-CM | POA: Diagnosis not present

## 2022-10-25 DIAGNOSIS — E1165 Type 2 diabetes mellitus with hyperglycemia: Secondary | ICD-10-CM | POA: Diagnosis not present

## 2022-12-19 ENCOUNTER — Other Ambulatory Visit: Payer: Self-pay | Admitting: Hematology & Oncology

## 2022-12-27 DIAGNOSIS — E1165 Type 2 diabetes mellitus with hyperglycemia: Secondary | ICD-10-CM | POA: Diagnosis not present

## 2022-12-27 DIAGNOSIS — E7800 Pure hypercholesterolemia, unspecified: Secondary | ICD-10-CM | POA: Diagnosis not present

## 2023-01-02 DIAGNOSIS — M542 Cervicalgia: Secondary | ICD-10-CM | POA: Diagnosis not present

## 2023-01-02 DIAGNOSIS — M62830 Muscle spasm of back: Secondary | ICD-10-CM | POA: Diagnosis not present

## 2023-01-02 DIAGNOSIS — M9903 Segmental and somatic dysfunction of lumbar region: Secondary | ICD-10-CM | POA: Diagnosis not present

## 2023-01-02 DIAGNOSIS — M9902 Segmental and somatic dysfunction of thoracic region: Secondary | ICD-10-CM | POA: Diagnosis not present

## 2023-01-03 DIAGNOSIS — M62830 Muscle spasm of back: Secondary | ICD-10-CM | POA: Diagnosis not present

## 2023-01-03 DIAGNOSIS — E7800 Pure hypercholesterolemia, unspecified: Secondary | ICD-10-CM | POA: Diagnosis not present

## 2023-01-03 DIAGNOSIS — I10 Essential (primary) hypertension: Secondary | ICD-10-CM | POA: Diagnosis not present

## 2023-01-03 DIAGNOSIS — M542 Cervicalgia: Secondary | ICD-10-CM | POA: Diagnosis not present

## 2023-01-03 DIAGNOSIS — C8590 Non-Hodgkin lymphoma, unspecified, unspecified site: Secondary | ICD-10-CM | POA: Diagnosis not present

## 2023-01-03 DIAGNOSIS — E1165 Type 2 diabetes mellitus with hyperglycemia: Secondary | ICD-10-CM | POA: Diagnosis not present

## 2023-01-03 DIAGNOSIS — M9903 Segmental and somatic dysfunction of lumbar region: Secondary | ICD-10-CM | POA: Diagnosis not present

## 2023-01-03 DIAGNOSIS — M9902 Segmental and somatic dysfunction of thoracic region: Secondary | ICD-10-CM | POA: Diagnosis not present

## 2023-01-09 DIAGNOSIS — M542 Cervicalgia: Secondary | ICD-10-CM | POA: Diagnosis not present

## 2023-01-09 DIAGNOSIS — M9903 Segmental and somatic dysfunction of lumbar region: Secondary | ICD-10-CM | POA: Diagnosis not present

## 2023-01-09 DIAGNOSIS — M62830 Muscle spasm of back: Secondary | ICD-10-CM | POA: Diagnosis not present

## 2023-01-09 DIAGNOSIS — M9902 Segmental and somatic dysfunction of thoracic region: Secondary | ICD-10-CM | POA: Diagnosis not present

## 2023-01-11 DIAGNOSIS — M9903 Segmental and somatic dysfunction of lumbar region: Secondary | ICD-10-CM | POA: Diagnosis not present

## 2023-01-11 DIAGNOSIS — M9902 Segmental and somatic dysfunction of thoracic region: Secondary | ICD-10-CM | POA: Diagnosis not present

## 2023-01-11 DIAGNOSIS — M542 Cervicalgia: Secondary | ICD-10-CM | POA: Diagnosis not present

## 2023-01-11 DIAGNOSIS — M62830 Muscle spasm of back: Secondary | ICD-10-CM | POA: Diagnosis not present

## 2023-01-15 DIAGNOSIS — M542 Cervicalgia: Secondary | ICD-10-CM | POA: Diagnosis not present

## 2023-01-15 DIAGNOSIS — M9903 Segmental and somatic dysfunction of lumbar region: Secondary | ICD-10-CM | POA: Diagnosis not present

## 2023-01-15 DIAGNOSIS — M9902 Segmental and somatic dysfunction of thoracic region: Secondary | ICD-10-CM | POA: Diagnosis not present

## 2023-01-15 DIAGNOSIS — M62830 Muscle spasm of back: Secondary | ICD-10-CM | POA: Diagnosis not present

## 2023-01-19 DIAGNOSIS — M25511 Pain in right shoulder: Secondary | ICD-10-CM | POA: Diagnosis not present

## 2023-01-20 DIAGNOSIS — Z794 Long term (current) use of insulin: Secondary | ICD-10-CM | POA: Diagnosis not present

## 2023-01-20 DIAGNOSIS — E1165 Type 2 diabetes mellitus with hyperglycemia: Secondary | ICD-10-CM | POA: Diagnosis not present

## 2023-02-05 ENCOUNTER — Encounter: Payer: Self-pay | Admitting: Hematology

## 2023-02-05 NOTE — Telephone Encounter (Signed)
error 

## 2023-02-19 ENCOUNTER — Inpatient Hospital Stay: Payer: Medicare Other | Attending: Hematology & Oncology

## 2023-02-19 ENCOUNTER — Inpatient Hospital Stay: Payer: Medicare Other | Admitting: Hematology & Oncology

## 2023-02-19 VITALS — BP 123/67 | HR 92 | Temp 98.2°F | Resp 17 | Ht 70.0 in | Wt 239.0 lb

## 2023-02-19 DIAGNOSIS — Z79899 Other long term (current) drug therapy: Secondary | ICD-10-CM | POA: Diagnosis not present

## 2023-02-19 DIAGNOSIS — Z880 Allergy status to penicillin: Secondary | ICD-10-CM | POA: Diagnosis not present

## 2023-02-19 DIAGNOSIS — T2106XA Burn of unspecified degree of male genital region, initial encounter: Secondary | ICD-10-CM | POA: Diagnosis present

## 2023-02-19 DIAGNOSIS — Z885 Allergy status to narcotic agent status: Secondary | ICD-10-CM | POA: Diagnosis not present

## 2023-02-19 DIAGNOSIS — C8333 Diffuse large B-cell lymphoma, intra-abdominal lymph nodes: Secondary | ICD-10-CM

## 2023-02-19 DIAGNOSIS — C8335 Diffuse large B-cell lymphoma, lymph nodes of inguinal region and lower limb: Secondary | ICD-10-CM | POA: Diagnosis present

## 2023-02-19 LAB — CBC WITH DIFFERENTIAL (CANCER CENTER ONLY)
Abs Immature Granulocytes: 0.02 10*3/uL (ref 0.00–0.07)
Basophils Absolute: 0 10*3/uL (ref 0.0–0.1)
Basophils Relative: 0 %
Eosinophils Absolute: 0.3 10*3/uL (ref 0.0–0.5)
Eosinophils Relative: 4 %
HCT: 38.7 % — ABNORMAL LOW (ref 39.0–52.0)
Hemoglobin: 12.7 g/dL — ABNORMAL LOW (ref 13.0–17.0)
Immature Granulocytes: 0 %
Lymphocytes Relative: 33 %
Lymphs Abs: 2.7 10*3/uL (ref 0.7–4.0)
MCH: 29.2 pg (ref 26.0–34.0)
MCHC: 32.8 g/dL (ref 30.0–36.0)
MCV: 89 fL (ref 80.0–100.0)
Monocytes Absolute: 0.9 10*3/uL (ref 0.1–1.0)
Monocytes Relative: 11 %
Neutro Abs: 4.3 10*3/uL (ref 1.7–7.7)
Neutrophils Relative %: 52 %
Platelet Count: 292 10*3/uL (ref 150–400)
RBC: 4.35 MIL/uL (ref 4.22–5.81)
RDW: 13.9 % (ref 11.5–15.5)
WBC Count: 8.2 10*3/uL (ref 4.0–10.5)
nRBC: 0 % (ref 0.0–0.2)

## 2023-02-19 LAB — CMP (CANCER CENTER ONLY)
ALT: 28 U/L (ref 0–44)
AST: 27 U/L (ref 15–41)
Albumin: 4.7 g/dL (ref 3.5–5.0)
Alkaline Phosphatase: 96 U/L (ref 38–126)
Anion gap: 10 (ref 5–15)
BUN: 29 mg/dL — ABNORMAL HIGH (ref 8–23)
CO2: 27 mmol/L (ref 22–32)
Calcium: 10 mg/dL (ref 8.9–10.3)
Chloride: 102 mmol/L (ref 98–111)
Creatinine: 1.69 mg/dL — ABNORMAL HIGH (ref 0.61–1.24)
GFR, Estimated: 42 mL/min — ABNORMAL LOW (ref >60)
Glucose, Bld: 224 mg/dL — ABNORMAL HIGH (ref 70–99)
Potassium: 4.5 mmol/L (ref 3.5–5.1)
Sodium: 139 mmol/L (ref 135–145)
Total Bilirubin: 0.4 mg/dL (ref 0.3–1.2)
Total Protein: 7.2 g/dL (ref 6.5–8.1)

## 2023-02-19 LAB — LACTATE DEHYDROGENASE: LDH: 147 U/L (ref 98–192)
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
## 2023-03-01 DIAGNOSIS — M25511 Pain in right shoulder: Secondary | ICD-10-CM | POA: Diagnosis not present

## 2023-03-07 ENCOUNTER — Other Ambulatory Visit: Payer: Self-pay | Admitting: Hematology & Oncology

## 2023-03-07 DIAGNOSIS — C83398 Diffuse large b-cell lymphoma of other extranodal and solid organ sites: Secondary | ICD-10-CM

## 2023-04-02 DIAGNOSIS — L570 Actinic keratosis: Secondary | ICD-10-CM | POA: Diagnosis not present

## 2023-04-02 DIAGNOSIS — Z85828 Personal history of other malignant neoplasm of skin: Secondary | ICD-10-CM | POA: Diagnosis not present

## 2023-04-03 DIAGNOSIS — E7800 Pure hypercholesterolemia, unspecified: Secondary | ICD-10-CM | POA: Diagnosis not present

## 2023-04-03 DIAGNOSIS — E1165 Type 2 diabetes mellitus with hyperglycemia: Secondary | ICD-10-CM | POA: Diagnosis not present

## 2023-04-10 DIAGNOSIS — C8590 Non-Hodgkin lymphoma, unspecified, unspecified site: Secondary | ICD-10-CM | POA: Diagnosis not present

## 2023-04-10 DIAGNOSIS — I10 Essential (primary) hypertension: Secondary | ICD-10-CM | POA: Diagnosis not present

## 2023-04-10 DIAGNOSIS — E7800 Pure hypercholesterolemia, unspecified: Secondary | ICD-10-CM | POA: Diagnosis not present

## 2023-04-10 DIAGNOSIS — E1165 Type 2 diabetes mellitus with hyperglycemia: Secondary | ICD-10-CM | POA: Diagnosis not present

## 2023-06-05 ENCOUNTER — Other Ambulatory Visit: Payer: Self-pay | Admitting: Hematology & Oncology

## 2023-08-20 ENCOUNTER — Encounter: Payer: Self-pay | Admitting: Hematology & Oncology

## 2023-08-20 ENCOUNTER — Inpatient Hospital Stay: Payer: Medicare Other | Admitting: Hematology & Oncology

## 2023-08-20 ENCOUNTER — Inpatient Hospital Stay: Payer: Medicare Other | Attending: Hematology & Oncology

## 2023-08-20 VITALS — BP 122/76 | HR 88 | Temp 97.9°F | Resp 20 | Ht 70.0 in | Wt 241.0 lb

## 2023-08-20 DIAGNOSIS — T2106XA Burn of unspecified degree of male genital region, initial encounter: Secondary | ICD-10-CM | POA: Diagnosis present

## 2023-08-20 DIAGNOSIS — C8335 Diffuse large B-cell lymphoma, lymph nodes of inguinal region and lower limb: Secondary | ICD-10-CM | POA: Diagnosis present

## 2023-08-20 DIAGNOSIS — Z79899 Other long term (current) drug therapy: Secondary | ICD-10-CM | POA: Diagnosis present

## 2023-08-20 DIAGNOSIS — C8338 Diffuse large B-cell lymphoma, lymph nodes of multiple sites: Secondary | ICD-10-CM

## 2023-08-20 DIAGNOSIS — Z923 Personal history of irradiation: Secondary | ICD-10-CM | POA: Diagnosis not present

## 2023-08-20 DIAGNOSIS — Z885 Allergy status to narcotic agent status: Secondary | ICD-10-CM | POA: Diagnosis not present

## 2023-08-20 DIAGNOSIS — Z880 Allergy status to penicillin: Secondary | ICD-10-CM | POA: Insufficient documentation

## 2023-08-20 DIAGNOSIS — C8333 Diffuse large B-cell lymphoma, intra-abdominal lymph nodes: Secondary | ICD-10-CM

## 2023-08-20 LAB — CBC WITH DIFFERENTIAL (CANCER CENTER ONLY)
Abs Immature Granulocytes: 0.04 10*3/uL (ref 0.00–0.07)
Basophils Absolute: 0 10*3/uL (ref 0.0–0.1)
Basophils Relative: 0 %
Eosinophils Absolute: 0.3 10*3/uL (ref 0.0–0.5)
Eosinophils Relative: 2 %
HCT: 38.8 % — ABNORMAL LOW (ref 39.0–52.0)
Hemoglobin: 12.6 g/dL — ABNORMAL LOW (ref 13.0–17.0)
Immature Granulocytes: 0 %
Lymphocytes Relative: 37 %
Lymphs Abs: 4.4 10*3/uL — ABNORMAL HIGH (ref 0.7–4.0)
MCH: 29.3 pg (ref 26.0–34.0)
MCHC: 32.5 g/dL (ref 30.0–36.0)
MCV: 90.2 fL (ref 80.0–100.0)
Monocytes Absolute: 0.8 10*3/uL (ref 0.1–1.0)
Monocytes Relative: 7 %
Neutro Abs: 6.4 10*3/uL (ref 1.7–7.7)
Neutrophils Relative %: 54 %
Platelet Count: 287 10*3/uL (ref 150–400)
RBC: 4.3 MIL/uL (ref 4.22–5.81)
RDW: 14.5 % (ref 11.5–15.5)
WBC Count: 12 10*3/uL — ABNORMAL HIGH (ref 4.0–10.5)
nRBC: 0 % (ref 0.0–0.2)

## 2023-08-20 LAB — CMP (CANCER CENTER ONLY)
ALT: 32 U/L (ref 0–44)
AST: 33 U/L (ref 15–41)
Albumin: 4.6 g/dL (ref 3.5–5.0)
Alkaline Phosphatase: 104 U/L (ref 38–126)
Anion gap: 14 (ref 5–15)
BUN: 32 mg/dL — ABNORMAL HIGH (ref 8–23)
CO2: 23 mmol/L (ref 22–32)
Calcium: 10.2 mg/dL (ref 8.9–10.3)
Chloride: 104 mmol/L (ref 98–111)
Creatinine: 1.89 mg/dL — ABNORMAL HIGH (ref 0.61–1.24)
GFR, Estimated: 37 mL/min — ABNORMAL LOW (ref >60)
Glucose, Bld: 114 mg/dL — ABNORMAL HIGH (ref 70–99)
Potassium: 5.1 mmol/L (ref 3.5–5.1)
Sodium: 140 mmol/L (ref 135–145)
Total Bilirubin: 0.3 mg/dL (ref 0.0–1.2)
Total Protein: 7.8 g/dL (ref 6.5–8.1)

## 2023-08-20 LAB — LACTATE DEHYDROGENASE: LDH: 170 U/L (ref 98–192)
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
## 2023-09-02 ENCOUNTER — Other Ambulatory Visit: Payer: Self-pay | Admitting: Hematology & Oncology

## 2023-09-02 DIAGNOSIS — C83398 Diffuse large b-cell lymphoma of other extranodal and solid organ sites: Secondary | ICD-10-CM

## 2023-09-03 ENCOUNTER — Encounter: Payer: Self-pay | Admitting: Hematology

## 2023-10-18 ENCOUNTER — Other Ambulatory Visit: Payer: Self-pay | Admitting: Hematology & Oncology

## 2023-10-19 ENCOUNTER — Encounter: Payer: Self-pay | Admitting: Hematology

## 2024-02-19 ENCOUNTER — Encounter: Payer: Self-pay | Admitting: Hematology & Oncology

## 2024-02-19 ENCOUNTER — Inpatient Hospital Stay: Admitting: Hematology & Oncology

## 2024-02-19 ENCOUNTER — Inpatient Hospital Stay: Attending: Hematology & Oncology

## 2024-02-19 VITALS — BP 120/77 | HR 91 | Temp 97.9°F | Resp 20 | Ht 70.0 in | Wt 241.8 lb

## 2024-02-19 DIAGNOSIS — Z885 Allergy status to narcotic agent status: Secondary | ICD-10-CM | POA: Insufficient documentation

## 2024-02-19 DIAGNOSIS — Z79899 Other long term (current) drug therapy: Secondary | ICD-10-CM | POA: Insufficient documentation

## 2024-02-19 DIAGNOSIS — Y842 Radiological procedure and radiotherapy as the cause of abnormal reaction of the patient, or of later complication, without mention of misadventure at the time of the procedure: Secondary | ICD-10-CM | POA: Insufficient documentation

## 2024-02-19 DIAGNOSIS — Z923 Personal history of irradiation: Secondary | ICD-10-CM | POA: Diagnosis not present

## 2024-02-19 DIAGNOSIS — Z880 Allergy status to penicillin: Secondary | ICD-10-CM | POA: Insufficient documentation

## 2024-02-19 DIAGNOSIS — C8330 Diffuse large B-cell lymphoma, unspecified site: Secondary | ICD-10-CM

## 2024-02-19 DIAGNOSIS — C8338 Diffuse large B-cell lymphoma, lymph nodes of multiple sites: Secondary | ICD-10-CM

## 2024-02-19 DIAGNOSIS — E119 Type 2 diabetes mellitus without complications: Secondary | ICD-10-CM | POA: Diagnosis not present

## 2024-02-19 DIAGNOSIS — C8335 Diffuse large B-cell lymphoma, lymph nodes of inguinal region and lower limb: Secondary | ICD-10-CM | POA: Diagnosis present

## 2024-02-19 LAB — CBC WITH DIFFERENTIAL (CANCER CENTER ONLY)
Abs Immature Granulocytes: 0.09 K/uL — ABNORMAL HIGH (ref 0.00–0.07)
Basophils Absolute: 0.1 K/uL (ref 0.0–0.1)
Basophils Relative: 1 %
Eosinophils Absolute: 0.3 K/uL (ref 0.0–0.5)
Eosinophils Relative: 2 %
HCT: 38.8 % — ABNORMAL LOW (ref 39.0–52.0)
Hemoglobin: 12.7 g/dL — ABNORMAL LOW (ref 13.0–17.0)
Immature Granulocytes: 1 %
Lymphocytes Relative: 35 %
Lymphs Abs: 4 K/uL (ref 0.7–4.0)
MCH: 29.1 pg (ref 26.0–34.0)
MCHC: 32.7 g/dL (ref 30.0–36.0)
MCV: 89 fL (ref 80.0–100.0)
Monocytes Absolute: 0.9 K/uL (ref 0.1–1.0)
Monocytes Relative: 8 %
Neutro Abs: 6.3 K/uL (ref 1.7–7.7)
Neutrophils Relative %: 53 %
Platelet Count: 221 K/uL (ref 150–400)
RBC: 4.36 MIL/uL (ref 4.22–5.81)
RDW: 14.2 % (ref 11.5–15.5)
WBC Count: 11.6 K/uL — ABNORMAL HIGH (ref 4.0–10.5)
nRBC: 0 % (ref 0.0–0.2)

## 2024-02-19 LAB — CMP (CANCER CENTER ONLY)
ALT: 22 U/L (ref 0–44)
AST: 24 U/L (ref 15–41)
Albumin: 4.4 g/dL (ref 3.5–5.0)
Alkaline Phosphatase: 109 U/L (ref 38–126)
Anion gap: 13 (ref 5–15)
BUN: 31 mg/dL — ABNORMAL HIGH (ref 8–23)
CO2: 24 mmol/L (ref 22–32)
Calcium: 9.7 mg/dL (ref 8.9–10.3)
Chloride: 100 mmol/L (ref 98–111)
Creatinine: 1.56 mg/dL — ABNORMAL HIGH (ref 0.61–1.24)
GFR, Estimated: 46 mL/min — ABNORMAL LOW (ref >60)
Glucose, Bld: 152 mg/dL — ABNORMAL HIGH (ref 70–99)
Potassium: 4.5 mmol/L (ref 3.5–5.1)
Sodium: 137 mmol/L (ref 135–145)
Total Bilirubin: 0.4 mg/dL (ref 0.0–1.2)
Total Protein: 7.4 g/dL (ref 6.5–8.1)

## 2024-02-19 LAB — LACTATE DEHYDROGENASE: LDH: 157 U/L (ref 98–192)
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
## 2024-02-26 ENCOUNTER — Other Ambulatory Visit: Payer: Self-pay | Admitting: Hematology & Oncology

## 2024-02-26 DIAGNOSIS — C83398 Diffuse large b-cell lymphoma of other extranodal and solid organ sites: Secondary | ICD-10-CM

## 2024-07-07 ENCOUNTER — Inpatient Hospital Stay: Admitting: Hematology & Oncology

## 2024-07-07 ENCOUNTER — Inpatient Hospital Stay
